# Patient Record
Sex: Male | Born: 1955 | Race: White | Hispanic: No | Marital: Married | Smoking: Current some day smoker
Health system: Southern US, Community
[De-identification: ages and names within clinical notes are randomized; demographics above are authoritative.]

## PROBLEM LIST (undated history)

## (undated) DIAGNOSIS — N400 Enlarged prostate without lower urinary tract symptoms: Secondary | ICD-10-CM

## (undated) DIAGNOSIS — I10 Essential (primary) hypertension: Secondary | ICD-10-CM

## (undated) DIAGNOSIS — C801 Malignant (primary) neoplasm, unspecified: Secondary | ICD-10-CM

## (undated) DIAGNOSIS — M1990 Unspecified osteoarthritis, unspecified site: Secondary | ICD-10-CM

## (undated) DIAGNOSIS — F329 Major depressive disorder, single episode, unspecified: Secondary | ICD-10-CM

## (undated) DIAGNOSIS — F32A Depression, unspecified: Secondary | ICD-10-CM

## (undated) DIAGNOSIS — C61 Malignant neoplasm of prostate: Secondary | ICD-10-CM

## (undated) DIAGNOSIS — J189 Pneumonia, unspecified organism: Secondary | ICD-10-CM

## (undated) HISTORY — DX: Malignant neoplasm of prostate: C61

## (undated) HISTORY — DX: Benign prostatic hyperplasia without lower urinary tract symptoms: N40.0

## (undated) HISTORY — DX: Unspecified osteoarthritis, unspecified site: M19.90

---

## 2008-12-23 ENCOUNTER — Ambulatory Visit (HOSPITAL_COMMUNITY): Admission: RE | Admit: 2008-12-23 | Discharge: 2008-12-23 | Payer: Self-pay | Admitting: Urology

## 2008-12-31 ENCOUNTER — Ambulatory Visit: Admission: RE | Admit: 2008-12-31 | Discharge: 2009-01-29 | Payer: Self-pay | Admitting: Radiation Oncology

## 2008-12-31 ENCOUNTER — Ambulatory Visit: Payer: Self-pay | Admitting: Oncology

## 2009-01-09 LAB — CBC WITH DIFFERENTIAL/PLATELET
BASO%: 0.3 % (ref 0.0–2.0)
Basophils Absolute: 0 10*3/uL (ref 0.0–0.1)
EOS%: 3.2 % (ref 0.0–7.0)
Eosinophils Absolute: 0.2 10*3/uL (ref 0.0–0.5)
HCT: 41.5 % (ref 38.4–49.9)
HGB: 14.9 g/dL (ref 13.0–17.1)
LYMPH%: 25.6 % (ref 14.0–49.0)
MCH: 30.4 pg (ref 27.2–33.4)
MCHC: 35.9 g/dL (ref 32.0–36.0)
MCV: 84.7 fL (ref 79.3–98.0)
MONO#: 0.3 10*3/uL (ref 0.1–0.9)
MONO%: 5.2 % (ref 0.0–14.0)
NEUT#: 3.6 10*3/uL (ref 1.5–6.5)
NEUT%: 65.7 % (ref 39.0–75.0)
Platelets: 118 10*3/uL — ABNORMAL LOW (ref 140–400)
RBC: 4.9 10*6/uL (ref 4.20–5.82)
RDW: 12.8 % (ref 11.0–14.6)
WBC: 5.4 10*3/uL (ref 4.0–10.3)
lymph#: 1.4 10*3/uL (ref 0.9–3.3)

## 2009-01-09 LAB — COMPREHENSIVE METABOLIC PANEL
ALT: 31 U/L (ref 0–53)
AST: 18 U/L (ref 0–37)
Albumin: 4.5 g/dL (ref 3.5–5.2)
Alkaline Phosphatase: 98 U/L (ref 39–117)
BUN: 18 mg/dL (ref 6–23)
CO2: 25 mEq/L (ref 19–32)
Calcium: 9.8 mg/dL (ref 8.4–10.5)
Chloride: 104 mEq/L (ref 96–112)
Creatinine, Ser: 0.94 mg/dL (ref 0.40–1.50)
Glucose, Bld: 97 mg/dL (ref 70–99)
Potassium: 4.2 mEq/L (ref 3.5–5.3)
Sodium: 141 mEq/L (ref 135–145)
Total Bilirubin: 0.5 mg/dL (ref 0.3–1.2)
Total Protein: 6.9 g/dL (ref 6.0–8.3)

## 2009-01-09 LAB — PSA: PSA: 28.07 ng/mL — ABNORMAL HIGH (ref 0.10–4.00)

## 2009-01-09 LAB — TESTOSTERONE: Testosterone: 266.33 ng/dL — ABNORMAL LOW (ref 350–890)

## 2009-07-07 ENCOUNTER — Ambulatory Visit: Payer: Self-pay | Admitting: Oncology

## 2011-02-09 ENCOUNTER — Other Ambulatory Visit (HOSPITAL_COMMUNITY): Payer: Self-pay | Admitting: Urology

## 2011-02-09 DIAGNOSIS — C61 Malignant neoplasm of prostate: Secondary | ICD-10-CM

## 2011-02-23 ENCOUNTER — Encounter (HOSPITAL_COMMUNITY): Payer: Self-pay

## 2011-02-23 ENCOUNTER — Encounter (HOSPITAL_COMMUNITY)
Admission: RE | Admit: 2011-02-23 | Discharge: 2011-02-23 | Disposition: A | Discharge status: Home / Self Care | Payer: PRIVATE HEALTH INSURANCE | Source: Ambulatory Visit | Attending: Urology | Admitting: Urology

## 2011-02-23 DIAGNOSIS — M545 Low back pain, unspecified: Secondary | ICD-10-CM | POA: Insufficient documentation

## 2011-02-23 DIAGNOSIS — C7952 Secondary malignant neoplasm of bone marrow: Secondary | ICD-10-CM | POA: Insufficient documentation

## 2011-02-23 DIAGNOSIS — C7951 Secondary malignant neoplasm of bone: Secondary | ICD-10-CM | POA: Insufficient documentation

## 2011-02-23 DIAGNOSIS — C61 Malignant neoplasm of prostate: Secondary | ICD-10-CM

## 2011-02-23 HISTORY — DX: Malignant (primary) neoplasm, unspecified: C80.1

## 2011-02-23 MED ORDER — TECHNETIUM TC 99M MEDRONATE IV KIT
25.0000 | PACK | Freq: Once | INTRAVENOUS | Status: AC | PRN
  Administered 2011-02-23: 23.1 via INTRAVENOUS

## 2011-11-19 ENCOUNTER — Other Ambulatory Visit (HOSPITAL_COMMUNITY): Payer: Self-pay | Admitting: Urology

## 2011-11-19 DIAGNOSIS — C61 Malignant neoplasm of prostate: Secondary | ICD-10-CM

## 2011-12-17 ENCOUNTER — Encounter (HOSPITAL_COMMUNITY)
Admission: RE | Admit: 2011-12-17 | Discharge: 2011-12-17 | Disposition: A | Discharge status: Home / Self Care | Payer: Managed Care, Other (non HMO) | Source: Ambulatory Visit | Attending: Urology | Admitting: Urology

## 2011-12-17 ENCOUNTER — Encounter (HOSPITAL_COMMUNITY): Payer: Self-pay

## 2011-12-17 DIAGNOSIS — C7951 Secondary malignant neoplasm of bone: Secondary | ICD-10-CM | POA: Insufficient documentation

## 2011-12-17 DIAGNOSIS — C61 Malignant neoplasm of prostate: Secondary | ICD-10-CM | POA: Insufficient documentation

## 2011-12-17 MED ORDER — TECHNETIUM TC 99M MEDRONATE IV KIT
23.3000 | PACK | Freq: Once | INTRAVENOUS | Status: AC | PRN
  Administered 2011-12-17: 23.3 via INTRAVENOUS

## 2012-01-11 ENCOUNTER — Telehealth: Payer: Self-pay | Admitting: Oncology

## 2012-01-11 NOTE — Telephone Encounter (Signed)
S/w pt re appt for 2/28 @ 1:30 pm. Jeanene Erb [phone].

## 2012-01-12 ENCOUNTER — Other Ambulatory Visit: Payer: Self-pay | Admitting: Oncology

## 2012-01-12 DIAGNOSIS — C61 Malignant neoplasm of prostate: Secondary | ICD-10-CM

## 2012-01-13 ENCOUNTER — Ambulatory Visit (HOSPITAL_BASED_OUTPATIENT_CLINIC_OR_DEPARTMENT_OTHER): Payer: Managed Care, Other (non HMO) | Admitting: Oncology

## 2012-01-13 ENCOUNTER — Other Ambulatory Visit: Payer: Managed Care, Other (non HMO)

## 2012-01-13 ENCOUNTER — Ambulatory Visit (HOSPITAL_BASED_OUTPATIENT_CLINIC_OR_DEPARTMENT_OTHER): Payer: Managed Care, Other (non HMO)

## 2012-01-13 VITALS — BP 130/73 | HR 68 | Temp 97.3°F | Ht 74.0 in | Wt 211.4 lb

## 2012-01-13 DIAGNOSIS — C61 Malignant neoplasm of prostate: Secondary | ICD-10-CM

## 2012-01-13 LAB — CBC WITH DIFFERENTIAL/PLATELET
BASO%: 0.2 % (ref 0.0–2.0)
Basophils Absolute: 0 10*3/uL (ref 0.0–0.1)
EOS%: 1.6 % (ref 0.0–7.0)
Eosinophils Absolute: 0.1 10*3/uL (ref 0.0–0.5)
HCT: 41.8 % (ref 38.4–49.9)
HGB: 14.6 g/dL (ref 13.0–17.1)
LYMPH%: 24.1 % (ref 14.0–49.0)
MCH: 30.2 pg (ref 27.2–33.4)
MCHC: 35 g/dL (ref 32.0–36.0)
MCV: 86.3 fL (ref 79.3–98.0)
MONO#: 0.2 10*3/uL (ref 0.1–0.9)
MONO%: 3.4 % (ref 0.0–14.0)
NEUT#: 4.3 10*3/uL (ref 1.5–6.5)
NEUT%: 70.7 % (ref 39.0–75.0)
Platelets: 137 10*3/uL — ABNORMAL LOW (ref 140–400)
RBC: 4.84 10*6/uL (ref 4.20–5.82)
RDW: 13.4 % (ref 11.0–14.6)
WBC: 6.2 10*3/uL (ref 4.0–10.3)
lymph#: 1.5 10*3/uL (ref 0.9–3.3)

## 2012-01-13 LAB — COMPREHENSIVE METABOLIC PANEL
ALT: 31 U/L (ref 0–53)
AST: 25 U/L (ref 0–37)
Albumin: 4.6 g/dL (ref 3.5–5.2)
Alkaline Phosphatase: 75 U/L (ref 39–117)
BUN: 20 mg/dL (ref 6–23)
CO2: 25 mEq/L (ref 19–32)
Calcium: 9.6 mg/dL (ref 8.4–10.5)
Chloride: 102 mEq/L (ref 96–112)
Creatinine, Ser: 0.91 mg/dL (ref 0.50–1.35)
Glucose, Bld: 118 mg/dL — ABNORMAL HIGH (ref 70–99)
Potassium: 3.7 mEq/L (ref 3.5–5.3)
Sodium: 139 mEq/L (ref 135–145)
Total Bilirubin: 0.5 mg/dL (ref 0.3–1.2)
Total Protein: 7.2 g/dL (ref 6.0–8.3)

## 2012-01-13 LAB — PSA: PSA: 1.95 ng/mL (ref <=4.00)

## 2012-01-13 LAB — TESTOSTERONE: Testosterone: 15.5 ng/dL — ABNORMAL LOW (ref 250–890)

## 2012-01-13 NOTE — Progress Notes (Signed)
CC:   Heloise Purpura, MD  REASON FOR CONSULTATION:  Prostate cancer.  HISTORY OF PRESENT ILLNESS:  [name] is a pleasant 56 year old gentleman whom I saw over 2 years ago for evaluation for prostate cancer.  To summarize his history, dating back to 2010, where he was found to have an elevated PSA up to 15 and subsequently underwent a biopsy which showed a Gleason score 4 + 5 equals 9 in the right and the left apex of his prostate, indicating a poorly-differentiated adenocarcinoma of the prostate.  At that time, his staging workup did show sclerotic lesions in T7 and L2, indicating a hormone-sensitive prostate cancer.  The patient was treated with Lupron 30 mg every 4 months with Casodex at 50 mg daily, as well as monthly Zometa.  When I saw him back in February 2010, I concurred with that plan at that time. So since that time, he had an excellent response in his PSA with PSA dropping down to less than 1.  However, since October 2011, he had a slow rise in his PSA.  In April 2012, his PSA was 0.23, in August 2012 was 0.57, and it was up to 1.42, then subsequently to 1.91 on December 16, 2011.  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 That was compared to previous scan in February 2010.  A CT scan abdomen and pelvis did not really show any adenopathy or any visceral metastasis.  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  Upon interviewing Gregory Schneider, he is relatively asymptomatic.  He does have some occasional osseous pain, especially in the morning in his lower back and lower extremity.   He takes nonsteroidal anti-inflammatory with excellent response.  He has continued to perform activities of daily living, still runs a landscaping business.  Has not had any major decline in his energy or performance status.  REVIEW OF SYSTEMS:  He does not report any headaches, blurry vision, double vision.  Does not report any motor or sensory neuropathy.  Does not report any alteration in mental status.  Does not report any psychiatric issues or depression.  Does not report any fever, chills, sweats.  Does not report any cough, hemoptysis, hematemesis.  No nausea or vomiting.  No abdominal pain.  No hematochezia or melena or genitourinary complaints.  Rest of review of systems is unremarkable.  PAST MEDICAL HISTORY:  Past medical history was re-reviewed today and he has a history of benign prostatic hypertrophy, history of arthritis, and mild obesity.  CURRENT MEDICATIONS:  He is on Zoloft, multivitamin, vitamin D, and ibuprofen.  He is currently on Lupron 30 mg every 4 months, as well as monthly Zometa.  ALLERGIES:  None.  FAMILY HISTORY:  Significant for diabetes.  He has a grandfather who had prostate cancer.  SOCIAL HISTORY:  He is married.  He has his own business.  Drinks heavily, about 4 to 5 beers a day.  Does not report any smoking history.  PHYSICAL EXAMINATION:  General:  Alert, awake gentleman, appeared in no active distress today.  Vital Signs:  His blood pressure is 130/73, pulse 68, respiration 20, weighs close  to 300 pounds.  ECOG performance status is 1.  HEENT:  Head is normocephalic, atraumatic.  Pupils equal, round, reactive to light.  Oral mucosa moist and pink.  Neck:  Supple without adenopathy.  Heart:  Regular rate and rhythm, S1 and S2.  Lungs: Clear to auscultation without rhonchi, wheezes, or dullness to percussion.  Abdomen:  Soft, nontender.  No hepatosplenomegaly. Extremities:  No clubbing, cyanosis, or edema.  Neurologically:  Intact motor,  sensory, and deep tendon reflexes.  LABORATORY DATA:  Today showed a hemoglobin of 14.6, white cell count of 6.2, platelet count 137.  His last PSA from what I can see was 1.98 on November 19, 2011, although per his report after stopping bicalutamide did drop down to about 1.1 or so.  ASSESSMENT AND PLAN:  This is a pleasant 56 year old gentleman with the following issues: 1. 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  However, I am confirming that by obtaining a     testosterone level, as well as a PSA today.  If he indeed has     castration resistance which is evident also by progression of     disease in his bony metastasis, then there are few treatment     options that could be offered to Mr. Greenfeld today.  We discussed     today multiple options, including second-line hormone manipulation     with ketoconazole and prednisone, as well as a treatment with a     more potent hormonal agent such as Zytiga, which has been approved     in pre-chemotherapy patients.  I also talked to him about systemic     chemotherapy in the form of Taxotere plus-or-minus investigational     agent.  Also I talked to him in detail about Provenge     immunotherapy.  I have discussed the risks and benefits of each     individual treatment in detail today, as well as written     information was given to the patient.  My preference at this point,     I communicated with Mr. Pruett that I think chemotherapy could be     delayed to a later date.  I feel that either second-line hormone     manipulation with ketoconazole, prednisone, and Zytiga versus     Provenge are a reasonable option on at this point.  He is     interested in Provenge.  I discussed with him again details and     logistics of Provenge administration, including apheresis process,     the vaccine re-administration, the need for good  veins and possible     pheresis catheter.  All these were discussed in detail today and     the plan is for him to think about it and let me know in the near     future.  He seems to be leaning towards Provenge.  I will check his     insurance coverage and make sure he has a reasonable co-pay and if     he does not, then alternatively ketoconazole and Zytiga will be a     reasonable option. 2. Hormonal deprivation.  I will continue his Lupron.  He already has     an appointment in [facility], which I encouraged him to keep     it, to keep the Lupron every 4 months.  3. Bony protective agents.  He is already on Zometa from that     standpoint.  He is also on calcium and vitamin D supplements.    ______________________________ Benjiman Core, M.D. FNS/MEDQ  D:  01/13/2012  T:  01/13/2012  Job:  [id number]

## 2012-01-13 NOTE — Progress Notes (Signed)
Note dictated

## 2012-01-14 ENCOUNTER — Encounter: Payer: Self-pay | Admitting: *Deleted

## 2012-01-14 NOTE — Progress Notes (Unsigned)
Gave Gregory Schneider patient's name for possible provenge.

## 2012-02-10 ENCOUNTER — Encounter: Payer: Self-pay | Admitting: Oncology

## 2012-02-10 NOTE — Progress Notes (Unsigned)
Completed [employer] enrollment form online.  Gregory Schneider will come by today and sign the form. 02/14/12 Wife called and I called her back [phone] and told her I spoke to Terrell Hills last week and he was supposed to come and sign the forms on Thursday. I faxed to papers to their house per Melanie's instruction to [phone].  She said she will send them back.

## 2012-02-14 ENCOUNTER — Telehealth: Payer: Self-pay | Admitting: *Deleted

## 2012-02-14 NOTE — Telephone Encounter (Signed)
Received message from wife Tamara Kenyon wanting to know status of paper work for pt to receive  Immunotherapy.   Thelma Barge,  CM  Notified  For f/u with wife. Melanie's   Phone       [phone].

## 2012-02-23 ENCOUNTER — Other Ambulatory Visit: Payer: Self-pay | Admitting: Oncology

## 2012-02-23 ENCOUNTER — Telehealth: Payer: Self-pay | Admitting: *Deleted

## 2012-02-23 ENCOUNTER — Other Ambulatory Visit: Payer: Self-pay | Admitting: Radiology

## 2012-02-23 DIAGNOSIS — C801 Malignant (primary) neoplasm, unspecified: Secondary | ICD-10-CM | POA: Insufficient documentation

## 2012-02-23 DIAGNOSIS — C61 Malignant neoplasm of prostate: Secondary | ICD-10-CM

## 2012-02-23 NOTE — Telephone Encounter (Signed)
Tina in [employer] Calling to say she has set up patient for pheresis catheter for tomorrow 02-24-12, @ 1:30. routed order to dr Clelia Croft for his signature, patient aware of procedure and has instructions.

## 2012-02-24 ENCOUNTER — Ambulatory Visit (HOSPITAL_COMMUNITY)
Admission: RE | Admit: 2012-02-24 | Discharge: 2012-02-24 | Disposition: A | Discharge status: Home / Self Care | Payer: Managed Care, Other (non HMO) | Source: Ambulatory Visit | Attending: Oncology | Admitting: Oncology

## 2012-02-24 ENCOUNTER — Other Ambulatory Visit: Payer: Self-pay | Admitting: Oncology

## 2012-02-24 DIAGNOSIS — C61 Malignant neoplasm of prostate: Secondary | ICD-10-CM

## 2012-02-24 DIAGNOSIS — E669 Obesity, unspecified: Secondary | ICD-10-CM | POA: Insufficient documentation

## 2012-02-24 DIAGNOSIS — Z881 Allergy status to other antibiotic agents status: Secondary | ICD-10-CM | POA: Insufficient documentation

## 2012-02-24 LAB — CBC
HCT: 39 % (ref 39.0–52.0)
Hemoglobin: 13.7 g/dL (ref 13.0–17.0)
MCH: 30 pg (ref 26.0–34.0)
MCHC: 35.1 g/dL (ref 30.0–36.0)
MCV: 85.3 fL (ref 78.0–100.0)
Platelets: 120 10*3/uL — ABNORMAL LOW (ref 150–400)
RBC: 4.57 MIL/uL (ref 4.22–5.81)
RDW: 13.6 % (ref 11.5–15.5)
WBC: 6.3 10*3/uL (ref 4.0–10.5)

## 2012-02-24 LAB — APTT: aPTT: 27 seconds (ref 24–37)

## 2012-02-24 LAB — PROTIME-INR
INR: 0.91 (ref 0.00–1.49)
Prothrombin Time: 12.5 seconds (ref 11.6–15.2)

## 2012-02-24 MED ORDER — FENTANYL CITRATE 0.05 MG/ML IJ SOLN
INTRAMUSCULAR | Status: AC
  Filled 2012-02-24: qty 2

## 2012-02-24 MED ORDER — SODIUM CHLORIDE 0.9 % IV SOLN: Freq: Once | INTRAVENOUS | Status: DC

## 2012-02-24 MED ORDER — MIDAZOLAM HCL 2 MG/2ML IJ SOLN
INTRAMUSCULAR | Status: AC
  Filled 2012-02-24: qty 4

## 2012-02-24 MED ORDER — MIDAZOLAM HCL 5 MG/5ML IJ SOLN
INTRAMUSCULAR | Status: AC | PRN
  Administered 2012-02-24: 2 mg via INTRAVENOUS

## 2012-02-24 MED ORDER — LIDOCAINE HCL 1 % IJ SOLN
INTRAMUSCULAR | Status: AC
  Filled 2012-02-24: qty 20

## 2012-02-24 MED ORDER — HEPARIN SOD (PORK) LOCK FLUSH 100 UNIT/ML IV SOLN
410.0000 [IU] | Freq: Once | INTRAVENOUS | Status: AC
  Administered 2012-02-24: 410 [IU] via INTRAVENOUS

## 2012-02-24 MED ORDER — VANCOMYCIN HCL IN DEXTROSE 1-5 GM/200ML-% IV SOLN
INTRAVENOUS | Status: AC
  Filled 2012-02-24: qty 200

## 2012-02-24 MED ORDER — HEPARIN SODIUM (PORCINE) 1000 UNIT/ML IJ SOLN
INTRAMUSCULAR | Status: AC
  Filled 2012-02-24: qty 1

## 2012-02-24 MED ORDER — FENTANYL CITRATE 0.05 MG/ML IJ SOLN
INTRAMUSCULAR | Status: AC | PRN
  Administered 2012-02-24: 100 ug via INTRAVENOUS

## 2012-02-24 MED ORDER — VANCOMYCIN HCL IN DEXTROSE 1-5 GM/200ML-% IV SOLN
1000.0000 mg | INTRAVENOUS | Status: AC
  Administered 2012-02-24: 1000 mg via INTRAVENOUS

## 2012-02-24 NOTE — Discharge Instructions (Signed)
Moderate Sedation, Adult Moderate sedation is given to help you relax or even sleep through a procedure. You may remain sleepy, be clumsy, or have poor balance for several hours following this procedure. Arrange for a responsible adult, family member, or friend to take you home. A responsible adult should stay with you for at least 24 hours or until the medicines have worn off.  Do not participate in any activities where you could become injured for the next 24 hours, or until you feel normal again. Do not:   Drive.   Swim.   Ride a bicycle.   Operate heavy machinery.   Cook.   Use power tools.   Climb ladders.   Work at [employer].   Do not make important decisions or sign legal documents until you are improved.   Vomiting may occur if you eat too soon. When you can drink without vomiting, try water, juice, or soup. Try solid foods if you feel little or no nausea.   Only take over-the-counter or prescription medications for pain, discomfort, or fever as directed by your caregiver.If pain medications have been prescribed for you, ask your caregiver how soon it is safe to take them.   Make sure you and your family fully understands everything about the medication given to you. Make sure you understand what side effects may occur.   You should not drink alcohol, take sleeping pills, or medications that cause drowsiness for at least 24 hours.   If you smoke, do not smoke alone.   If you are feeling better, you may resume normal activities 24 hours after receiving sedation.   Keep all appointments as scheduled. Follow all instructions.   Ask questions if you do not understand.  SEEK MEDICAL CARE IF:   Your skin is pale or bluish in color.   You continue to feel sick to your stomach (nauseous) or throw up (vomit).   Your pain is getting worse and not helped by medication.   You have bleeding or swelling.   You are still sleepy or feeling clumsy after 24 hours.  SEEK IMMEDIATE  MEDICAL CARE IF:   You develop a rash.   You have difficulty breathing.   You develop any type of allergic problem.   You have a fever.  Document Released: 07/27/2001 Document Revised: 10/21/2011 Document Reviewed: 12/18/2007 [facility] Patient Information 2012 [place], [place]. Central Lines A central line is a soft, flexible tube that is placed because you need intravenous (IV) access. The tip of the central line ends in a large vein (superior vena cava) just above the heart. Because the blood flow within this vein is so great, medicine that is given through the central line is quickly mixed with blood. This dilutes the medicine so it is swiftly delivered throughout your body. Insertion of any type of central line is a sterile procedure.  A central line may be placed because:  You need medication that would be irritating to the small veins in your hands or arms.   You need long-term intravenous (IV) antibiotics or other IV drugs.   You need nutrition delivered through a central line.   You have veins in your hands or arms that are difficult to access.   You need frequent blood draws for lab tests.  TYPES OF CENTRAL LINE DEVICES There are different types of central lines. The type of central line you get depends on how long it will be needed, your medical condition, the condition of your veins and what  type central line is best for you.  The types of central lines include:  Peripherally Inserted Central Catheter (PICC) lines are usually inserted in the upper arm.   Ultrasound is used to guide insertion of the PICC line.   Used for intermediate to long term access.   Usually inserted by a specially trained nurse.   Non-tunneled central lines are inserted in the neck, chest or groin.   Used for the short-term access, usually 7 days.   Can be inserted in the patient's room.   Can have a high infection rate.   Tunneled central lines are normally inserted in the upper chest area.    Used for long term therapy.   Are inserted and removed surgically in a special procedure room.   Different types of tunneled central lines are Hickman Catheter, Broviac Catheter, Groshong Catheter.   Can be used for dialysis.   Implanted ports are normally inserted in the upper chest but can be placed in the upper arm or in the stomach area (abdomen).   Used for long term therapy.   Are inserted and removed surgically in a special procedure room.   Your skin heals over the port insertion site.   The port must be accessed by a special "portacath" needle.  HOME CARE INSTRUCTIONS  Follow your caregivers specific instructions for the type of device that you have.  If you have a dressing over your central line, be sure to keep it clean and dry.   Keep the insertion site of your central line clean at all times.   Change your dressing over the central line as instructed by your caregiver. Wash your hands with soap and water before changing the dressing. In order to know you have washed for a long enough time, sing "Happy [place] Ouch" while doing so. When you are finished singing to yourself, you have washed for the right amount of time.   When you change the dressing, check for redness or irritation. Call your caregiver if there is pus-like discharge, redness, swelling, or discomfort where the central line enters the skin.   Flush your line as instructed to keep it open (patent).   If the central line accidentally gets pulled, check to make sure the dressing is okay. If there is bleeding or the line looks like it has been pulled out, call your caregiver.   Limit lifting, using your arm or other activity as told by your caregiver.   Swim or bathe only if given permission. Your caregiver can instruct you on how to keep your specific type of dressing from getting wet.  RISKS AND COMPLICATIONS Any type of central line has risks that you should be aware of. Some of these  include:  Infection.   Clotting of the central line.   Bleeding from the central line insertion site.   Developing a hole or crack within the central line. If this happens, the central line will need to be replaced.   In rare cases, you can develop an abnormal heart rhythm. Let your caregiver know right away if you feel your heart beating rapidly or "skipping" beats.   When a central line is being inserted in the chest area, a collapsed lung (pneumothorax) can occur. This is a rare problem.  SEEK IMMEDIATE MEDICAL CARE IF:   You develop chills or a fever of 100.5 F or greater.   You develop shortness of breath or difficulty breathing.   You develop chest pain.   You feel your  heart beating rapidly or "skipping" beats.   You develop dizziness or you pass out.   You develop bleeding from the insertion site that does not stop.   There is pain, redness, swelling or drainage at the insertion site of the central line.   You have swelling in your neck, face, chest or arm on the side of your central line.   The central line is not working properly such as:   It will not flush.   You do not get a blood return from the central line.   You develop a hole or tear in the catheter.  MAKE SURE YOU:   Understand these instructions.   Will watch your condition.   Will get help right away if you are not doing well or get worse.  Document Released: 12/23/2005 Document Revised: 10/21/2011 Document Reviewed: 06/19/2008 [facility] Patient Information 2012 [place], [place].

## 2012-02-24 NOTE — Discharge Instructions (Signed)
Keep your dressing clean, dry, and intact. Keep catheters clamped. Oncology center to flush catheters. Call for any elevated temperatures, pain at catheter site, and drainage.

## 2012-02-24 NOTE — H&P (Signed)
Gregory Schneider is an 56 y.o. male.   Chief Complaint: prostate cancer HPI: Patient with history of castration resistant prostate carcinoma presents today for pheresis catheter placement to accomodate Provenge immunotherapy.  Past Medical History  Diagnosis Date  . Cancer   obesity, arthritis  No past surgical history on file. Family History: sig for diabetes, grandfather with prostate cancer Social History: married, works in Aeronautical engineer business, positive alcohol use, does not smoke Allergies:  Allergies  Allergen Reactions  . Penicillins     Medications Prior to Admission  Medication Sig Dispense Refill  . cholecalciferol (VITAMIN Gregory) 1000 UNITS tablet Take 1,000 Units by mouth daily.      Marland Kitchen ibuprofen (ADVIL,MOTRIN) 200 MG tablet Take 200 mg by mouth every 6 (six) hours as needed.      . Multiple Vitamin (MULTIVITAMIN) tablet Take 1 tablet by mouth daily.      . sertraline (ZOLOFT) 100 MG tablet Take 100 mg by mouth daily.      . Zoledronic Acid (ZOMETA) 4 MG/100ML IVPB Inject 4 mg into the vein every 30 (thirty) days.      Marland Kitchen leuprolide (LUPRON) 30 MG injection Inject 30 mg into the muscle every 4 (four) months.       Medications Prior to Admission  Medication Dose Route Frequency Provider Last Rate Last Dose  . 0.9 %  sodium chloride infusion   Intravenous Once Gregory El, PA      . vancomycin (VANCOCIN) 1 GM/200ML IVPB           . vancomycin (VANCOCIN) IVPB 1000 mg/200 mL premix  1,000 mg Intravenous On Call Gregory El, PA        Results for orders placed during the hospital encounter of 02/24/12 (from the past 48 hour(s))  APTT     Status: Normal   Collection Time   02/24/12 12:35 PM      Component Value Range Comment   aPTT 27  24 - 37 (seconds)   CBC     Status: Abnormal   Collection Time   02/24/12 12:35 PM      Component Value Range Comment   WBC 6.3  4.0 - 10.5 (K/uL)    RBC 4.57  4.22 - 5.81 (MIL/uL)    Hemoglobin 13.7  13.0 - 17.0 (g/dL)    HCT 45.4  09.8 -  11.9 (%)    MCV 85.3  78.0 - 100.0 (fL)    MCH 30.0  26.0 - 34.0 (pg)    MCHC 35.1  30.0 - 36.0 (g/dL)    RDW 14.7  82.9 - 56.2 (%)    Platelets 120 (*) 150 - 400 (K/uL)   PROTIME-INR     Status: Normal   Collection Time   02/24/12 12:35 PM      Component Value Range Comment   Prothrombin Time 12.5  11.6 - 15.2 (seconds)    INR 0.91  0.00 - 1.49     No results found.  Review of Systems  Constitutional: Negative for fever and chills.  Respiratory: Negative for cough and shortness of breath.   Cardiovascular: Negative for chest pain.  Gastrointestinal: Negative for nausea, vomiting and abdominal pain.  Genitourinary: Negative for dysuria and hematuria.  Musculoskeletal: Positive for back pain.  Neurological: Negative for headaches.  Endo/Heme/Allergies: Does not bruise/bleed easily.    Blood pressure 139/74, pulse 55, temperature 98.5 F (36.9 C), resp. rate 21, SpO2 99.00%. Physical Exam  Constitutional: He is oriented to person, place, and time. He  appears well-developed and well-nourished.  Cardiovascular: Normal rate and regular rhythm.   Respiratory: Effort normal and breath sounds normal.  GI: Soft. Bowel sounds are normal. There is no tenderness.       obese  Musculoskeletal: Normal range of motion. He exhibits no edema.  Neurological: He is alert and oriented to person, place, and time.     Assessment/Plan Patient with castration resistant prostate carcinoma; plan is for pheresis catheter placement for Provenge immunotherapy. Details/risks of procedure Gregory/w pt with his understanding and consent.  Gregory Schneider,Gregory Schneider 02/24/2012, 1:09 PM

## 2012-02-24 NOTE — Procedures (Signed)
Placement of right IJ tunneled pheresis catheter.  No immediate complication.

## 2012-02-29 ENCOUNTER — Telehealth: Payer: Self-pay | Admitting: *Deleted

## 2012-02-29 NOTE — Telephone Encounter (Signed)
Natalie from [facility] calling to say that patient has been changing his own pheresis catheter dressing at home. 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 Reported this to tina, scheduler in IR and asked her to fax me a copy of patient d/c instructions, so i may reiterate catheter care and dressing changes after placement. Note to dr Clelia Croft.

## 2012-03-03 ENCOUNTER — Encounter: Payer: Self-pay | Admitting: Oncology

## 2012-03-03 ENCOUNTER — Ambulatory Visit (HOSPITAL_BASED_OUTPATIENT_CLINIC_OR_DEPARTMENT_OTHER): Payer: Managed Care, Other (non HMO)

## 2012-03-03 ENCOUNTER — Ambulatory Visit (HOSPITAL_BASED_OUTPATIENT_CLINIC_OR_DEPARTMENT_OTHER): Payer: Managed Care, Other (non HMO) | Admitting: Oncology

## 2012-03-03 VITALS — BP 141/79 | HR 57 | Temp 97.9°F

## 2012-03-03 VITALS — BP 139/85 | HR 61 | Temp 97.9°F | Ht 74.0 in | Wt 316.6 lb

## 2012-03-03 DIAGNOSIS — C61 Malignant neoplasm of prostate: Secondary | ICD-10-CM

## 2012-03-03 DIAGNOSIS — C7951 Secondary malignant neoplasm of bone: Secondary | ICD-10-CM

## 2012-03-03 DIAGNOSIS — C7952 Secondary malignant neoplasm of bone marrow: Secondary | ICD-10-CM

## 2012-03-03 DIAGNOSIS — C801 Malignant (primary) neoplasm, unspecified: Secondary | ICD-10-CM

## 2012-03-03 DIAGNOSIS — N400 Enlarged prostate without lower urinary tract symptoms: Secondary | ICD-10-CM | POA: Insufficient documentation

## 2012-03-03 HISTORY — DX: Malignant neoplasm of prostate: C61

## 2012-03-03 MED ORDER — DIPHENHYDRAMINE HCL 50 MG/ML IJ SOLN
50.0000 mg | Freq: Once | INTRAMUSCULAR | Status: AC
  Administered 2012-03-03: 50 mg via INTRAVENOUS

## 2012-03-03 MED ORDER — ACETAMINOPHEN 325 MG PO TABS
650.0000 mg | ORAL_TABLET | Freq: Once | ORAL | Status: AC
  Administered 2012-03-03: 650 mg via ORAL

## 2012-03-03 MED ORDER — HEPARIN SODIUM (PORCINE) 1000 UNIT/ML IJ SOLN
2000.0000 [IU] | Freq: Once | INTRAMUSCULAR | Status: AC
  Administered 2012-03-03: 2000 [IU]

## 2012-03-03 MED ORDER — HEPARIN SODIUM (PORCINE) 1000 UNIT/ML IJ SOLN
2.1000 mL | Freq: Once | INTRAMUSCULAR | Status: AC
  Administered 2012-03-03: 2100 [IU]

## 2012-03-03 MED ORDER — SODIUM CHLORIDE 0.9 % IV SOLN
Freq: Once | INTRAVENOUS | Status: AC
  Administered 2012-03-03: 50 mL via INTRAVENOUS

## 2012-03-03 MED ORDER — SIPULEUCEL-T IV INFUSION
250.0000 mL | Freq: Once | INTRAVENOUS | Status: AC
  Administered 2012-03-03: 250 mL via INTRAVENOUS
  Filled 2012-03-03: qty 250

## 2012-03-03 NOTE — Progress Notes (Signed)
Hematology and Oncology Follow Up Visit  Gregory Schneider [id number] 1956-06-13 56 y.o. 03/03/2012 11:36 AM No primary provider on file.No ref. 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 He had sclerotic lesions in T7 and L2 at the time of diagnosis.  Prior Therapy: Lupron, Casodex, and Zometa  Current therapy: Lupron and Zometa. He is here to begin Provenge today.  Interim History:  Mr. [name] returns today for routine followup prior to his first dose of Provenge. He's had a pheresis catheter placed since we last saw him. Reports some mild itching around the insertion site on the dressing, but no redness or drainage to the catheter insertion site. He denies any pain today. No chest pain, shortness of breath, dyspnea, abdominal pain, nausea, vomiting. He denies any hematuria or difficulty voiding. He has no headaches or dizziness. Appetite and weight are stable. His performance status is at baseline. The patient is ready to begin his prevention has no additional questions today.  Medications: I have reviewed the patient's current medications. Current outpatient prescriptions:cholecalciferol (VITAMIN D) 1000 UNITS tablet, Take 1,000 Units by mouth daily., Disp: , Rfl: ;  ibuprofen (ADVIL,MOTRIN) 200 MG tablet, Take 200 mg by mouth every 6 (six) hours as needed., Disp: , Rfl: ;  leuprolide (LUPRON) 30 MG injection, Inject 30 mg into the muscle every 4 (four) months., Disp: , Rfl: ;  Multiple Vitamin (MULTIVITAMIN) tablet, Take 1 tablet by mouth daily., Disp: , Rfl:  sertraline (ZOLOFT) 100 MG tablet, Take 100 mg by mouth daily., Disp: , Rfl: ;  Zoledronic Acid (ZOMETA) 4 MG/100ML IVPB, Inject 4 mg into the vein every 30 (thirty)  days., Disp: , Rfl:   Allergies:  Allergies  Allergen Reactions  . Penicillins     Past Medical History, Surgical history, Social history, and Family History were reviewed and updated.  Review of Systems: Constitutional:  Negative for fever, chills, night sweats, anorexia, weight loss, pain. Cardiovascular: no chest pain or dyspnea on exertion Respiratory: no cough, shortness of breath, or wheezing Neurological: no TIA or stroke symptoms Dermatological: negative ENT: negative Skin: Negative. Gastrointestinal: no abdominal pain, change in bowel habits, or black or bloody stools Genito-Urinary: no dysuria, trouble voiding, or hematuria Hematological and Lymphatic: negative Breast: negative for breast lumps Musculoskeletal: negative Remaining ROS negative. Physical Exam: Blood pressure 139/85, pulse 61, temperature 97.9 F (36.6 C), temperature source Oral, height 6\' 2"  (1.88 m), weight 316 lb 9.6 oz (143.609 kg). ECOG: 0 General appearance: alert, cooperative and no distress Head: Normocephalic, without obvious abnormality, atraumatic Neck: no adenopathy, no carotid bruit, no JVD, supple, symmetrical, trachea midline and thyroid not enlarged, symmetric, no tenderness/mass/nodules Lymph nodes: Cervical, supraclavicular, and axillary nodes normal. Heart:regular rate and rhythm, S1, S2 normal, no murmur, click, rub or gallop Lung:chest clear, no wheezing, rales, normal symmetric air entry, no tachypnea, retractions or cyanosis Abdomen: soft, non-tender, without masses or organomegaly EXT:no erythema, induration, or nodules   Lab Results: Lab Results  Component Value Date   WBC 6.3 02/24/2012   HGB 13.7 02/24/2012   HCT 39.0 02/24/2012   MCV 85.3 02/24/2012   PLT 120* 02/24/2012     Chemistry      Component Value Date/Time   NA 139  01/13/2012 1340   K 3.7 01/13/2012 1340   CL 102 01/13/2012 1340   CO2 25 01/13/2012 1340   BUN 20 01/13/2012 1340   CREATININE 0.91 01/13/2012 1340        Component Value Date/Time   CALCIUM 9.6 01/13/2012 1340   ALKPHOS 75 01/13/2012 1340   AST 25 01/13/2012 1340   ALT 31 01/13/2012 1340   BILITOT 0.5 01/13/2012 1340     Impression and Plan: This is a 56 year old gentleman with the following issues: 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 The patient is here to begin Provenge. He'll proceed with his first dose today. All of his questions have been answered to his satisfaction. 2. Androgen deprivation. He'll continue his Lupron every 4 months at [facility]. 3. Bony disease. He'll continue Zometa monthly at [facility]. He will continue his calcium and vitamin D supplements. 4. Followup. He'll return next week for pheresis catheter flush and dressing change and again in 2 weeks for his second dose of Provenge.  Case reviewed with Dr Clelia Croft. Spent more than half the time coordinating care.    [place], [place] 4/19/201311:36 AM

## 2012-03-03 NOTE — Patient Instructions (Addendum)
[facility] Discharge Instructions for Patients Receiving Chemotherapy  Today you received the following chemotherapy agents Provenge   To help prevent nausea and vomiting after your treatment, we encourage you to take your nausea medication as prescribed.    If you develop nausea and vomiting that is not controlled by your nausea medication, call the clinic. If it is after clinic hours your family physician or the after hours number for the clinic or go to the Emergency Department.   BELOW ARE SYMPTOMS THAT SHOULD BE REPORTED IMMEDIATELY:  *FEVER GREATER THAN 100.5 F  *CHILLS WITH OR WITHOUT FEVER  NAUSEA AND VOMITING THAT IS NOT CONTROLLED WITH YOUR NAUSEA MEDICATION  *UNUSUAL SHORTNESS OF BREATH  *UNUSUAL BRUISING OR BLEEDING  TENDERNESS IN MOUTH AND THROAT WITH OR WITHOUT PRESENCE OF ULCERS  *URINARY PROBLEMS  *BOWEL PROBLEMS  UNUSUAL RASH Items with * indicate a potential emergency and should be followed up as soon as possible.  One of the nurses will contact you 24 hours after your treatment. Please let the nurse know about any problems that you may have experienced. Feel free to call the clinic you have any questions or concerns. The clinic phone number is [phone].  Due to today being Friday, We will contact you on Monday instead.    I have been informed and understand all the instructions given to me. I know to contact the clinic, my physician, or go to the Emergency Department if any problems should occur. I do not have any questions at this time, but understand that I may call the clinic during office hours   should I have any questions or need assistance in obtaining follow up care.    __________________________________________  _____________  __________ Signature of Patient or Authorized Representative            Date                   Time    __________________________________________ Nurse's Signature

## 2012-03-03 NOTE — Progress Notes (Signed)
Pt. Arrived for Provenge.  Verbal teaching reviewed with pt.  Provenge verified with pt's  license and [facility]  Infusion policy.  Pt. Tolerated infusion well.  Post monitoring done as per protocol.   Post v/s stable.  T 97.9, BP 141/79.   AVS reviewed.  Rt. chest apheresis catheter dressing changed as per policy and heparin instilled to both lumens as per apheresis catheter protocol.

## 2012-03-06 ENCOUNTER — Telehealth: Payer: Self-pay | Admitting: *Deleted

## 2012-03-06 NOTE — Telephone Encounter (Signed)
Message copied by Augusto Garbe on Mon Mar 06, 2012 12:46 PM ------      Message from: Amado Coe      Created: Fri Mar 03, 2012  2:59 PM      Regarding: ZO:XWRUE FOLLOW UP CALL       provenge on Friday 4/19.  Pt. Aware we will call on Monday 4/22.  Will call on-call line prn if needed before then.

## 2012-03-06 NOTE — Telephone Encounter (Signed)
Mr. Balboa is doing well.  Denies experiencing any side effects or problems.  Denies any questions at this time.

## 2012-03-07 ENCOUNTER — Telehealth: Payer: Self-pay | Admitting: Oncology

## 2012-03-07 NOTE — Telephone Encounter (Signed)
Added flush/DRG [facility] appts for 4/26, 5/10 and 5/24 per 4/19 pof. S/w pt he is aware. Other appts remain the same.

## 2012-03-10 ENCOUNTER — Ambulatory Visit (HOSPITAL_BASED_OUTPATIENT_CLINIC_OR_DEPARTMENT_OTHER): Payer: Managed Care, Other (non HMO)

## 2012-03-10 VITALS — BP 132/82 | HR 79

## 2012-03-10 DIAGNOSIS — Z452 Encounter for adjustment and management of vascular access device: Secondary | ICD-10-CM

## 2012-03-10 DIAGNOSIS — C61 Malignant neoplasm of prostate: Secondary | ICD-10-CM

## 2012-03-10 MED ORDER — HEPARIN SOD (PORK) LOCK FLUSH 100 UNIT/ML IV SOLN
500.0000 [IU] | Freq: Once | INTRAVENOUS | Status: AC
  Administered 2012-03-10: 500 [IU] via INTRAVENOUS
  Filled 2012-03-10: qty 5

## 2012-03-10 MED ORDER — SODIUM CHLORIDE 0.9 % IJ SOLN
10.0000 mL | INTRAMUSCULAR | Status: DC | PRN
  Administered 2012-03-10: 10 mL via INTRAVENOUS
  Filled 2012-03-10: qty 10

## 2012-03-17 ENCOUNTER — Ambulatory Visit (HOSPITAL_BASED_OUTPATIENT_CLINIC_OR_DEPARTMENT_OTHER): Payer: Managed Care, Other (non HMO)

## 2012-03-17 ENCOUNTER — Ambulatory Visit (HOSPITAL_BASED_OUTPATIENT_CLINIC_OR_DEPARTMENT_OTHER): Payer: Managed Care, Other (non HMO) | Admitting: Oncology

## 2012-03-17 ENCOUNTER — Encounter: Payer: Self-pay | Admitting: Oncology

## 2012-03-17 ENCOUNTER — Other Ambulatory Visit: Payer: Managed Care, Other (non HMO) | Admitting: Lab

## 2012-03-17 VITALS — BP 139/89 | HR 52 | Temp 97.2°F

## 2012-03-17 DIAGNOSIS — C61 Malignant neoplasm of prostate: Secondary | ICD-10-CM

## 2012-03-17 DIAGNOSIS — Z5112 Encounter for antineoplastic immunotherapy: Secondary | ICD-10-CM

## 2012-03-17 DIAGNOSIS — C801 Malignant (primary) neoplasm, unspecified: Secondary | ICD-10-CM

## 2012-03-17 LAB — CBC WITH DIFFERENTIAL/PLATELET
BASO%: 0.5 % (ref 0.0–2.0)
Basophils Absolute: 0 10*3/uL (ref 0.0–0.1)
EOS%: 3 % (ref 0.0–7.0)
Eosinophils Absolute: 0.2 10*3/uL (ref 0.0–0.5)
HCT: 35.8 % — ABNORMAL LOW (ref 38.4–49.9)
HGB: 12.4 g/dL — ABNORMAL LOW (ref 13.0–17.1)
LYMPH%: 20.3 % (ref 14.0–49.0)
MCH: 29.9 pg (ref 27.2–33.4)
MCHC: 34.7 g/dL (ref 32.0–36.0)
MCV: 86.1 fL (ref 79.3–98.0)
MONO#: 0.3 10*3/uL (ref 0.1–0.9)
MONO%: 5.4 % (ref 0.0–14.0)
NEUT#: 4.3 10*3/uL (ref 1.5–6.5)
NEUT%: 70.8 % (ref 39.0–75.0)
Platelets: 86 10*3/uL — ABNORMAL LOW (ref 140–400)
RBC: 4.16 10*6/uL — ABNORMAL LOW (ref 4.20–5.82)
RDW: 13.3 % (ref 11.0–14.6)
WBC: 6.1 10*3/uL (ref 4.0–10.3)
lymph#: 1.2 10*3/uL (ref 0.9–3.3)
nRBC: 0 % (ref 0–0)

## 2012-03-17 MED ORDER — FAMOTIDINE IN NACL 20-0.9 MG/50ML-% IV SOLN: 20.0000 mg | Freq: Once | INTRAVENOUS | Status: DC | PRN

## 2012-03-17 MED ORDER — DIPHENHYDRAMINE HCL 50 MG/ML IJ SOLN: 50.0000 mg | INTRAMUSCULAR | Status: DC | PRN

## 2012-03-17 MED ORDER — SODIUM CHLORIDE 0.9 % IV SOLN
Freq: Once | INTRAVENOUS | Status: AC
  Administered 2012-03-17: 11:00:00 via INTRAVENOUS

## 2012-03-17 MED ORDER — SIPULEUCEL-T IV INFUSION
250.0000 mL | Freq: Once | INTRAVENOUS | Status: AC
  Administered 2012-03-17: 250 mL via INTRAVENOUS
  Filled 2012-03-17: qty 250

## 2012-03-17 MED ORDER — ACETAMINOPHEN 325 MG PO TABS
650.0000 mg | ORAL_TABLET | Freq: Once | ORAL | Status: AC
  Administered 2012-03-17: 650 mg via ORAL

## 2012-03-17 MED ORDER — MEPERIDINE HCL 25 MG/ML IJ SOLN: 12.5000 mg | INTRAMUSCULAR | Status: DC | PRN

## 2012-03-17 MED ORDER — DIPHENHYDRAMINE HCL 50 MG/ML IJ SOLN
50.0000 mg | Freq: Once | INTRAMUSCULAR | Status: AC
  Administered 2012-03-17: 50 mg via INTRAVENOUS

## 2012-03-17 NOTE — Progress Notes (Signed)
Hematology and Oncology Follow Up Visit  Gregory Schneider [id number] 1956-05-02 56 y.o. 03/17/2012 2:45 PM No primary provider on file.No ref. 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 He had sclerotic lesions in T7 and L2 at the time of diagnosis.  Prior Therapy: Lupron, Casodex, and Zometa  Current therapy: Lupron and Zometa. Provenge started on 03/03/12. Here for dose 2 today.   Interim History:  Gregory Schneider returns today for routine followup prior to his second dose of Provenge. He is seen in the infusion room because he did not know he had a lab appointment and visit with me today. He has a phoresis catheter in place. Over the past few days, both sutures have come out. He states the catheter got caught. Dressing came off last night and no dressing on today. No drainage or pain at insertion site. Reports some mild itching around the insertion site. He denies any pain today. No chest pain, shortness of breath, dyspnea, abdominal pain, nausea, vomiting. He denies any hematuria or difficulty voiding. He has no headaches or dizziness. Appetite and weight are stable. His performance status is at baseline.   Medications: I have reviewed the patient's current medications. Current outpatient prescriptions:cholecalciferol (VITAMIN D) 1000 UNITS tablet, Take 1,000 Units by mouth daily., Disp: , Rfl: ;  ibuprofen (ADVIL,MOTRIN) 200 MG tablet, Take 200 mg by mouth every 6 (six) hours as needed., Disp: , Rfl: ;  leuprolide (LUPRON) 30 MG injection, Inject 30 mg into the muscle every 4 (four) months., Disp: , Rfl: ;  Multiple Vitamin (MULTIVITAMIN) tablet, Take 1 tablet by mouth daily., Disp: , Rfl:  sertraline (ZOLOFT) 100 MG tablet, Take 100  mg by mouth daily., Disp: , Rfl: ;  Zoledronic Acid (ZOMETA) 4 MG/100ML IVPB, Inject 4 mg into the vein every 30 (thirty) days., Disp: , Rfl:  No current facility-administered medications for this visit. Facility-Administered Medications Ordered in Other Visits: 0.9 %  sodium chloride infusion, , Intravenous, Once, Benjiman Core, MD;  acetaminophen (TYLENOL) tablet 650 mg, 650 mg, Oral, Once, Benjiman Core, MD, 650 mg at 03/17/12 1116;  diphenhydrAMINE (BENADRYL) injection 50 mg, 50 mg, Intravenous, Once, Benjiman Core, MD, 50 mg at 03/17/12 1115;  diphenhydrAMINE (BENADRYL) injection 50 mg, 50 mg, Intravenous, PRN, Benjiman Core, MD famotidine (PEPCID) IVPB 20 mg, 20 mg, Intravenous, Once PRN, Benjiman Core, MD;  meperidine (DEMEROL) injection 12.5 mg, 12.5 mg, Intravenous, PRN, Benjiman Core, MD;  sipuleucel-T (PROVENGE) infusion 250 mL, 250 mL, Intravenous, Once, Benjiman Core, MD, 250 mL at 03/17/12 1203  Allergies:  Allergies  Allergen Reactions  . Penicillins     Past Medical History, Surgical history, Social history, and Family History were reviewed and updated.  Review of Systems: Constitutional:  Negative for fever, chills, night sweats, anorexia, weight loss, pain. Cardiovascular: no chest pain or dyspnea on exertion Respiratory: no cough, shortness of breath, or wheezing Neurological: no TIA or stroke symptoms Dermatological: negative ENT: negative Skin: Negative. Gastrointestinal: no abdominal pain, change in bowel habits, or black or bloody stools Genito-Urinary: no dysuria, trouble voiding, or hematuria Hematological and Lymphatic: negative Breast: negative for breast lumps Musculoskeletal: negative Remaining ROS negative.  Physical Exam: Blood pressure 139/89,  pulse 52, temperature 97.2 F (36.2 C). ECOG: 0 General appearance: alert, cooperative and no distress Head: Normocephalic, without obvious abnormality, atraumatic Neck: no adenopathy, no carotid bruit,  no JVD, supple, symmetrical, trachea midline and thyroid not enlarged, symmetric, no tenderness/mass/nodules Lymph nodes: Cervical, supraclavicular, and axillary nodes normal. Heart:regular rate and rhythm, S1, S2 normal, no murmur, click, rub or gallop Lung:chest clear, no wheezing, rales, normal symmetric air entry, no tachypnea, retractions or cyanosis Phoresis cathter in place. No redness or drainage at insertion site. Abdomen: soft, non-tender, without masses or organomegaly EXT:no erythema, induration, or nodules  Lab Results: Lab Results  Component Value Date   WBC 6.1 03/17/2012   HGB 12.4* 03/17/2012   HCT 35.8* 03/17/2012   MCV 86.1 03/17/2012   PLT 86* 03/17/2012     Chemistry      Component Value Date/Time   NA 139 01/13/2012 1340   K 3.7 01/13/2012 1340   CL 102 01/13/2012 1340   CO2 25 01/13/2012 1340   BUN 20 01/13/2012 1340   CREATININE 0.91 01/13/2012 1340      Component Value Date/Time   CALCIUM 9.6 01/13/2012 1340   ALKPHOS 75 01/13/2012 1340   AST 25 01/13/2012 1340   ALT 31 01/13/2012 1340   BILITOT 0.5 01/13/2012 1340     Impression and Plan: This is a 56 year old gentleman with the following issues: 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 On Provenge and here for dose 2. Tolerated first dose well.  2. Androgen deprivation. He'll continue his Lupron every 4 months at [facility]. 3. Bony disease. He'll continue Zometa monthly at [facility]. He will continue his calcium and vitamin D supplements. 4. IV access: Has phoresis catheter in place. Sutures have come out. No signs of infection. Catheter evaluated by IR and in satisfactory position for use today. 4. Followup. He'll return next week for pheresis catheter flush and dressing change and again in 2 weeks for his third dose of Provenge.   Case reviewed with Dr Clelia Croft. Spent more than half the time coordinating care.     [name] 5/3/20132:45 PM

## 2012-03-22 ENCOUNTER — Ambulatory Visit (HOSPITAL_BASED_OUTPATIENT_CLINIC_OR_DEPARTMENT_OTHER): Payer: Managed Care, Other (non HMO)

## 2012-03-22 VITALS — BP 130/80 | HR 66 | Temp 97.7°F

## 2012-03-22 DIAGNOSIS — C61 Malignant neoplasm of prostate: Secondary | ICD-10-CM

## 2012-03-22 DIAGNOSIS — Z452 Encounter for adjustment and management of vascular access device: Secondary | ICD-10-CM

## 2012-03-22 MED ORDER — HEPARIN SODIUM (PORCINE) 1000 UNIT/ML IJ SOLN
2.0000 mL | Freq: Once | INTRAMUSCULAR | Status: AC
  Administered 2012-03-22: 2000 [IU] via INTRAVENOUS

## 2012-03-22 MED ORDER — HEPARIN SODIUM (PORCINE) 1000 UNIT/ML IJ SOLN
2.1000 mL | Freq: Once | INTRAMUSCULAR | Status: AC
  Administered 2012-03-22: 2100 [IU] via INTRAVENOUS

## 2012-03-22 NOTE — Patient Instructions (Signed)
Call MD with any questions 

## 2012-03-29 ENCOUNTER — Telehealth: Payer: Self-pay | Admitting: *Deleted

## 2012-03-29 ENCOUNTER — Other Ambulatory Visit: Payer: Self-pay | Admitting: Oncology

## 2012-03-29 DIAGNOSIS — C61 Malignant neoplasm of prostate: Secondary | ICD-10-CM

## 2012-03-29 NOTE — Telephone Encounter (Signed)
Wife calling to request pheresis catheter be removed 03/31/12, after last provenge tx, per tina in [facility], patient to come directly to [facility] after provenge infusion and they will remove catheter, message left on answering machine for patient and wife. Order to be put in epic by tina for dr Clelia Croft to sign.

## 2012-03-30 ENCOUNTER — Other Ambulatory Visit: Payer: Self-pay | Admitting: Oncology

## 2012-03-30 ENCOUNTER — Telehealth (HOSPITAL_COMMUNITY): Payer: Self-pay | Admitting: Oncology

## 2012-03-30 DIAGNOSIS — C61 Malignant neoplasm of prostate: Secondary | ICD-10-CM

## 2012-03-31 ENCOUNTER — Other Ambulatory Visit: Payer: Self-pay | Admitting: Oncology

## 2012-03-31 ENCOUNTER — Other Ambulatory Visit (HOSPITAL_BASED_OUTPATIENT_CLINIC_OR_DEPARTMENT_OTHER): Payer: Managed Care, Other (non HMO) | Admitting: Lab

## 2012-03-31 ENCOUNTER — Ambulatory Visit (HOSPITAL_BASED_OUTPATIENT_CLINIC_OR_DEPARTMENT_OTHER): Payer: Managed Care, Other (non HMO) | Admitting: Oncology

## 2012-03-31 ENCOUNTER — Ambulatory Visit (HOSPITAL_BASED_OUTPATIENT_CLINIC_OR_DEPARTMENT_OTHER): Payer: Managed Care, Other (non HMO)

## 2012-03-31 ENCOUNTER — Telehealth: Payer: Self-pay | Admitting: Oncology

## 2012-03-31 ENCOUNTER — Ambulatory Visit (HOSPITAL_COMMUNITY)
Admission: RE | Admit: 2012-03-31 | Discharge: 2012-03-31 | Disposition: A | Discharge status: Home / Self Care | Payer: Managed Care, Other (non HMO) | Source: Ambulatory Visit | Attending: Oncology | Admitting: Oncology

## 2012-03-31 VITALS — BP 141/85 | HR 51 | Temp 98.0°F

## 2012-03-31 VITALS — BP 123/77 | HR 67 | Temp 97.0°F | Ht 74.0 in | Wt 320.8 lb

## 2012-03-31 DIAGNOSIS — C7951 Secondary malignant neoplasm of bone: Secondary | ICD-10-CM

## 2012-03-31 DIAGNOSIS — E291 Testicular hypofunction: Secondary | ICD-10-CM

## 2012-03-31 DIAGNOSIS — Z452 Encounter for adjustment and management of vascular access device: Secondary | ICD-10-CM | POA: Insufficient documentation

## 2012-03-31 DIAGNOSIS — C61 Malignant neoplasm of prostate: Secondary | ICD-10-CM

## 2012-03-31 DIAGNOSIS — Z5111 Encounter for antineoplastic chemotherapy: Secondary | ICD-10-CM

## 2012-03-31 DIAGNOSIS — C7952 Secondary malignant neoplasm of bone marrow: Secondary | ICD-10-CM

## 2012-03-31 DIAGNOSIS — C801 Malignant (primary) neoplasm, unspecified: Secondary | ICD-10-CM

## 2012-03-31 LAB — CBC WITH DIFFERENTIAL/PLATELET
BASO%: 0.7 % (ref 0.0–2.0)
Basophils Absolute: 0.1 10*3/uL (ref 0.0–0.1)
EOS%: 4.1 % (ref 0.0–7.0)
Eosinophils Absolute: 0.3 10*3/uL (ref 0.0–0.5)
HCT: 37.2 % — ABNORMAL LOW (ref 38.4–49.9)
HGB: 13.3 g/dL (ref 13.0–17.1)
LYMPH%: 27.9 % (ref 14.0–49.0)
MCH: 30 pg (ref 27.2–33.4)
MCHC: 35.8 g/dL (ref 32.0–36.0)
MCV: 83.8 fL (ref 79.3–98.0)
MONO#: 0.5 10*3/uL (ref 0.1–0.9)
MONO%: 7.8 % (ref 0.0–14.0)
NEUT#: 4.1 10*3/uL (ref 1.5–6.5)
NEUT%: 59.5 % (ref 39.0–75.0)
Platelets: 108 10*3/uL — ABNORMAL LOW (ref 140–400)
RBC: 4.44 10*6/uL (ref 4.20–5.82)
RDW: 13.5 % (ref 11.0–14.6)
WBC: 6.9 10*3/uL (ref 4.0–10.3)
lymph#: 1.9 10*3/uL (ref 0.9–3.3)
nRBC: 0 % (ref 0–0)

## 2012-03-31 MED ORDER — ACETAMINOPHEN 325 MG PO TABS
650.0000 mg | ORAL_TABLET | Freq: Once | ORAL | Status: AC
  Administered 2012-03-31: 650 mg via ORAL

## 2012-03-31 MED ORDER — SODIUM CHLORIDE 0.9 % IJ SOLN
10.0000 mL | INTRAMUSCULAR | Status: DC | PRN
  Administered 2012-03-31: 10 mL
  Filled 2012-03-31: qty 10

## 2012-03-31 MED ORDER — DIPHENHYDRAMINE HCL 50 MG/ML IJ SOLN
50.0000 mg | Freq: Once | INTRAMUSCULAR | Status: AC
  Administered 2012-03-31: 50 mg via INTRAVENOUS

## 2012-03-31 MED ORDER — SODIUM CHLORIDE 0.9 % IV SOLN: Freq: Once | INTRAVENOUS | Status: DC

## 2012-03-31 MED ORDER — LIDOCAINE HCL 1 % IJ SOLN
INTRAMUSCULAR | Status: AC
  Filled 2012-03-31: qty 20

## 2012-03-31 MED ORDER — HEPARIN SOD (PORK) LOCK FLUSH 100 UNIT/ML IV SOLN
500.0000 [IU] | Freq: Once | INTRAVENOUS | Status: AC | PRN
  Administered 2012-03-31: 500 [IU]
  Filled 2012-03-31: qty 5

## 2012-03-31 MED ORDER — SIPULEUCEL-T IV INFUSION
250.0000 mL | Freq: Once | INTRAVENOUS | Status: AC
  Administered 2012-03-31: 250 mL via INTRAVENOUS
  Filled 2012-03-31: qty 250

## 2012-03-31 NOTE — Procedures (Signed)
Right internal jugular tunneled pheresis catheter removed in its entirety without immediate complications. Medications utilized- 1% lidocaine to skin/SQ tissue. Guaze dressing applied to site.

## 2012-03-31 NOTE — Progress Notes (Signed)
Hematology and Oncology Follow Up Visit  Camryn Quesinberry [id number] [birth year] 56 y.o. 03/31/2012 9:10 AM No primary provider on file.No ref. 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 He had sclerotic lesions in T7 and L2 at the time of diagnosis. 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  Prior Therapy: Combined androgen deprivation with Lupron and Casodex. He had a good response and then developed a rise in the PSA with castrate level testosterone.   Current therapy: Provenge started on 03/03/12. Here for dose 3 out of 3 today.  He continues to be on Lupron and Zometa done at [facility].  Interim History:  Mr. Berent returns today for routine followup prior to his second dose of Provenge. He is seen prior to his next infusion of Provenge. No catheter related issues noted.No drainage or pain at insertion site. Reports some mild itching around the insertion site. He denies any pain today. No chest pain, shortness of breath, dyspnea, abdominal pain, nausea, vomiting. He denies any hematuria or difficulty voiding. He has no headaches or dizziness. Appetite and weight are stable. His performance status is at baseline. No fevers or sweats  Medications: I have reviewed the patient's current medications. Current outpatient prescriptions:cholecalciferol (VITAMIN D) 1000 UNITS tablet, Take 1,000 Units by mouth daily., Disp: , Rfl: ;  ibuprofen (ADVIL,MOTRIN) 200 MG tablet, Take 200 mg by mouth every 6 (six) hours as needed., Disp: , Rfl: ;  leuprolide (LUPRON) 30 MG injection, Inject 30 mg into the muscle every 4 (four) months., Disp: , Rfl: ;  Multiple Vitamin (MULTIVITAMIN) tablet, Take 1 tablet by mouth daily., Disp: , Rfl:    sertraline (ZOLOFT) 100 MG tablet, Take 100 mg by mouth daily., Disp: , Rfl: ;  Zoledronic Acid (ZOMETA) 4 MG/100ML IVPB, Inject 4 mg into the vein every 30 (thirty) days., Disp: , Rfl:   Allergies:  Allergies  Allergen Reactions  . Penicillins     Past Medical History, Surgical history, Social history, and Family History were reviewed and updated.  Review of Systems: Constitutional:  Negative for fever, chills, night sweats, anorexia, weight loss, pain. Cardiovascular: no chest pain or dyspnea on exertion Respiratory: no cough, shortness of breath, or wheezing Neurological: no TIA or stroke symptoms Dermatological: negative ENT: negative Skin: Negative. Gastrointestinal: no abdominal pain, change in bowel habits, or black or bloody stools Genito-Urinary: no dysuria, trouble voiding, or hematuria Hematological and Lymphatic: negative Breast: negative for breast lumps Musculoskeletal: negative Remaining ROS negative.  Physical Exam: Blood pressure 123/77, pulse 67, temperature 97 F (36.1 C), temperature source Oral, height 6\' 2"  (1.88 m), weight 320 lb 12.8 oz (145.514 kg). ECOG: 0 General appearance: alert, cooperative and no distress Head: Normocephalic, without obvious abnormality, atraumatic Neck: no adenopathy, no carotid bruit, no JVD, supple, symmetrical, trachea midline and thyroid not enlarged, symmetric, no tenderness/mass/nodules Lymph nodes: Cervical, supraclavicular, and axillary nodes normal. Heart:regular rate and rhythm, S1, S2 normal, no murmur, click, rub or gallop Lung:chest clear, no wheezing, rales, normal symmetric air entry, no tachypnea, retractions or cyanosis Phoresis cathter in place. No redness or drainage at insertion site. Abdomen: soft, non-tender, without masses or organomegaly EXT:no erythema, induration, or nodules  Lab Results: Lab Results  Component Value Date  WBC 6.9 03/31/2012   HGB 13.3 03/31/2012   HCT 37.2* 03/31/2012   MCV 83.8  03/31/2012   PLT 108* 03/31/2012     Chemistry      Component Value Date/Time   NA 139 01/13/2012 1340   K 3.7 01/13/2012 1340   CL 102 01/13/2012 1340   CO2 25 01/13/2012 1340   BUN 20 01/13/2012 1340   CREATININE 0.91 01/13/2012 1340      Component Value Date/Time   CALCIUM 9.6 01/13/2012 1340   ALKPHOS 75 01/13/2012 1340   AST 25 01/13/2012 1340   ALT 31 01/13/2012 1340   BILITOT 0.5 01/13/2012 1340     Impression and Plan: This is a 56 year old gentleman with the following issues: 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 On Provenge and here for dose 3/3. Tolerated two previous  doses well.  2. Androgen deprivation. He'll continue his Lupron every 4 months at [facility]. 3. Bony disease. He'll continue Zometa monthly at [facility]. He will continue his calcium and vitamin D supplements. 4. IV access: Has aphoresis catheter in place. Sutures have come out. No signs of infection. Catheter to be removed by IR  today. 5. Followup. In 2 months and we will assess his PSA then.   Braysen Cloward 5/17/20139:10 AM

## 2012-03-31 NOTE — Telephone Encounter (Signed)
Gave pt calendar for July 2013 lab and MD

## 2012-03-31 NOTE — Patient Instructions (Signed)
Patient aware of next appointment; tolerated treatment well; 30 minute observation done; no signs of distress noted; discharged home with no complaints.

## 2012-05-29 ENCOUNTER — Telehealth: Payer: Self-pay | Admitting: Oncology

## 2012-05-29 NOTE — Telephone Encounter (Signed)
Pt called and r/s appt from 7/17 to 06/09/12, lab and MD

## 2012-05-31 ENCOUNTER — Ambulatory Visit: Payer: Managed Care, Other (non HMO) | Admitting: Oncology

## 2012-05-31 ENCOUNTER — Other Ambulatory Visit: Payer: Managed Care, Other (non HMO) | Admitting: Lab

## 2012-06-02 ENCOUNTER — Encounter: Payer: Self-pay | Admitting: Oncology

## 2012-06-02 NOTE — Progress Notes (Signed)
Faxed insurance form to [employer] [id number] on 05/23/12 (did not go through) and 05/30/12.

## 2012-06-09 ENCOUNTER — Ambulatory Visit (HOSPITAL_BASED_OUTPATIENT_CLINIC_OR_DEPARTMENT_OTHER): Payer: Managed Care, Other (non HMO) | Admitting: Oncology

## 2012-06-09 ENCOUNTER — Other Ambulatory Visit (HOSPITAL_BASED_OUTPATIENT_CLINIC_OR_DEPARTMENT_OTHER): Payer: Managed Care, Other (non HMO) | Admitting: Lab

## 2012-06-09 ENCOUNTER — Telehealth: Payer: Self-pay | Admitting: *Deleted

## 2012-06-09 VITALS — BP 129/88 | HR 62 | Temp 97.0°F | Ht 74.0 in | Wt 315.7 lb

## 2012-06-09 DIAGNOSIS — C7952 Secondary malignant neoplasm of bone marrow: Secondary | ICD-10-CM

## 2012-06-09 DIAGNOSIS — C61 Malignant neoplasm of prostate: Secondary | ICD-10-CM

## 2012-06-09 DIAGNOSIS — C7951 Secondary malignant neoplasm of bone: Secondary | ICD-10-CM

## 2012-06-09 LAB — CBC WITH DIFFERENTIAL/PLATELET
BASO%: 0.3 % (ref 0.0–2.0)
Basophils Absolute: 0 10*3/uL (ref 0.0–0.1)
EOS%: 2.9 % (ref 0.0–7.0)
Eosinophils Absolute: 0.2 10*3/uL (ref 0.0–0.5)
HCT: 37 % — ABNORMAL LOW (ref 38.4–49.9)
HGB: 12.8 g/dL — ABNORMAL LOW (ref 13.0–17.1)
LYMPH%: 21.6 % (ref 14.0–49.0)
MCH: 29.6 pg (ref 27.2–33.4)
MCHC: 34.6 g/dL (ref 32.0–36.0)
MCV: 85.5 fL (ref 79.3–98.0)
MONO#: 0.4 10*3/uL (ref 0.1–0.9)
MONO%: 6.2 % (ref 0.0–14.0)
NEUT#: 4.4 10*3/uL (ref 1.5–6.5)
NEUT%: 69 % (ref 39.0–75.0)
Platelets: 118 10*3/uL — ABNORMAL LOW (ref 140–400)
RBC: 4.33 10*6/uL (ref 4.20–5.82)
RDW: 13.7 % (ref 11.0–14.6)
WBC: 6.3 10*3/uL (ref 4.0–10.3)
lymph#: 1.4 10*3/uL (ref 0.9–3.3)

## 2012-06-09 LAB — COMPREHENSIVE METABOLIC PANEL
ALT: 29 U/L (ref 0–53)
AST: 20 U/L (ref 0–37)
Albumin: 4.1 g/dL (ref 3.5–5.2)
Alkaline Phosphatase: 70 U/L (ref 39–117)
BUN: 20 mg/dL (ref 6–23)
CO2: 30 mEq/L (ref 19–32)
Calcium: 9.1 mg/dL (ref 8.4–10.5)
Chloride: 102 mEq/L (ref 96–112)
Creatinine, Ser: 0.81 mg/dL (ref 0.50–1.35)
Glucose, Bld: 94 mg/dL (ref 70–99)
Potassium: 4.4 mEq/L (ref 3.5–5.3)
Sodium: 139 mEq/L (ref 135–145)
Total Bilirubin: 0.3 mg/dL (ref 0.3–1.2)
Total Protein: 6.8 g/dL (ref 6.0–8.3)

## 2012-06-09 LAB — PSA: PSA: 1.72 ng/mL (ref <=4.00)

## 2012-06-09 NOTE — Telephone Encounter (Signed)
Gave patient appointment for 08-10-2012 starting at 9:00am printed out calendar and gave to the patient

## 2012-06-09 NOTE — Telephone Encounter (Signed)
Gave patient appointment for 08-10-2012 starting at 9:00am

## 2012-06-09 NOTE — Progress Notes (Signed)
Hematology and Oncology Follow Up Visit  Gregory Schneider [id number] Oct 08, 1956 56 y.o. 06/09/2012 10:28 AM No primary provider on file.No ref. 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 He had sclerotic lesions in T7 and L2 at the time of diagnosis. 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  Prior Therapy: Combined androgen deprivation with Lupron and Casodex. He had a good response and then developed a rise in the PSA with castrate level testosterone.   Current therapy: Provenge started on 03/03/12. Therapy Ended in 03/31/2012.  He continues to be on Lupron and Zometa done at [facility].  Interim History:  Gregory Schneider returns today for routine follow up. He completed  Provenge without complications. He feels a lot better after completing the course. He denies any pain today. No chest pain, shortness of breath, dyspnea, abdominal pain, nausea, vomiting. He denies any hematuria or difficulty voiding. He has no headaches or dizziness. Appetite and weight are stable. His performance status is at baseline. No fevers or sweats  Medications: I have reviewed the patient's current medications. Current outpatient prescriptions:cholecalciferol (VITAMIN D) 1000 UNITS tablet, Take 1,000 Units by mouth daily., Disp: , Rfl: ;  ibuprofen (ADVIL,MOTRIN) 200 MG tablet, Take 200 mg by mouth every 6 (six) hours as needed., Disp: , Rfl: ;  leuprolide (LUPRON) 30 MG injection, Inject 30 mg into the muscle every 4 (four) months., Disp: , Rfl: ;  Multiple Vitamin (MULTIVITAMIN) tablet, Take 1 tablet by mouth daily., Disp: , Rfl:  sertraline (ZOLOFT) 100 MG tablet, Take 100 mg by mouth daily., Disp: , Rfl: ;  Zoledronic Acid (ZOMETA) 4 MG/100ML IVPB,  Inject 4 mg into the vein every 30 (thirty) days., Disp: , Rfl:   Allergies:  Allergies  Allergen Reactions  . Penicillins     Past Medical History, Surgical history, Social history, and Family History were reviewed and updated.  Review of Systems: Constitutional:  Negative for fever, chills, night sweats, anorexia, weight loss, pain. Cardiovascular: no chest pain or dyspnea on exertion Respiratory: no cough, shortness of breath, or wheezing Neurological: no TIA or stroke symptoms Dermatological: negative ENT: negative Skin: Negative. Gastrointestinal: no abdominal pain, change in bowel habits, or black or bloody stools Genito-Urinary: no dysuria, trouble voiding, or hematuria Hematological and Lymphatic: negative Breast: negative for breast lumps Musculoskeletal: negative Remaining ROS negative.  Physical Exam: Blood pressure 129/88, pulse 62, temperature 97 F (36.1 C), temperature source Oral, height 6\' 2"  (1.88 m), weight 315 lb 11.2 oz (143.201 kg). ECOG: 0 General appearance: alert, cooperative and no distress Head: Normocephalic, without obvious abnormality, atraumatic Neck: no adenopathy, no carotid bruit, no JVD, supple, symmetrical, trachea midline and thyroid not enlarged, symmetric, no tenderness/mass/nodules Lymph nodes: Cervical, supraclavicular, and axillary nodes normal. Heart:regular rate and rhythm, S1, S2 normal, no murmur, click, rub or gallop Lung:chest clear, no wheezing, rales, normal symmetric air entry, no tachypnea, retractions or cyanosis Phoresis cathter in place. No redness or drainage at insertion site. Abdomen: soft, non-tender, without masses or organomegaly EXT:no erythema, induration, or nodules  Lab Results: Lab Results  Component Value Date   WBC 6.3 06/09/2012   HGB 12.8* 06/09/2012   HCT 37.0* 06/09/2012   MCV 85.5 06/09/2012   PLT 118* 06/09/2012  Chemistry      Component Value Date/Time   NA 139 01/13/2012 1340   K 3.7 01/13/2012  1340   CL 102 01/13/2012 1340   CO2 25 01/13/2012 1340   BUN 20 01/13/2012 1340   CREATININE 0.91 01/13/2012 1340      Component Value Date/Time   CALCIUM 9.6 01/13/2012 1340   ALKPHOS 75 01/13/2012 1340   AST 25 01/13/2012 1340   ALT 31 01/13/2012 1340   BILITOT 0.5 01/13/2012 1340     Impression and Plan: This is a 56 year old gentleman with the following issues: 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 He is S/PProvenge and did very well with it and after words. The plan is to continue observation for now for at least 3-6 months from the completion of therapy and possibly add a different agent at progression.   2. Androgen deprivation. He'll continue his Lupron every 4 months at [facility]. 3. Bony disease. He'll continue Zometa monthly at [facility]. He will continue his calcium and vitamin D supplements. 4. Followup. In 2 months and we will assess his PSA then.   Gregory Schneider 7/26/201310:28 AM

## 2012-07-07 ENCOUNTER — Telehealth: Payer: Self-pay | Admitting: Oncology

## 2012-07-07 ENCOUNTER — Other Ambulatory Visit: Payer: Self-pay | Admitting: Oncology

## 2012-07-07 ENCOUNTER — Telehealth: Payer: Self-pay | Admitting: *Deleted

## 2012-07-07 MED ORDER — OXYCODONE HCL 5 MG PO TABS: 5.0000 mg | ORAL_TABLET | ORAL | Status: AC | PRN

## 2012-07-07 NOTE — Telephone Encounter (Signed)
called pt and he is aware of his 8/26 appt

## 2012-07-07 NOTE — Telephone Encounter (Signed)
Patient is experiencing right shoulder/back pain. Wife describes it as intense throbbing near the scapula where a previous lesion has been noted. Patient has decreased mobility of the arm. This pain has persisted for about a week, with the last couple days being more intense. PAtient unable to get comfortable and did not sleep much last night. . The patient experiences mild SOB, not at rest; SOB noted mostly when patient takes a deep breath.  No swelling, erythema, streaking noted in right upper extremity. Patient only takes Ibuprofen; patient willing to take stronger pain medication.  Per [name], DNP, patient to come to clinic next week for evaluation.  In the meantime, patient prescribed pain medication.  It was instructed to patient's wife that if SOB occurs at rest, or worsens, patient needs to be evaluated in the ED.  Teach-back learning was instituted.  Wife verbalized understanding.  Patient called and scheduled for Monday 07/10/2012 at 1045 with [name], DNP.  Called and made aware.

## 2012-07-10 ENCOUNTER — Telehealth: Payer: Self-pay | Admitting: *Deleted

## 2012-07-10 ENCOUNTER — Ambulatory Visit: Payer: Managed Care, Other (non HMO) | Admitting: Oncology

## 2012-07-10 NOTE — Telephone Encounter (Signed)
Received call from wife Orlyn Odonoghue re:  Wife had cancelled pt's appt  Due to pt has had no pain today.   Shawna Orleans stated pt took pain meds over weekend, and pain is almost relieved to no pain at present.   Shawna Orleans stated she did not see any needs for pt to come in since pain is gone. Melanie's   Phone    [phone].

## 2012-08-10 ENCOUNTER — Telehealth: Payer: Self-pay | Admitting: Oncology

## 2012-08-10 ENCOUNTER — Other Ambulatory Visit (HOSPITAL_BASED_OUTPATIENT_CLINIC_OR_DEPARTMENT_OTHER): Payer: BC Managed Care – PPO | Admitting: Lab

## 2012-08-10 ENCOUNTER — Ambulatory Visit (HOSPITAL_BASED_OUTPATIENT_CLINIC_OR_DEPARTMENT_OTHER): Payer: BC Managed Care – PPO | Admitting: Oncology

## 2012-08-10 VITALS — BP 134/73 | HR 67 | Temp 98.6°F | Resp 20 | Ht 74.0 in | Wt 316.8 lb

## 2012-08-10 DIAGNOSIS — C7951 Secondary malignant neoplasm of bone: Secondary | ICD-10-CM

## 2012-08-10 DIAGNOSIS — C61 Malignant neoplasm of prostate: Secondary | ICD-10-CM

## 2012-08-10 DIAGNOSIS — C7952 Secondary malignant neoplasm of bone marrow: Secondary | ICD-10-CM

## 2012-08-10 LAB — COMPREHENSIVE METABOLIC PANEL (CC13)
ALT: 48 U/L (ref 0–55)
AST: 28 U/L (ref 5–34)
Albumin: 4 g/dL (ref 3.5–5.0)
Alkaline Phosphatase: 76 U/L (ref 40–150)
BUN: 18 mg/dL (ref 7.0–26.0)
CO2: 25 mEq/L (ref 22–29)
Calcium: 9.9 mg/dL (ref 8.4–10.4)
Chloride: 104 mEq/L (ref 98–107)
Creatinine: 0.9 mg/dL (ref 0.7–1.3)
Glucose: 107 mg/dl — ABNORMAL HIGH (ref 70–99)
Potassium: 4 mEq/L (ref 3.5–5.1)
Sodium: 140 mEq/L (ref 136–145)
Total Bilirubin: 0.4 mg/dL (ref 0.20–1.20)
Total Protein: 7.3 g/dL (ref 6.4–8.3)

## 2012-08-10 LAB — PSA: PSA: 3.38 ng/mL (ref <=4.00)

## 2012-08-10 LAB — CBC WITH DIFFERENTIAL/PLATELET
BASO%: 0.7 % (ref 0.0–2.0)
Basophils Absolute: 0 10*3/uL (ref 0.0–0.1)
EOS%: 3 % (ref 0.0–7.0)
Eosinophils Absolute: 0.2 10*3/uL (ref 0.0–0.5)
HCT: 39.1 % (ref 38.4–49.9)
HGB: 13.6 g/dL (ref 13.0–17.1)
LYMPH%: 22 % (ref 14.0–49.0)
MCH: 29.6 pg (ref 27.2–33.4)
MCHC: 34.8 g/dL (ref 32.0–36.0)
MCV: 85 fL (ref 79.3–98.0)
MONO#: 0.3 10*3/uL (ref 0.1–0.9)
MONO%: 5.6 % (ref 0.0–14.0)
NEUT#: 4.1 10*3/uL (ref 1.5–6.5)
NEUT%: 68.7 % (ref 39.0–75.0)
Platelets: 130 10*3/uL — ABNORMAL LOW (ref 140–400)
RBC: 4.6 10*6/uL (ref 4.20–5.82)
RDW: 14.5 % (ref 11.0–14.6)
WBC: 5.9 10*3/uL (ref 4.0–10.3)
lymph#: 1.3 10*3/uL (ref 0.9–3.3)

## 2012-08-10 NOTE — Telephone Encounter (Signed)
Printed and gv pt DEC appt...sed

## 2012-08-10 NOTE — Progress Notes (Signed)
Hematology and Oncology Follow Up Visit  Gregory Schneider [id number] [birth year] 56 y.o. 08/10/2012 9:55 AM No primary provider on file.No ref. 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 He had sclerotic lesions in T7 and L2 at the time of diagnosis. 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  Prior Therapy: Combined androgen deprivation with Lupron and Casodex. He had a good response and then developed a rise in the PSA with castrate level testosterone.   Current therapy: Provenge started on 03/03/12. Therapy Ended in 03/31/2012.  He continues to be on Lupron and Zometa done at [facility].  Interim History:  Gregory Schneider returns today for routine follow up. He completed  Provenge without complications. He feels a lot better after completing the course. He denies any pain today. No chest pain, shortness of breath, dyspnea, abdominal pain, nausea, vomiting. He denies any hematuria or difficulty voiding. He has no headaches or dizziness. Appetite and weight are stable. His performance status is at baseline. No fevers or sweats. He reports no new complaints at this time.   Medications: I have reviewed the patient's current medications. Current outpatient prescriptions:cholecalciferol (VITAMIN D) 1000 UNITS tablet, Take 1,000 Units by mouth daily., Disp: , Rfl: ;  ibuprofen (ADVIL,MOTRIN) 200 MG tablet, Take 200 mg by mouth every 6 (six) hours as needed., Disp: , Rfl: ;  leuprolide (LUPRON) 30 MG injection, Inject 30 mg into the muscle every 4 (four) months., Disp: , Rfl: ;  Multiple Vitamin (MULTIVITAMIN) tablet, Take 1 tablet by mouth daily., Disp: , Rfl:  sertraline (ZOLOFT) 100 MG tablet, Take 100 mg by mouth daily., Disp: , Rfl: ;   Zoledronic Acid (ZOMETA) 4 MG/100ML IVPB, Inject 4 mg into the vein every 30 (thirty) days., Disp: , Rfl:   Allergies:  Allergies  Allergen Reactions  . Penicillins     Past Medical History, Surgical history, Social history, and Family History were reviewed and updated.  Review of Systems: Constitutional:  Negative for fever, chills, night sweats, anorexia, weight loss, pain. Cardiovascular: no chest pain or dyspnea on exertion Respiratory: no cough, shortness of breath, or wheezing Neurological: no TIA or stroke symptoms Dermatological: negative ENT: negative Skin: Negative. Gastrointestinal: no abdominal pain, change in bowel habits, or black or bloody stools Genito-Urinary: no dysuria, trouble voiding, or hematuria Hematological and Lymphatic: negative Breast: negative for breast lumps Musculoskeletal: negative Remaining ROS negative.  Physical Exam: Blood pressure 134/73, pulse 67, temperature 98.6 F (37 C), temperature source Oral, resp. rate 20, height 6\' 2"  (1.88 m), weight 316 lb 12.8 oz (143.7 kg). ECOG: 0 General appearance: alert, cooperative and no distress Head: Normocephalic, without obvious abnormality, atraumatic Neck: no adenopathy, no carotid bruit, no JVD, supple, symmetrical, trachea midline and thyroid not enlarged, symmetric, no tenderness/mass/nodules Lymph nodes: Cervical, supraclavicular, and axillary nodes normal. Heart:regular rate and rhythm, S1, S2 normal, no murmur, click, rub or gallop Lung:chest clear, no wheezing, rales, normal symmetric air entry, no tachypnea, retractions or cyanosis Phoresis cathter in place. No redness or drainage at insertion site. Abdomen: soft, non-tender, without masses or organomegaly EXT:no erythema, induration, or nodules  Lab Results: Lab Results  Component Value Date   WBC 5.9 08/10/2012   HGB 13.6 08/10/2012   HCT  39.1 08/10/2012   MCV 85.0 08/10/2012   PLT 130* 08/10/2012     Chemistry      Component Value  Date/Time   NA 139 06/09/2012 0956   K 4.4 06/09/2012 0956   CL 102 06/09/2012 0956   CO2 30 06/09/2012 0956   BUN 20 06/09/2012 0956   CREATININE 0.81 06/09/2012 0956      Component Value Date/Time   CALCIUM 9.1 06/09/2012 0956   ALKPHOS 70 06/09/2012 0956   AST 20 06/09/2012 0956   ALT 29 06/09/2012 0956   BILITOT 0.3 06/09/2012 0956     Results for Gregory, Schneider (MRN [id number]) as of 08/10/2012 09:56  Ref. Range 06/09/2012 09:56  PSA Latest Range: <=4.00 ng/mL 1.72   Impression and Plan: This is a 56 year old gentleman with the following issues: 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 He is S/P Provenge and did very well with it and after words. The plan is to continue observation for now for at least 3-6 months from the completion of therapy and possibly add a different agent at progression. At this point, there is no reason to add another agent, but this can change with a rise in PSA.  2. Androgen deprivation. He'll continue his Lupron every 4 months at [facility]. 3. Bony disease. He'll continue Zometa monthly at [facility]. He will continue his calcium and vitamin D supplements. 4. Followup. In 2 months and we will assess his PSA then.   Gregory Schneider 9/26/20139:55 AM

## 2012-10-17 ENCOUNTER — Telehealth: Payer: Self-pay | Admitting: Oncology

## 2012-10-17 ENCOUNTER — Ambulatory Visit (HOSPITAL_BASED_OUTPATIENT_CLINIC_OR_DEPARTMENT_OTHER): Payer: BC Managed Care – PPO | Admitting: Oncology

## 2012-10-17 ENCOUNTER — Other Ambulatory Visit (HOSPITAL_BASED_OUTPATIENT_CLINIC_OR_DEPARTMENT_OTHER): Payer: BC Managed Care – PPO

## 2012-10-17 VITALS — BP 125/75 | HR 66 | Temp 96.9°F | Resp 20

## 2012-10-17 DIAGNOSIS — C61 Malignant neoplasm of prostate: Secondary | ICD-10-CM

## 2012-10-17 LAB — COMPREHENSIVE METABOLIC PANEL (CC13)
ALT: 53 U/L (ref 0–55)
AST: 28 U/L (ref 5–34)
Albumin: 4.1 g/dL (ref 3.5–5.0)
Alkaline Phosphatase: 75 U/L (ref 40–150)
BUN: 20 mg/dL (ref 7.0–26.0)
CO2: 30 mEq/L — ABNORMAL HIGH (ref 22–29)
Calcium: 9.2 mg/dL (ref 8.4–10.4)
Chloride: 101 mEq/L (ref 98–107)
Creatinine: 0.9 mg/dL (ref 0.7–1.3)
Glucose: 133 mg/dl — ABNORMAL HIGH (ref 70–99)
Potassium: 4.3 mEq/L (ref 3.5–5.1)
Sodium: 141 mEq/L (ref 136–145)
Total Bilirubin: 0.56 mg/dL (ref 0.20–1.20)
Total Protein: 7.3 g/dL (ref 6.4–8.3)

## 2012-10-17 LAB — CBC WITH DIFFERENTIAL/PLATELET
BASO%: 0.4 % (ref 0.0–2.0)
Basophils Absolute: 0 10*3/uL (ref 0.0–0.1)
EOS%: 2.8 % (ref 0.0–7.0)
Eosinophils Absolute: 0.2 10*3/uL (ref 0.0–0.5)
HCT: 40 % (ref 38.4–49.9)
HGB: 13.8 g/dL (ref 13.0–17.1)
LYMPH%: 20.6 % (ref 14.0–49.0)
MCH: 29.7 pg (ref 27.2–33.4)
MCHC: 34.6 g/dL (ref 32.0–36.0)
MCV: 85.9 fL (ref 79.3–98.0)
MONO#: 0.3 10*3/uL (ref 0.1–0.9)
MONO%: 5.5 % (ref 0.0–14.0)
NEUT#: 4.2 10*3/uL (ref 1.5–6.5)
NEUT%: 70.7 % (ref 39.0–75.0)
Platelets: 130 10*3/uL — ABNORMAL LOW (ref 140–400)
RBC: 4.66 10*6/uL (ref 4.20–5.82)
RDW: 13.9 % (ref 11.0–14.6)
WBC: 6 10*3/uL (ref 4.0–10.3)
lymph#: 1.2 10*3/uL (ref 0.9–3.3)

## 2012-10-17 LAB — PSA: PSA: 5.41 ng/mL — ABNORMAL HIGH (ref <=4.00)

## 2012-10-17 NOTE — Progress Notes (Signed)
Hematology and Oncology Follow Up Visit  Gregory Schneider [id number] 03/16/1956 56 y.o. 10/17/2012 9:49 AM No primary provider on file.No ref. provider found   Principle Diagnosis: 56 year old with prostate cancer dating back to 2010, where he was found to have an elevated PSA up to 15 and subsequently underwent a biopsy which showed a Gleason score 4 + 5 equals 9 in the right and the left apex of his prostate, indicating a poorly-differentiated adenocarcinoma of the prostate. He had sclerotic lesions in T7 and L2 at the time of diagnosis. 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  Prior Therapy: Combined androgen deprivation with Lupron and Casodex. He had a good response and then developed a rise in the PSA with castrate level testosterone.   Current therapy: Provenge started on 03/03/12. Therapy Ended in 03/31/2012.  He continues to be on Lupron and Zometa done at [facility].  Interim History:  Gregory Schneider returns today for routine follow up. He completed Provenge without complications in 03/2012. He feels a lot better after completing the course. He denies any pain today. No chest pain, shortness of breath, dyspnea, abdominal pain, nausea, vomiting. He denies any hematuria or difficulty voiding. He has no headaches or dizziness. Appetite and weight are stable. His performance status is at baseline. No fevers or sweats. He reports no new complaints at this time. He reports left testicular enlargement. No pain or fevers noted.   Medications: I have reviewed the patient's current medications. Current outpatient prescriptions:cholecalciferol (VITAMIN D) 1000 UNITS tablet, Take 1,000 Units by mouth daily., Disp: , Rfl: ;  ibuprofen (ADVIL,MOTRIN) 200 MG tablet, Take 200 mg by mouth every 6 (six) hours as needed., Disp: , Rfl: ;  leuprolide (LUPRON) 30 MG injection, Inject 30 mg into the muscle every 4 (four) months., Disp: , Rfl: ;  Multiple Vitamin (MULTIVITAMIN) tablet, Take 1 tablet by mouth daily.,  Disp: , Rfl:  sertraline (ZOLOFT) 100 MG tablet, Take 100 mg by mouth daily., Disp: , Rfl: ;  Zoledronic Acid (ZOMETA) 4 MG/100ML IVPB, Inject 4 mg into the vein every 30 (thirty) days., Disp: , Rfl:   Allergies:  Allergies  Allergen Reactions  . Penicillins     Past Medical History, Surgical history, Social history, and Family History were reviewed and updated.  Review of Systems: Constitutional:  Negative for fever, chills, night sweats, anorexia, weight loss, pain. Cardiovascular: no chest pain or dyspnea on exertion Respiratory: no cough, shortness of breath, or wheezing Neurological: no TIA or stroke symptoms Dermatological: negative ENT: negative Skin: Negative. Gastrointestinal: no abdominal pain, change in bowel habits, or black or bloody stools Genito-Urinary: no dysuria, trouble voiding, or hematuria Hematological and Lymphatic: negative Breast: negative for breast lumps Musculoskeletal: negative Remaining ROS negative.  Physical Exam: Blood pressure 125/75, pulse 66, temperature 96.9 F (36.1 C), temperature source Oral, resp. rate 20. ECOG: 0 General appearance: alert, cooperative and no distress Head: Normocephalic, without obvious abnormality, atraumatic Neck: no adenopathy, no carotid bruit, no JVD, supple, symmetrical, trachea midline and thyroid not enlarged, symmetric, no tenderness/mass/nodules Lymph nodes: Cervical, supraclavicular, and axillary nodes normal. Heart:regular rate and rhythm, S1, S2 normal, no murmur, click, rub or gallop Lung:chest clear, no wheezing, rales, normal symmetric air entry, no tachypnea, retractions or cyanosis Phoresis cathter in place. No redness or drainage at insertion site. Abdomen: soft, non-tender, without masses or organomegaly EXT:no erythema, induration, or nodules Left scrotum is enlarged. No masses palpated at this time.   Lab Results: Lab Results  Component Value  Date   WBC 6.0 10/17/2012   HGB 13.8 10/17/2012    HCT 40.0 10/17/2012   MCV 85.9 10/17/2012   PLT 130* 10/17/2012     Chemistry      Component Value Date/Time   NA 140 08/10/2012 0923   NA 139 06/09/2012 0956   K 4.0 08/10/2012 0923   K 4.4 06/09/2012 0956   CL 104 08/10/2012 0923   CL 102 06/09/2012 0956   CO2 25 08/10/2012 0923   CO2 30 06/09/2012 0956   BUN 18.0 08/10/2012 0923   BUN 20 06/09/2012 0956   CREATININE 0.9 08/10/2012 0923   CREATININE 0.81 06/09/2012 0956      Component Value Date/Time   CALCIUM 9.9 08/10/2012 0923   CALCIUM 9.1 06/09/2012 0956   ALKPHOS 76 08/10/2012 0923   ALKPHOS 70 06/09/2012 0956   AST 28 08/10/2012 0923   AST 20 06/09/2012 0956   ALT 48 08/10/2012 0923   ALT 29 06/09/2012 0956   BILITOT 0.40 08/10/2012 0923   BILITOT 0.3 06/09/2012 0956     Results for Gregory Schneider, Gregory Schneider (MRN [id number]) as of 10/17/2012 08:54  Ref. 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 He is S/P Provenge and did very well with it and after words. The plan is restage him with CT scan and bone scan and possibly add a different agent if progression is noted. Gregory Schneider was discussed today as a possibility. 2. Androgen deprivation. He'll continue his Lupron every 4 months at [facility]. He is asking about possible orchiectomy  instead. I certainly have no problems with that if he prefers surgery.  3. Bony disease. He'll continue Zometa monthly at [facility]. He will continue his calcium and vitamin D supplements. 4. Followup. In ome month after imaging studies. 5. Left scrotal enlargement: He is following with Dr. Laverle Patter on 12/4. He will likely need an U/S for possible mass vs hydrocele vs. Other etiologies.    Gregory Schneider 12/3/20139:49 AM

## 2012-10-17 NOTE — Telephone Encounter (Signed)
gv and pritned appt schedule for pt for Jan 2014....gv pt Barium..the patient aware that central scheduling will call with d.t for CT and PET

## 2012-11-21 ENCOUNTER — Encounter (HOSPITAL_COMMUNITY): Payer: BC Managed Care – PPO

## 2012-11-21 ENCOUNTER — Ambulatory Visit (HOSPITAL_COMMUNITY): Admission: RE | Admit: 2012-11-21 | Payer: BC Managed Care – PPO | Source: Ambulatory Visit

## 2012-11-23 ENCOUNTER — Ambulatory Visit (HOSPITAL_COMMUNITY)
Admission: RE | Admit: 2012-11-23 | Discharge: 2012-11-23 | Disposition: A | Discharge status: Home / Self Care | Payer: BC Managed Care – PPO | Source: Ambulatory Visit | Attending: Oncology | Admitting: Oncology

## 2012-11-23 ENCOUNTER — Ambulatory Visit (HOSPITAL_BASED_OUTPATIENT_CLINIC_OR_DEPARTMENT_OTHER): Payer: BC Managed Care – PPO | Admitting: Oncology

## 2012-11-23 ENCOUNTER — Telehealth: Payer: Self-pay | Admitting: Oncology

## 2012-11-23 VITALS — BP 154/85 | HR 73 | Temp 97.8°F | Resp 18 | Ht 74.0 in | Wt 321.0 lb

## 2012-11-23 DIAGNOSIS — I2510 Atherosclerotic heart disease of native coronary artery without angina pectoris: Secondary | ICD-10-CM | POA: Insufficient documentation

## 2012-11-23 DIAGNOSIS — C7952 Secondary malignant neoplasm of bone marrow: Secondary | ICD-10-CM

## 2012-11-23 DIAGNOSIS — K5730 Diverticulosis of large intestine without perforation or abscess without bleeding: Secondary | ICD-10-CM | POA: Insufficient documentation

## 2012-11-23 DIAGNOSIS — I700 Atherosclerosis of aorta: Secondary | ICD-10-CM | POA: Insufficient documentation

## 2012-11-23 DIAGNOSIS — C61 Malignant neoplasm of prostate: Secondary | ICD-10-CM

## 2012-11-23 DIAGNOSIS — N508 Other specified disorders of male genital organs: Secondary | ICD-10-CM

## 2012-11-23 DIAGNOSIS — C7951 Secondary malignant neoplasm of bone: Secondary | ICD-10-CM | POA: Insufficient documentation

## 2012-11-23 DIAGNOSIS — R911 Solitary pulmonary nodule: Secondary | ICD-10-CM | POA: Insufficient documentation

## 2012-11-23 MED ORDER — IOHEXOL 300 MG/ML  SOLN
125.0000 mL | Freq: Once | INTRAMUSCULAR | Status: AC | PRN
  Administered 2012-11-23: 125 mL via INTRAVENOUS

## 2012-11-23 NOTE — Telephone Encounter (Signed)
appts made and printed for pt aom °

## 2012-11-23 NOTE — Progress Notes (Signed)
Hematology and Oncology Follow Up Visit  Gregory Schneider [id number] [birth year] 57 y.o. 11/23/2012 3:26 PM No primary provider on file.No ref. provider found   Principle Diagnosis: 57 year old with prostate cancer dating back to 2010, where he was found to have an elevated PSA up to 15 and subsequently underwent a biopsy which showed a Gleason score 4 + 5 equals 9 in the right and the left apex of his prostate, indicating a poorly-differentiated adenocarcinoma of the prostate. He had sclerotic lesions in T7 and L2 at the time of diagnosis. 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  Prior Therapy: Combined androgen deprivation with Lupron and Casodex. He had a good response and then developed a rise in the PSA with castrate level testosterone.   Current therapy: Provenge started on 03/03/12. Therapy Ended in 03/31/2012.  He continues to be on Lupron and Zometa done at [facility].  Interim History:  Gregory Schneider returns today for routine follow up. He completed Provenge without complications in 03/2012. He feels a lot better after completing the course. He denies any pain today. No chest pain, shortness of breath, dyspnea, abdominal pain, nausea, vomiting. He denies any hematuria or difficulty voiding. He has no headaches or dizziness. Appetite and weight are stable. His performance status is at baseline. No fevers or sweats. He reports no new complaints at this time. He reports left testicular enlargement which has not got any better. No pain or fevers noted.   Medications: I have reviewed the patient's current medications. Current outpatient prescriptions:cholecalciferol (VITAMIN D) 1000 UNITS tablet, Take 1,000 Units by mouth daily., Disp: , Rfl: ;  ibuprofen (ADVIL,MOTRIN) 200 MG tablet, Take 200 mg by mouth every 6 (six) hours as needed., Disp: , Rfl: ;  leuprolide (LUPRON) 30 MG injection, Inject 30 mg into the muscle every 4 (four) months., Disp: , Rfl: ;  Multiple Vitamin (MULTIVITAMIN) tablet, Take  1 tablet by mouth daily., Disp: , Rfl:  sertraline (ZOLOFT) 100 MG tablet, Take 100 mg by mouth daily., Disp: , Rfl: ;  Zoledronic Acid (ZOMETA) 4 MG/100ML IVPB, Inject 4 mg into the vein every 30 (thirty) days., Disp: , Rfl:   Allergies:  Allergies  Allergen Reactions  . Penicillins     Past Medical History, Surgical history, Social history, and Family History were reviewed and updated.  Review of Systems: Constitutional:  Negative for fever, chills, night sweats, anorexia, weight loss, pain. Cardiovascular: no chest pain or dyspnea on exertion Respiratory: no cough, shortness of breath, or wheezing Neurological: no TIA or stroke symptoms Dermatological: negative ENT: negative Skin: Negative. Gastrointestinal: no abdominal pain, change in bowel habits, or black or bloody stools Genito-Urinary: no dysuria, trouble voiding, or hematuria Hematological and Lymphatic: negative Breast: negative for breast lumps Musculoskeletal: negative Remaining ROS negative.  Physical Exam: Blood pressure 154/85, pulse 73, temperature 97.8 F (36.6 C), temperature source Oral, resp. rate 18, height 6\' 2"  (1.88 m), weight 321 lb (145.605 kg). ECOG: 0 General appearance: alert, cooperative and no distress Head: Normocephalic, without obvious abnormality, atraumatic Neck: no adenopathy, no carotid bruit, no JVD, supple, symmetrical, trachea midline and thyroid not enlarged, symmetric, no tenderness/mass/nodules Lymph nodes: Cervical, supraclavicular, and axillary nodes normal. Heart:regular rate and rhythm, S1, S2 normal, no murmur, click, rub or gallop Lung:chest clear, no wheezing, rales, normal symmetric air entry, no tachypnea, retractions or cyanosis Phoresis cathter in place. No redness or drainage at insertion site. Abdomen: soft, non-tender, without masses or organomegaly EXT:no erythema, induration, or nodules Left scrotum is  enlarged. No masses palpated at this time.   Lab Results: Lab  Results  Component Value Date   WBC 6.0 10/17/2012   HGB 13.8 10/17/2012   HCT 40.0 10/17/2012   MCV 85.9 10/17/2012   PLT 130* 10/17/2012     Chemistry      Component Value Date/Time   NA 141 10/17/2012 0900   NA 139 06/09/2012 0956   K 4.3 10/17/2012 0900   K 4.4 06/09/2012 0956   CL 101 10/17/2012 0900   CL 102 06/09/2012 0956   CO2 30* 10/17/2012 0900   CO2 30 06/09/2012 0956   BUN 20.0 10/17/2012 0900   BUN 20 06/09/2012 0956   CREATININE 0.9 10/17/2012 0900   CREATININE 0.81 06/09/2012 0956      Component Value Date/Time   CALCIUM 9.2 10/17/2012 0900   CALCIUM 9.1 06/09/2012 0956   ALKPHOS 75 10/17/2012 0900   ALKPHOS 70 06/09/2012 0956   AST 28 10/17/2012 0900   AST 20 06/09/2012 0956   ALT 53 10/17/2012 0900   ALT 29 06/09/2012 0956   BILITOT 0.56 10/17/2012 0900   BILITOT 0.3 06/09/2012 0956     Results for Gregory Schneider (MRN [id number]) as of 11/23/2012 14:43  Ref. Range 10/17/2012 09:00  PSA Latest Range: <=4.00 ng/mL 5.41 (H)   IMPRESSION:  1. Innumerable skeletal metastases appears similar to the prior  examination, and no definite new skeletal lesions are identified.  2. Compared to the prior examination the prostate gland is  remarkable for some enhancing soft tissue which impresses upon the  bladder base and appears to extend into the seminal vesicles  bilaterally (left greater than right). This appears different than  the prior examination, but some of this may be related to a  different phase of contrast enhancement. However, clinical  correlation is recommended, as local recurrence of disease is  strongly suspected.  3. No other findings suspicious for new metastatic disease in the  abdomen or pelvis.  4. Low attenuation of the hepatic parenchyma, suggestive of  hepatic steatosis.  5. Mild colonic diverticulosis without findings to suggest acute  diverticulitis at this time.    Impression and Plan: This is a 57 year old gentleman with the following issues: 1.  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 He is S/P Provenge and did very well with it and after words. CT scan results discussed today with Gregory Schneider and Dr.Entrikin the reviewing radiologist. It appear that his systemic disease is controlled and he is failing locally. He is asymptomatic at this point. I discussed the options of treating him locally vs systemically. I will discuss his case in the next GU tumor board.  2. Androgen deprivation. He'll continue his Lupron every 4 months at [facility]. He is asking about possible orchiectomy  instead. I certainly have no problems with that if he prefers surgery.  3. Bony disease. He'll continue Zometa monthly at [facility]. He will continue his calcium and vitamin D supplements. 4. Followup. In 4-6 weeks after his possible surgery.  5. Left scrotal enlargement: He is following with Dr. Laverle Patter. He will likely need an U/S for possible mass vs hydrocele vs. Other etiologies.    [facility] 1/9/20143:26 PM

## 2012-11-28 ENCOUNTER — Other Ambulatory Visit: Payer: Self-pay | Admitting: Urology

## 2012-12-15 ENCOUNTER — Encounter (HOSPITAL_COMMUNITY): Payer: Self-pay | Admitting: Pharmacy Technician

## 2012-12-18 ENCOUNTER — Encounter (HOSPITAL_COMMUNITY): Payer: Self-pay

## 2012-12-18 ENCOUNTER — Encounter (HOSPITAL_COMMUNITY)
Admission: RE | Admit: 2012-12-18 | Discharge: 2012-12-18 | Disposition: A | Discharge status: Home / Self Care | Payer: BC Managed Care – PPO | Source: Ambulatory Visit | Attending: Urology | Admitting: Urology

## 2012-12-18 HISTORY — DX: Major depressive disorder, single episode, unspecified: F32.9

## 2012-12-18 HISTORY — DX: Depression, unspecified: F32.A

## 2012-12-18 HISTORY — DX: Pneumonia, unspecified organism: J18.9

## 2012-12-18 LAB — CBC
HCT: 39.3 % (ref 39.0–52.0)
Hemoglobin: 13.6 g/dL (ref 13.0–17.0)
MCH: 28.9 pg (ref 26.0–34.0)
MCHC: 34.6 g/dL (ref 30.0–36.0)
MCV: 83.6 fL (ref 78.0–100.0)
Platelets: 120 10*3/uL — ABNORMAL LOW (ref 150–400)
RBC: 4.7 MIL/uL (ref 4.22–5.81)
RDW: 13.5 % (ref 11.5–15.5)
WBC: 5.7 10*3/uL (ref 4.0–10.5)

## 2012-12-18 LAB — BASIC METABOLIC PANEL
BUN: 17 mg/dL (ref 6–23)
CO2: 26 mEq/L (ref 19–32)
Calcium: 9.1 mg/dL (ref 8.4–10.5)
Chloride: 100 mEq/L (ref 96–112)
Creatinine, Ser: 0.84 mg/dL (ref 0.50–1.35)
GFR calc Af Amer: >90 mL/min (ref >90)
GFR calc non Af Amer: >90 mL/min (ref >90)
Glucose, Bld: 122 mg/dL — ABNORMAL HIGH (ref 70–99)
Potassium: 4.2 mEq/L (ref 3.5–5.1)
Sodium: 136 mEq/L (ref 135–145)

## 2012-12-18 LAB — SURGICAL PCR SCREEN
MRSA, PCR: NEGATIVE
Staphylococcus aureus: POSITIVE — AB

## 2012-12-18 NOTE — Progress Notes (Signed)
12/18/12 0908  OBSTRUCTIVE SLEEP APNEA  Have you ever been diagnosed with sleep apnea through a sleep study? No  Do you snore loudly (loud enough to be heard through closed doors)?  1  Do you often feel tired, fatigued, or sleepy during the daytime? 0  Has anyone observed you stop breathing during your sleep? 0  Do you have, or are you being treated for high blood pressure? 0  BMI more than 35 kg/m2? 1  Age over 55 years old? 1  Neck circumference greater than 40 cm/18 inches? 0  Gender: 1  Obstructive Sleep Apnea Score 4   Score 4 or greater  Results sent to PCP

## 2012-12-18 NOTE — Patient Instructions (Addendum)
20 Korion Cuevas  12/18/2012   Your procedure is scheduled on: 12/25/12  Report to [facility] at 12:15PM.  Call this number if you have problems the morning of surgery [phone]: [phone]   Remember:   Do not eat food After Midnight on 12/24/12, clear liquid from midnight until 0845 am on 12/25/12 then nothing     Take these medicines the morning of surgery with A SIP OF WATER: zoloft   Do not wear jewelry, make-up or nail polish.  Do not wear lotions, powders, or perfumes. You may wear deodorant.  Do not shave 48 hours prior to surgery. Men may shave face and neck.  Do not bring valuables to the hospital.  Contacts, dentures or bridgework may not be worn into surgery.     Patients discharged the day of surgery will not be allowed to drive home.  Name and phone number of your driver: Shawna Orleans (wife) [phone]    Please read over the following fact sheets that you were given: MRSA Information, clear liquid fact sheet Birdie Sons, RN  pre op nurse call if needed [phone]    FAILURE TO FOLLOW THESE INSTRUCTIONS MAY RESULT IN CANCELLATION OF YOUR SURGERY   Patient Signature: ___________________________________________

## 2012-12-18 NOTE — Progress Notes (Signed)
Abnormal CBC results routed to Dr. Laverle Patter via [facility]

## 2012-12-25 ENCOUNTER — Encounter (HOSPITAL_COMMUNITY)
Admission: RE | Disposition: A | Discharge status: Home / Self Care | Payer: Self-pay | Source: Ambulatory Visit | Attending: Urology

## 2012-12-25 ENCOUNTER — Encounter (HOSPITAL_COMMUNITY): Payer: Self-pay | Admitting: *Deleted

## 2012-12-25 ENCOUNTER — Encounter (HOSPITAL_COMMUNITY): Payer: Self-pay | Admitting: Certified Registered Nurse Anesthetist

## 2012-12-25 ENCOUNTER — Ambulatory Visit (HOSPITAL_COMMUNITY): Payer: BC Managed Care – PPO | Admitting: Certified Registered Nurse Anesthetist

## 2012-12-25 ENCOUNTER — Ambulatory Visit (HOSPITAL_COMMUNITY)
Admission: RE | Admit: 2012-12-25 | Discharge: 2012-12-25 | Disposition: A | Discharge status: Home / Self Care | Payer: BC Managed Care – PPO | Source: Ambulatory Visit | Attending: Urology | Admitting: Urology

## 2012-12-25 DIAGNOSIS — N432 Other hydrocele: Secondary | ICD-10-CM | POA: Insufficient documentation

## 2012-12-25 DIAGNOSIS — C7951 Secondary malignant neoplasm of bone: Secondary | ICD-10-CM | POA: Insufficient documentation

## 2012-12-25 DIAGNOSIS — N400 Enlarged prostate without lower urinary tract symptoms: Secondary | ICD-10-CM | POA: Insufficient documentation

## 2012-12-25 DIAGNOSIS — E291 Testicular hypofunction: Secondary | ICD-10-CM | POA: Insufficient documentation

## 2012-12-25 DIAGNOSIS — Z79899 Other long term (current) drug therapy: Secondary | ICD-10-CM | POA: Insufficient documentation

## 2012-12-25 DIAGNOSIS — C61 Malignant neoplasm of prostate: Secondary | ICD-10-CM | POA: Insufficient documentation

## 2012-12-25 HISTORY — PX: ORCHIECTOMY: SHX2116

## 2012-12-25 HISTORY — PX: HYDROCELE EXCISION: SHX482

## 2012-12-25 SURGERY — HYDROCELECTOMY
Anesthesia: General | Site: Scrotum | Laterality: Left | Wound class: Clean

## 2012-12-25 MED ORDER — ACETAMINOPHEN 10 MG/ML IV SOLN: 1000.0000 mg | Freq: Once | INTRAVENOUS | Status: DC | PRN

## 2012-12-25 MED ORDER — CEFAZOLIN SODIUM-DEXTROSE 2-3 GM-% IV SOLR
INTRAVENOUS | Status: AC
  Filled 2012-12-25: qty 50

## 2012-12-25 MED ORDER — MIDAZOLAM HCL 5 MG/5ML IJ SOLN
INTRAMUSCULAR | Status: DC | PRN
  Administered 2012-12-25: 2 mg via INTRAVENOUS

## 2012-12-25 MED ORDER — OXYCODONE HCL 5 MG PO TABS: 5.0000 mg | ORAL_TABLET | Freq: Once | ORAL | Status: DC | PRN

## 2012-12-25 MED ORDER — GLYCOPYRROLATE 0.2 MG/ML IJ SOLN
INTRAMUSCULAR | Status: DC | PRN
  Administered 2012-12-25: 0.2 mg via INTRAVENOUS

## 2012-12-25 MED ORDER — OXYCODONE HCL 5 MG/5ML PO SOLN
5.0000 mg | Freq: Once | ORAL | Status: DC | PRN
  Filled 2012-12-25: qty 5

## 2012-12-25 MED ORDER — BUPIVACAINE HCL (PF) 0.25 % IJ SOLN
INTRAMUSCULAR | Status: AC
  Filled 2012-12-25: qty 30

## 2012-12-25 MED ORDER — DEXAMETHASONE SODIUM PHOSPHATE 10 MG/ML IJ SOLN
INTRAMUSCULAR | Status: DC | PRN
  Administered 2012-12-25: 10 mg via INTRAVENOUS

## 2012-12-25 MED ORDER — PROPOFOL 10 MG/ML IV BOLUS
INTRAVENOUS | Status: DC | PRN
  Administered 2012-12-25: 200 mg via INTRAVENOUS
  Administered 2012-12-25: 15 mg via INTRAVENOUS
  Administered 2012-12-25: 30 mg via INTRAVENOUS
  Administered 2012-12-25: 15 mg via INTRAVENOUS

## 2012-12-25 MED ORDER — 0.9 % SODIUM CHLORIDE (POUR BTL) OPTIME
TOPICAL | Status: DC | PRN
  Administered 2012-12-25: 1000 mL

## 2012-12-25 MED ORDER — MEPERIDINE HCL 50 MG/ML IJ SOLN: 6.2500 mg | INTRAMUSCULAR | Status: DC | PRN

## 2012-12-25 MED ORDER — ACETAMINOPHEN 10 MG/ML IV SOLN
INTRAVENOUS | Status: DC | PRN
  Administered 2012-12-25: 1000 mg via INTRAVENOUS

## 2012-12-25 MED ORDER — HYDROMORPHONE HCL PF 1 MG/ML IJ SOLN: 0.2500 mg | INTRAMUSCULAR | Status: DC | PRN

## 2012-12-25 MED ORDER — CEFAZOLIN SODIUM 1-5 GM-% IV SOLN
INTRAVENOUS | Status: AC
  Filled 2012-12-25: qty 50

## 2012-12-25 MED ORDER — FENTANYL CITRATE 0.05 MG/ML IJ SOLN
INTRAMUSCULAR | Status: DC | PRN
  Administered 2012-12-25: 100 ug via INTRAVENOUS
  Administered 2012-12-25 (×4): 50 ug via INTRAVENOUS

## 2012-12-25 MED ORDER — ONDANSETRON HCL 4 MG/2ML IJ SOLN
INTRAMUSCULAR | Status: DC | PRN
  Administered 2012-12-25: 4 mg via INTRAVENOUS

## 2012-12-25 MED ORDER — HYDROCODONE-ACETAMINOPHEN 5-325 MG PO TABS: 1.0000 | ORAL_TABLET | Freq: Four times a day (QID) | ORAL | Status: DC | PRN

## 2012-12-25 MED ORDER — ACETAMINOPHEN 10 MG/ML IV SOLN
INTRAVENOUS | Status: AC
  Filled 2012-12-25: qty 100

## 2012-12-25 MED ORDER — SUCCINYLCHOLINE CHLORIDE 20 MG/ML IJ SOLN
INTRAMUSCULAR | Status: DC | PRN
  Administered 2012-12-25: 100 mg via INTRAVENOUS

## 2012-12-25 MED ORDER — LACTATED RINGERS IV SOLN
INTRAVENOUS | Status: DC
  Administered 2012-12-25: 1000 mL via INTRAVENOUS
  Administered 2012-12-25: 15:00:00 via INTRAVENOUS

## 2012-12-25 MED ORDER — LIDOCAINE HCL (CARDIAC) 20 MG/ML IV SOLN
INTRAVENOUS | Status: DC | PRN
  Administered 2012-12-25: 100 mg via INTRAVENOUS

## 2012-12-25 MED ORDER — PROMETHAZINE HCL 25 MG/ML IJ SOLN: 6.2500 mg | INTRAMUSCULAR | Status: DC | PRN

## 2012-12-25 MED ORDER — DEXTROSE 5 % IV SOLN
3.0000 g | INTRAVENOUS | Status: AC
  Administered 2012-12-25: 3 g via INTRAVENOUS
  Filled 2012-12-25: qty 3000

## 2012-12-25 SURGICAL SUPPLY — 25 items
BANDAGE GAUZE ELAST BULKY 4 IN (GAUZE/BANDAGES/DRESSINGS) ×3 IMPLANT
BLADE HEX COATED 2.75 (ELECTRODE) ×3 IMPLANT
CLOTH BEACON ORANGE TIMEOUT ST (SAFETY) ×3 IMPLANT
COVER SURGICAL LIGHT HANDLE (MISCELLANEOUS) ×3 IMPLANT
DRAPE LAPAROTOMY T 102X78X121 (DRAPES) ×3 IMPLANT
ELECT REM PT RETURN 9FT ADLT (ELECTROSURGICAL) ×3
ELECTRODE REM PT RTRN 9FT ADLT (ELECTROSURGICAL) ×2 IMPLANT
GAUZE SPONGE 4X4 16PLY XRAY LF (GAUZE/BANDAGES/DRESSINGS) ×3 IMPLANT
GLOVE BIOGEL M STRL SZ7.5 (GLOVE) ×9 IMPLANT
GOWN STRL NON-REIN LRG LVL3 (GOWN DISPOSABLE) ×9 IMPLANT
KIT BASIN OR (CUSTOM PROCEDURE TRAY) ×3 IMPLANT
NEEDLE HYPO 22GX1.5 SAFETY (NEEDLE) IMPLANT
NS IRRIG 1000ML POUR BTL (IV SOLUTION) ×3 IMPLANT
PACK GENERAL/GYN (CUSTOM PROCEDURE TRAY) ×6 IMPLANT
SPONGE LAP 4X18 X RAY DECT (DISPOSABLE) ×6 IMPLANT
SUPPORT SCROTAL LG STRP (MISCELLANEOUS) ×3 IMPLANT
SUPPORT SCROTAL MED ADLT STRP (MISCELLANEOUS) IMPLANT
SUT CHROMIC 3 0 SH 27 (SUTURE) ×9 IMPLANT
SUT CHROMIC 4 0 RB 1X27 (SUTURE) ×3 IMPLANT
SUT SILK 2 0 SH (SUTURE) ×12 IMPLANT
SUT VIC AB 2-0 UR5 27 (SUTURE) ×3 IMPLANT
SUT VICRYL 0 TIES 12 18 (SUTURE) IMPLANT
SYR CONTROL 10ML LL (SYRINGE) IMPLANT
TOWEL OR 17X26 10 PK STRL BLUE (TOWEL DISPOSABLE) ×6 IMPLANT
WATER STERILE IRR 1500ML POUR (IV SOLUTION) ×3 IMPLANT

## 2012-12-25 NOTE — Preoperative (Signed)
Beta Blockers   Reason not to administer Beta Blockers:Not Applicable 

## 2012-12-25 NOTE — Transfer of Care (Signed)
Immediate Anesthesia Transfer of Care Note  Patient: Gregory Schneider  Procedure(s) Performed: Procedure(s) with comments: HYDROCELECTOMY ADULT (Left) - LEFT HYDROCELE REPAIR, BILATERAL SIMPLE ORCHIECTOMY  ORCHIECTOMY (Bilateral)  Patient Location: PACU  Anesthesia Type:General  Level of Consciousness: awake, sedated and patient cooperative  Airway & Oxygen Therapy: Patient Spontanous Breathing and Patient connected to face mask oxygen  Post-op Assessment: Report given to PACU RN  Post vital signs: Reviewed and stable  Complications: No apparent anesthesia complications

## 2012-12-25 NOTE — Anesthesia Preprocedure Evaluation (Addendum)
Anesthesia Evaluation  Patient identified by MRN, date of birth, ID band Patient awake    Reviewed: Allergy & Precautions, H&P , NPO status , Patient's Chart, lab work & pertinent test results  Airway Mallampati: I TM Distance: >3 FB Neck ROM: Full    Dental  (+) Teeth Intact, Dental Advisory Given and Missing   Pulmonary pneumonia -, resolved,  breath sounds clear to auscultation  Pulmonary exam normal       Cardiovascular negative cardio ROS  Rhythm:Regular Rate:Normal     Neuro/Psych PSYCHIATRIC DISORDERS Depression negative neurological ROS     GI/Hepatic negative GI ROS, Neg liver ROS,   Endo/Other  Morbid obesity  Renal/GU negative Renal ROS     Musculoskeletal negative musculoskeletal ROS (+)   Abdominal (+) + obese,   Peds  Hematology negative hematology ROS (+)   Anesthesia Other Findings   Reproductive/Obstetrics                          Anesthesia Physical Anesthesia Plan  ASA: II  Anesthesia Plan: General   Post-op Pain Management:    Induction: Intravenous  Airway Management Planned: LMA  Additional Equipment:   Intra-op Plan:   Post-operative Plan: Extubation in OR  Informed Consent: I have reviewed the patients History and Physical, chart, labs and discussed the procedure including the risks, benefits and alternatives for the proposed anesthesia with the patient or authorized representative who has indicated his/her understanding and acceptance.   Dental advisory given  Plan Discussed with: CRNA  Anesthesia Plan Comments:        Anesthesia Quick Evaluation

## 2012-12-25 NOTE — Op Note (Signed)
Preoperative diagnosis:  1. Left hydrocele  2. Metastatic prostate cancer  Postoperative diagnosis: 1. Left hydrocele 2. Metastatic prostate cancer  Procedures: 1. Repair of left hydrocele 2. Bilateral simple orchiectomy  Surgeon: Dr. Rolly Salter, Monte[place] Hageman.  Anesthesia: General  Complications: None  Estimated blood loss: Minimal  Specimens: Bilateral testes  Disposition of specimens: Pathology lab  Indication: Gregory Schneider is a 57 year old gentleman with castrate resistant metastatic prostate cancer. He recently presented with a symptomatic left hydrocele wish to undergo repair after discussing the options for treatment/management. In addition, considering his need for long-term androgen deprivation therapy for his metastatic prostate cancer, he also inquired about undergoing a bilateral simple orchiectomy to avoid the need for ongoing medical castration. This was felt to be a very appropriate option to assist with ongoing prostate cancer care. I discussed the potential benefits and risks of the procedure, side effects of the proposed treatment, the likelihood of the patient achieving the goals of the procedure, and any potential problems that might occur during the procedure or recuperation. He gave his informed consent.  Description of procedure: He was taken to the operating room and administered general anesthesia. He was administered preoperative antibiotics, placed in the supine position, and prepped and draped in the usual sterile fashion. Next a preoperative timeout was performed. An incision was then made in the midline of the scrotal raphae and carried down over the large left hydrocele. The dartos fascia was removed from the underlying tunica vaginalis and the testis and hydrocele was delivered into the operative field away from the scrotal skin. The hydrocele sac was then sharply incised with approximately 250 cc of serous fluid evacuated. Attention then turned to the spermatic  cord. The spermatic cord was carefully divided into 2 separate packets and clamped with a Kelly clamp. Metzenbaum scissors were then used to remove the left testis and epididymis. The spermatic cord packets were then ligated with 2-0 silk suture ligatures resulting in excellent hemostasis. The Kelly clamps were removed. The dartos fascia on this side was then closed with a 3-0 chromic suture. The right testis was then delivered in a similar fashion. The patient was noted to have a very small asymptomatic right sided hydrocele. Once the right testis was delivered and the hydrocele fluid evacuated, a simple orchiectomy was performed in identical procedure to the contralateral side. Again, excellent hemostasis was noted. The dartos fascia was again similarly closed with a running 3-0 chromic suture. The skin was then closed with a running 4-0 subcuticular chromic suture. Dermabond was applied to the skin. A fluff dressing was then placed. He tolerated the procedure well without complications. He was able to be awakened and transferred to the recovery unit in satisfactory condition.

## 2012-12-25 NOTE — H&P (Signed)
History of Present Illness  Gregory Schneider is a 57 year old previously followed by Dr. Earlene Plater with the following urologic history:  1) Prostate cancer: He had his initial PSA at age 88 at a prostate cancer screening which was found to be 11.0. He was evaluated by Dr. Earlene Plater and a repeat PSA was 15.70. He underwent a prostate biopsy which demostrated Gleason 4+5=9 adenocarcinoma of the prostate with 12/12 biopsy cores involved. Staging studies including a bone scan and CT scan imaging demonstrated multiple bone metastases. He began combined androgen blockade in March 2010 with Lupron and bicalutimide. He did receive prophylactic breast irradiation. His PSA subsequently became undetectable but began rising gradually in October 2011 indicating the beginning of CRPC. He was observed with a relatively slow rise in his PSA until January 2013. Staging studies at that time were suggestive of possible progression with some new osseous lesions but others that appeared improved in appearance on imaging. He did not meet enrollment criteria for the STRIVE enzalutamide trial and he then proceeded with medical oncology consultations at the [facility] with Dr. Clelia Croft and also at [facility].  He was treated with Provenge under the care of Dr. Clelia Croft.  2) Testosterone deficiency: He is treated with Zometa monthly and takes Vitamin D and calcium supplementation for osteoporosis prophylaxis. Last DXA: Aug 2012 (normal)  Interval history:  He recently presented for complaint of a new left-sided hydrocele. He noticed increased asymmetric swelling of the left hemiscrotum which began approximately one month ago. He was seen by Jetta Lout, NPC, and underwent a scrotal ultrasound. I independently reviewed the ultrasound. This demonstrates evidence of a simple left hydrocele with a homogeneous testis without evidence of testicular masses or other abnormalities. There is normal blood flow to the left testis. He states his hydrocele has  increased in size. It has become more symptomatic and he has elected to proceed with surgical treatment after he was counseled about his options including observation.     Past Medical History Problems  1. History of  Benign Prostatic Hypertrophy 600.00 2. Former Smoker 3. Prostate Cancer 185  Current Meds 1. Calcium + D TABS; Therapy: (Recorded:05Aug2011) to 2. Ibuprofen 800 MG Oral Tablet; Therapy: (Recorded:30Nov2009) to 3. Vitamin D TABS; Therapy: (Recorded:05Aug2011) to  Allergies Medication  1. Penicillins  Family History Problems  1. Paternal history of  Diabetes Mellitus V18.0 2. Paternal aunt's history of  Diabetes Mellitus V18.0 3. Paternal uncle's history of  Diabetes Mellitus V18.0 4. Paternal grandfather's history of  Prostate Cancer V16.42  Social History Problems  1. Alcohol Use maybe 2 servings daily 2. Marital History - Currently Married 3. Occupation: Landscaper  Physical Exam Constitutional: Well nourished and well developed . No acute distress.  Pulmonary: No respiratory distress and normal respiratory rhythm and effort.  Cardiovascular: Heart rate and rhythm are normal . No peripheral edema.  Genitourinary: Examination of the penis demonstrates no discharge, no masses, no lesions and a normal meatus. The scrotum is without lesions. Examination of the left scrotum demostrates a hydrocele. The right epididymis is palpably normal and non-tender. The left epididymis is palpably normal and non-tender. The right testis is non-tender and without masses. The left testis is non-tender and without masses. His left hemiscrotum is able to be transilluminated.    Results/Data  I independently reviewed his scrotal ultrasound findings as dictated above.     Assessment Assessed  1. Hydrocele Left 603.9 2. Prostate Cancer 185  Discussion/Summary  1. Simple left hydrocele/metastatic prostate cancer: He  has elected to proceed with surgical treatment of his  hydrocele. Furthermore, considering his castrate resistant metastatic prostate cancer and need for lifetime treatment with androgen deprivation therapy, we discussed the option of proceeding with a bilateral simple orchiectomy to allow him to discontinue his [facility] agonist therapy and provide more cost effective and convenient care going forward. He has discussed this with the Dr. Clelia Croft who agrees with this approach.  Mr. Hoffmeier therefore does wish to have a bilateral simple orchiectomy as well as left hydrocele repair. I discussed the potential benefits and risks of the procedure, side effects of the proposed treatment, the likelihood of the patient achieving the goals of the procedure, and any potential problems that might occur during the procedure or recuperation.

## 2012-12-25 NOTE — Anesthesia Postprocedure Evaluation (Signed)
  Anesthesia Post-op Note  Patient: Gregory Schneider  Procedure(s) Performed: Procedure(s) (LRB): HYDROCELECTOMY ADULT (Left) ORCHIECTOMY (Bilateral)  Patient Location: PACU  Anesthesia Type: General  Level of Consciousness: awake and alert   Airway and Oxygen Therapy: Patient Spontanous Breathing  Post-op Pain: mild  Post-op Assessment: Post-op Vital signs reviewed, Patient's Cardiovascular Status Stable, Respiratory Function Stable, Patent Airway and No signs of Nausea or vomiting  Last Vitals:  Filed Vitals:   12/25/12 1626  BP: 181/95  Pulse: 87  Temp: 36.5 C  Resp: 18    Post-op Vital Signs: stable   Complications: No apparent anesthesia complications

## 2012-12-26 ENCOUNTER — Encounter (HOSPITAL_COMMUNITY): Payer: Self-pay | Admitting: Urology

## 2013-01-09 ENCOUNTER — Telehealth: Payer: Self-pay | Admitting: Oncology

## 2013-01-09 ENCOUNTER — Other Ambulatory Visit (HOSPITAL_BASED_OUTPATIENT_CLINIC_OR_DEPARTMENT_OTHER): Payer: BC Managed Care – PPO | Admitting: Lab

## 2013-01-09 ENCOUNTER — Ambulatory Visit (HOSPITAL_BASED_OUTPATIENT_CLINIC_OR_DEPARTMENT_OTHER): Payer: BC Managed Care – PPO | Admitting: Oncology

## 2013-01-09 VITALS — BP 135/75 | HR 68 | Temp 97.2°F | Resp 18 | Wt 323.2 lb

## 2013-01-09 DIAGNOSIS — C61 Malignant neoplasm of prostate: Secondary | ICD-10-CM

## 2013-01-09 DIAGNOSIS — C7951 Secondary malignant neoplasm of bone: Secondary | ICD-10-CM

## 2013-01-09 LAB — COMPREHENSIVE METABOLIC PANEL (CC13)
ALT: 48 U/L (ref 0–55)
AST: 26 U/L (ref 5–34)
Albumin: 3.9 g/dL (ref 3.5–5.0)
Alkaline Phosphatase: 97 U/L (ref 40–150)
BUN: 17.7 mg/dL (ref 7.0–26.0)
CO2: 24 mEq/L (ref 22–29)
Calcium: 8.6 mg/dL (ref 8.4–10.4)
Chloride: 104 mEq/L (ref 98–107)
Creatinine: 0.9 mg/dL (ref 0.7–1.3)
Glucose: 139 mg/dl — ABNORMAL HIGH (ref 70–99)
Potassium: 3.9 mEq/L (ref 3.5–5.1)
Sodium: 138 mEq/L (ref 136–145)
Total Bilirubin: 0.41 mg/dL (ref 0.20–1.20)
Total Protein: 7.5 g/dL (ref 6.4–8.3)

## 2013-01-09 LAB — CBC WITH DIFFERENTIAL/PLATELET
BASO%: 0.5 % (ref 0.0–2.0)
Basophils Absolute: 0 10*3/uL (ref 0.0–0.1)
EOS%: 1.7 % (ref 0.0–7.0)
Eosinophils Absolute: 0.1 10*3/uL (ref 0.0–0.5)
HCT: 38.2 % — ABNORMAL LOW (ref 38.4–49.9)
HGB: 13.6 g/dL (ref 13.0–17.1)
LYMPH%: 21.4 % (ref 14.0–49.0)
MCH: 29.6 pg (ref 27.2–33.4)
MCHC: 35.6 g/dL (ref 32.0–36.0)
MCV: 83.3 fL (ref 79.3–98.0)
MONO#: 0.3 10*3/uL (ref 0.1–0.9)
MONO%: 4.6 % (ref 0.0–14.0)
NEUT#: 4.8 10*3/uL (ref 1.5–6.5)
NEUT%: 71.8 % (ref 39.0–75.0)
Platelets: 133 10*3/uL — ABNORMAL LOW (ref 140–400)
RBC: 4.58 10*6/uL (ref 4.20–5.82)
RDW: 13.8 % (ref 11.0–14.6)
WBC: 6.7 10*3/uL (ref 4.0–10.3)
lymph#: 1.4 10*3/uL (ref 0.9–3.3)

## 2013-01-09 LAB — PSA: PSA: 5.2 ng/mL — ABNORMAL HIGH (ref <=4.00)

## 2013-01-09 NOTE — Progress Notes (Signed)
Hematology and Oncology Follow Up Visit  Gregory Schneider [id number] [birth year] 57 y.o. 01/09/2013 9:51 AM Gregory Schneider, MDNo ref. provider found   Principle Diagnosis: 57 year old with prostate cancer dating back to 2010, where he was found to have an elevated PSA up to 15 and subsequently underwent a biopsy which showed a Gleason score 4 + 5 equals 9 in the right and the left apex of his prostate, indicating a poorly-differentiated adenocarcinoma of the prostate. He had sclerotic lesions in T7 and L2 at the time of diagnosis. 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  Prior Therapy: Combined androgen deprivation with Lupron and Casodex. He had a good response and then developed a rise in the PSA with castrate level testosterone.  He is S/P Bilateral simple orchiectomy done on 12/25/2012.   Current therapy: Provenge started on 03/03/12. Therapy Ended in 03/31/2012.  He continues to be on Zometa given at  [facility].  Interim History:  Gregory Schneider returns today for routine follow up. He completed Provenge without complications in 03/2012. He under went a bilateral orchiectomy done on 12/25/2012. He did well after that and feels a lot better after completing his surgery. He denies any pain today. No chest pain, shortness of breath, dyspnea, abdominal pain, nausea, vomiting. He denies any hematuria or difficulty voiding. He has no headaches or dizziness. Appetite and weight are stable. His performance status is at baseline. No fevers or sweats. He reports no new complaints at this time.   Medications: I have reviewed the patient's current medications. Current outpatient prescriptions:calcium carbonate (OS-CAL) 600 MG TABS, Take 600 mg by mouth daily., Disp: , Rfl: ;  cholecalciferol (VITAMIN D) 1000 UNITS tablet, Take 1,000 Units by mouth daily., Disp: , Rfl: ;  Multiple Vitamin (MULTIVITAMIN) tablet, Take 1 tablet by mouth daily., Disp: , Rfl: ;  sertraline (ZOLOFT) 100 MG tablet, Take 100 mg by mouth  every morning. , Disp: , Rfl:  Zoledronic Acid (ZOMETA) 4 MG/100ML IVPB, Inject 4 mg into the vein every 30 (thirty) days., Disp: , Rfl: ;  HYDROcodone-acetaminophen (NORCO/VICODIN) 5-325 MG per tablet, Take 1-2 tablets by mouth every 6 (six) hours as needed for pain., Disp: 30 tablet, Rfl: 0  Allergies:  Allergies  Allergen Reactions  . Penicillins Other (See Comments)    Unknown     Past Medical History, Surgical history, Social history, and Family History were reviewed and updated.  Review of Systems: Constitutional:  Negative for fever, chills, night sweats, anorexia, weight loss, pain. Cardiovascular: no chest pain or dyspnea on exertion Respiratory: no cough, shortness of breath, or wheezing Neurological: no TIA or stroke symptoms Dermatological: negative ENT: negative Skin: Negative. Gastrointestinal: no abdominal pain, change in bowel habits, or black or bloody stools Genito-Urinary: no dysuria, trouble voiding, or hematuria Hematological and Lymphatic: negative Breast: negative for breast lumps Musculoskeletal: negative Remaining ROS negative.  Physical Exam: Blood pressure 135/75, pulse 68, temperature 97.2 F (36.2 C), temperature source Oral, resp. rate 18, weight 323 lb 3 oz (146.597 kg). ECOG: 0 General appearance: alert, cooperative and no distress Head: Normocephalic, without obvious abnormality, atraumatic Neck: no adenopathy, no carotid bruit, no JVD, supple, symmetrical, trachea midline and thyroid not enlarged, symmetric, no tenderness/mass/nodules Lymph nodes: Cervical, supraclavicular, and axillary nodes normal. Heart:regular rate and rhythm, S1, S2 normal, no murmur, click, rub or gallop Lung:chest clear, no wheezing, rales, normal symmetric air entry, no tachypnea, retractions or cyanosis Phoresis cathter in place. No redness or drainage at insertion site. Abdomen: soft, non-tender, without  masses or organomegaly EXT:no erythema, induration, or  nodules Left scrotum is enlarged. No masses palpated at this time.   Lab Results: Lab Results  Component Value Date   WBC 6.7 01/09/2013   HGB 13.6 01/09/2013   HCT 38.2* 01/09/2013   MCV 83.3 01/09/2013   PLT 133* 01/09/2013     Chemistry      Component Value Date/Time   NA 136 12/18/2012 0945   NA 141 10/17/2012 0900   K 4.2 12/18/2012 0945   K 4.3 10/17/2012 0900   CL 100 12/18/2012 0945   CL 101 10/17/2012 0900   CO2 26 12/18/2012 0945   CO2 30* 10/17/2012 0900   BUN 17 12/18/2012 0945   BUN 20.0 10/17/2012 0900   CREATININE 0.84 12/18/2012 0945   CREATININE 0.9 10/17/2012 0900      Component Value Date/Time   CALCIUM 9.1 12/18/2012 0945   CALCIUM 9.2 10/17/2012 0900   ALKPHOS 75 10/17/2012 0900   ALKPHOS 70 06/09/2012 0956   AST 28 10/17/2012 0900   AST 20 06/09/2012 0956   ALT 53 10/17/2012 0900   ALT 29 06/09/2012 0956   BILITOT 0.56 10/17/2012 0900   BILITOT 0.3 06/09/2012 0956     Results for Gregory Schneider, Gregory Schneider (MRN [id number]) as of 11/23/2012 14:43  Ref. Range 10/17/2012 09:00  PSA Latest Range: <=4.00 ng/mL 5.41 (H)     Impression and Plan: This is a 57 year old gentleman with the following issues: 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 He is S/P Provenge and did very well with it and after words. CT scan from 11/2012 showed that he is failing locally. He is S/P orchiectomy done on 12/25/2012 and showed that his disease has spread to the testicles. He is asymptomatic at this point. I discussed the options of treating him locally vs systemically. I will discuss his case in the next GU tumor board. For now, We will continue close observation. I discussed possible systemic options including Ketoconazole and Zytiga.  2. Androgen deprivation.  He S/P Bilateral orchiectomy. 3. Bony disease. He'll continue Zometa monthly at [facility]. He will continue his calcium and vitamin D supplements. 4. Followup. In  6-8  weeks after his possible surgery.     Gregory Schneider 2/25/20149:51 AM

## 2013-01-09 NOTE — Telephone Encounter (Signed)
gv and pritned appt schedule for pt for April and May

## 2013-01-10 ENCOUNTER — Telehealth: Payer: Self-pay | Admitting: *Deleted

## 2013-01-10 NOTE — Telephone Encounter (Signed)
Per MD, notified pt PSA is coming down and we like that. Pt agreed, thanked me for the call. No further questions.

## 2013-01-10 NOTE — Telephone Encounter (Signed)
Message copied by GARNER, Gerald Leitz on Wed Jan 10, 2013 12:16 PM ------      Message from: Benjiman Core      Created: Wed Jan 10, 2013  9:54 AM       Please call the result of his PSA. It is coming down and he will like that. ------

## 2013-03-08 ENCOUNTER — Other Ambulatory Visit: Payer: Self-pay | Admitting: *Deleted

## 2013-03-08 DIAGNOSIS — C61 Malignant neoplasm of prostate: Secondary | ICD-10-CM

## 2013-03-09 ENCOUNTER — Other Ambulatory Visit (HOSPITAL_BASED_OUTPATIENT_CLINIC_OR_DEPARTMENT_OTHER): Payer: BC Managed Care – PPO

## 2013-03-09 DIAGNOSIS — C61 Malignant neoplasm of prostate: Secondary | ICD-10-CM

## 2013-03-09 LAB — CBC WITH DIFFERENTIAL/PLATELET
BASO%: 0.5 % (ref 0.0–2.0)
Basophils Absolute: 0 10*3/uL (ref 0.0–0.1)
EOS%: 2.1 % (ref 0.0–7.0)
Eosinophils Absolute: 0.1 10*3/uL (ref 0.0–0.5)
HCT: 39.2 % (ref 38.4–49.9)
HGB: 13.5 g/dL (ref 13.0–17.1)
LYMPH%: 22 % (ref 14.0–49.0)
MCH: 28.7 pg (ref 27.2–33.4)
MCHC: 34.5 g/dL (ref 32.0–36.0)
MCV: 83.3 fL (ref 79.3–98.0)
MONO#: 0.4 10*3/uL (ref 0.1–0.9)
MONO%: 5.8 % (ref 0.0–14.0)
NEUT#: 4.3 10*3/uL (ref 1.5–6.5)
NEUT%: 69.6 % (ref 39.0–75.0)
Platelets: 121 10*3/uL — ABNORMAL LOW (ref 140–400)
RBC: 4.71 10*6/uL (ref 4.20–5.82)
RDW: 13.5 % (ref 11.0–14.6)
WBC: 6.1 10*3/uL (ref 4.0–10.3)
lymph#: 1.3 10*3/uL (ref 0.9–3.3)

## 2013-03-09 LAB — COMPREHENSIVE METABOLIC PANEL (CC13)
ALT: 46 U/L (ref 0–55)
AST: 24 U/L (ref 5–34)
Albumin: 3.9 g/dL (ref 3.5–5.0)
Alkaline Phosphatase: 81 U/L (ref 40–150)
BUN: 15.4 mg/dL (ref 7.0–26.0)
CO2: 26 mEq/L (ref 22–29)
Calcium: 9.3 mg/dL (ref 8.4–10.4)
Chloride: 104 mEq/L (ref 98–107)
Creatinine: 0.9 mg/dL (ref 0.7–1.3)
Glucose: 100 mg/dl — ABNORMAL HIGH (ref 70–99)
Potassium: 3.8 mEq/L (ref 3.5–5.1)
Sodium: 140 mEq/L (ref 136–145)
Total Bilirubin: 0.37 mg/dL (ref 0.20–1.20)
Total Protein: 7.5 g/dL (ref 6.4–8.3)

## 2013-03-09 LAB — PSA: PSA: 6.64 ng/mL — ABNORMAL HIGH (ref <=4.00)

## 2013-04-10 ENCOUNTER — Ambulatory Visit: Payer: BC Managed Care – PPO | Admitting: Oncology

## 2013-04-10 ENCOUNTER — Other Ambulatory Visit: Payer: BC Managed Care – PPO

## 2013-05-01 ENCOUNTER — Encounter: Payer: Self-pay | Admitting: *Deleted

## 2013-05-04 NOTE — Telephone Encounter (Signed)
e

## 2013-05-23 ENCOUNTER — Telehealth: Payer: Self-pay | Admitting: Oncology

## 2013-05-23 NOTE — Telephone Encounter (Signed)
Talked to pt and r/s appt tp 7/25 lab and MD per pt rqst

## 2013-06-07 ENCOUNTER — Telehealth: Payer: Self-pay | Admitting: Oncology

## 2013-06-07 ENCOUNTER — Other Ambulatory Visit (HOSPITAL_BASED_OUTPATIENT_CLINIC_OR_DEPARTMENT_OTHER): Payer: BC Managed Care – PPO | Admitting: Lab

## 2013-06-07 ENCOUNTER — Ambulatory Visit (HOSPITAL_BASED_OUTPATIENT_CLINIC_OR_DEPARTMENT_OTHER): Payer: BC Managed Care – PPO | Admitting: Oncology

## 2013-06-07 VITALS — BP 131/83 | HR 70 | Temp 97.0°F | Resp 18 | Ht 75.0 in | Wt 326.3 lb

## 2013-06-07 DIAGNOSIS — C61 Malignant neoplasm of prostate: Secondary | ICD-10-CM

## 2013-06-07 LAB — CBC WITH DIFFERENTIAL/PLATELET
BASO%: 0.7 % (ref 0.0–2.0)
Basophils Absolute: 0.1 10*3/uL (ref 0.0–0.1)
EOS%: 3.3 % (ref 0.0–7.0)
Eosinophils Absolute: 0.2 10*3/uL (ref 0.0–0.5)
HCT: 38.8 % (ref 38.4–49.9)
HGB: 13.7 g/dL (ref 13.0–17.1)
LYMPH%: 25.1 % (ref 14.0–49.0)
MCH: 30.1 pg (ref 27.2–33.4)
MCHC: 35.3 g/dL (ref 32.0–36.0)
MCV: 85.4 fL (ref 79.3–98.0)
MONO#: 0.4 10*3/uL (ref 0.1–0.9)
MONO%: 5.5 % (ref 0.0–14.0)
NEUT#: 4.7 10*3/uL (ref 1.5–6.5)
NEUT%: 65.4 % (ref 39.0–75.0)
Platelets: 116 10*3/uL — ABNORMAL LOW (ref 140–400)
RBC: 4.55 10*6/uL (ref 4.20–5.82)
RDW: 14 % (ref 11.0–14.6)
WBC: 7.1 10*3/uL (ref 4.0–10.3)
lymph#: 1.8 10*3/uL (ref 0.9–3.3)

## 2013-06-07 NOTE — Progress Notes (Signed)
Hematology and Oncology Follow Up Visit  Gregory Schneider [id number] 03-May-1956 57 y.o. 06/07/2013 8:36 AM Gregory Lerner, MD   Principle Diagnosis: 57 year old with prostate cancer dating back to 2010, where he was found to have an elevated PSA up to 15 and subsequently underwent a biopsy which showed a Gleason score 4 + 5 equals 9 in the right and the left apex of his prostate, indicating a poorly-differentiated adenocarcinoma of the prostate. He had sclerotic lesions in T7 and L2 at the time of diagnosis. 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  Prior Therapy: Combined androgen deprivation with Lupron and Casodex. He had a good response and then developed a rise in the PSA with castrate level testosterone.  He is S/P Bilateral simple orchiectomy done on 12/25/2012.   Current therapy: Provenge started on 03/03/12. Therapy Ended in 03/31/2012.  He continues to be on Zometa given at  [facility].  Interim History:  Gregory Schneider returns today for routine follow up. He completed Provenge without complications in 03/2012. He under went a bilateral orchiectomy done on 12/25/2012. He did well after that and feels a lot better since his last surgery. He denies any pain today. No chest pain, shortness of breath, dyspnea, abdominal pain, nausea, vomiting. He denies any hematuria. He has no headaches or dizziness. Appetite and weight are stable. His performance status is at baseline. No fevers or sweats. He reports frequency and urgency with urination. He was started on Oxybutynin without much improvement.  No pain or discomfort in the pelvic area. No bowel habit changes.    Medications: I have reviewed the patient's current medications.  Current Outpatient Prescriptions  Medication Sig Dispense Refill  . calcium carbonate (OS-CAL) 600 MG TABS Take 600 mg by mouth daily.      . cholecalciferol (VITAMIN D) 1000 UNITS tablet Take 1,000 Units by mouth daily.      Marland Kitchen HYDROcodone-acetaminophen  (NORCO/VICODIN) 5-325 MG per tablet Take 1-2 tablets by mouth every 6 (six) hours as needed for pain.  30 tablet  0  . Multiple Vitamin (MULTIVITAMIN) tablet Take 1 tablet by mouth daily.      . sertraline (ZOLOFT) 100 MG tablet Take 100 mg by mouth every morning.       . Zoledronic Acid (ZOMETA) 4 MG/100ML IVPB Inject 4 mg into the vein every 30 (thirty) days.       No current facility-administered medications for this visit.    Allergies:  Allergies  Allergen Reactions  . Penicillins Other (See Comments)    Unknown     Past Medical History, Surgical history, Social history, and Family History were reviewed and updated.  Review of Systems: Per HPI and otherwise negative.  Physical Exam: Blood pressure 131/83, pulse 70, temperature 97 F (36.1 C), temperature source Oral, resp. rate 18, height 6\' 3"  (1.905 m), weight 326 lb 4.8 oz (148.009 kg). ECOG: 0 General appearance: alert, cooperative and no distress Head: Normocephalic, without obvious abnormality, atraumatic Neck: no adenopathy, no carotid bruit, no JVD, supple, symmetrical, trachea midline and thyroid not enlarged, symmetric, no tenderness/mass/nodules Lymph nodes: Cervical, supraclavicular, and axillary nodes normal. Heart:regular rate and rhythm, S1, S2 normal, no murmur, click, rub or gallop Lung:chest clear, no wheezing, rales, normal symmetric air entry, no tachypnea, retractions or cyanosis Phoresis cathter in place. No redness or drainage at insertion site. Abdomen: soft, non-tender, without masses or organomegaly EXT:no erythema, induration, or nodules Left scrotum is enlarged. No masses palpated at this time.   Lab  Results: Lab Results  Component Value Date   WBC 7.1 06/07/2013   HGB 13.7 06/07/2013   HCT 38.8 06/07/2013   MCV 85.4 06/07/2013   PLT 116* 06/07/2013     Chemistry      Component Value Date/Time   NA 140 03/09/2013 0921   NA 136 12/18/2012 0945   K 3.8 03/09/2013 0921   K 4.2 12/18/2012 0945   CL  104 03/09/2013 0921   CL 100 12/18/2012 0945   CO2 26 03/09/2013 0921   CO2 26 12/18/2012 0945   BUN 15.4 03/09/2013 0921   BUN 17 12/18/2012 0945   CREATININE 0.9 03/09/2013 0921   CREATININE 0.84 12/18/2012 0945      Component Value Date/Time   CALCIUM 9.3 03/09/2013 0921   CALCIUM 9.1 12/18/2012 0945   ALKPHOS 81 03/09/2013 0921   ALKPHOS 70 06/09/2012 0956   AST 24 03/09/2013 0921   AST 20 06/09/2012 0956   ALT 46 03/09/2013 0921   ALT 29 06/09/2012 0956   BILITOT 0.37 03/09/2013 0921   BILITOT 0.3 06/09/2012 0956      Results for Gregory Schneider (MRN [id number]) as of 06/07/2013 08:41  Ref. Range 01/09/2013 09:06 03/09/2013 09:21  PSA Latest Range: <=4.00 ng/mL 5.20 (H) 6.64 (H)     Impression and Plan: This is a 57 year old gentleman with the following issues: 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 He is S/P Provenge and did very well with it and after words. CT scan from 11/2012 showed that he is failing locally. He is S/P orchiectomy done on 12/25/2012 and showed that his disease has spread to the testicles. He is asymptomatic at this point. He is reporting symptoms of urgency and frequency. This is could be due to enlarging tumor and we will need to investigate that. I will obtain CT scan and PSA. If his tumor is spreading systemically, then we will treat with Zytiga. If his disease continues to be localized and causing urinary symptoms, we will consider local therapy with radiation.    2. Androgen deprivation.  He S/P Bilateral orchiectomy. 3. Bony disease. He'll continue Zometa monthly at [facility]. He will continue his calcium and vitamin D supplements. 4. Followup. In 12 weeks and possibly sooner if therapy needs to be started.     Shelvia Fojtik 7/24/20148:36 AM

## 2013-06-07 NOTE — Addendum Note (Signed)
Addended by: Reesa Chew on: 06/07/2013 09:35 AM   Modules accepted: Medications

## 2013-06-07 NOTE — Telephone Encounter (Signed)
Gave pt oral contrast for Ct, pt will see md on October 2014 lab and MD, gave Ct order to [facility] for precert

## 2013-06-12 ENCOUNTER — Ambulatory Visit (HOSPITAL_COMMUNITY)
Admission: RE | Admit: 2013-06-12 | Discharge: 2013-06-12 | Disposition: A | Discharge status: Home / Self Care | Payer: BC Managed Care – PPO | Source: Ambulatory Visit | Attending: Oncology | Admitting: Oncology

## 2013-06-12 DIAGNOSIS — C7951 Secondary malignant neoplasm of bone: Secondary | ICD-10-CM | POA: Insufficient documentation

## 2013-06-12 DIAGNOSIS — K7689 Other specified diseases of liver: Secondary | ICD-10-CM | POA: Insufficient documentation

## 2013-06-12 DIAGNOSIS — C61 Malignant neoplasm of prostate: Secondary | ICD-10-CM | POA: Insufficient documentation

## 2013-06-12 MED ORDER — IOHEXOL 300 MG/ML  SOLN
125.0000 mL | Freq: Once | INTRAMUSCULAR | Status: AC | PRN
  Administered 2013-06-12: 125 mL via INTRAVENOUS

## 2013-09-07 ENCOUNTER — Other Ambulatory Visit (HOSPITAL_BASED_OUTPATIENT_CLINIC_OR_DEPARTMENT_OTHER): Payer: BC Managed Care – PPO | Admitting: Lab

## 2013-09-07 ENCOUNTER — Ambulatory Visit (HOSPITAL_BASED_OUTPATIENT_CLINIC_OR_DEPARTMENT_OTHER): Payer: BC Managed Care – PPO | Admitting: Oncology

## 2013-09-07 ENCOUNTER — Encounter (INDEPENDENT_AMBULATORY_CARE_PROVIDER_SITE_OTHER): Payer: Self-pay

## 2013-09-07 VITALS — BP 144/95 | HR 69 | Temp 97.0°F | Resp 20 | Ht 75.0 in | Wt 329.7 lb

## 2013-09-07 DIAGNOSIS — C61 Malignant neoplasm of prostate: Secondary | ICD-10-CM

## 2013-09-07 DIAGNOSIS — C7951 Secondary malignant neoplasm of bone: Secondary | ICD-10-CM

## 2013-09-07 DIAGNOSIS — M899 Disorder of bone, unspecified: Secondary | ICD-10-CM

## 2013-09-07 DIAGNOSIS — R350 Frequency of micturition: Secondary | ICD-10-CM

## 2013-09-07 LAB — COMPREHENSIVE METABOLIC PANEL (CC13)
ALT: 37 U/L (ref 0–55)
AST: 22 U/L (ref 5–34)
Albumin: 3.6 g/dL (ref 3.5–5.0)
Alkaline Phosphatase: 113 U/L (ref 40–150)
Anion Gap: 9 mEq/L (ref 3–11)
BUN: 16.2 mg/dL (ref 7.0–26.0)
CO2: 28 mEq/L (ref 22–29)
Calcium: 9.4 mg/dL (ref 8.4–10.4)
Chloride: 103 mEq/L (ref 98–109)
Creatinine: 0.9 mg/dL (ref 0.7–1.3)
Glucose: 99 mg/dl (ref 70–140)
Potassium: 4 mEq/L (ref 3.5–5.1)
Sodium: 139 mEq/L (ref 136–145)
Total Bilirubin: 0.38 mg/dL (ref 0.20–1.20)
Total Protein: 7 g/dL (ref 6.4–8.3)

## 2013-09-07 LAB — CBC WITH DIFFERENTIAL/PLATELET
BASO%: 0.7 % (ref 0.0–2.0)
Basophils Absolute: 0 10*3/uL (ref 0.0–0.1)
EOS%: 3.1 % (ref 0.0–7.0)
Eosinophils Absolute: 0.2 10*3/uL (ref 0.0–0.5)
HCT: 38.2 % — ABNORMAL LOW (ref 38.4–49.9)
HGB: 13 g/dL (ref 13.0–17.1)
LYMPH%: 26.7 % (ref 14.0–49.0)
MCH: 28.6 pg (ref 27.2–33.4)
MCHC: 34 g/dL (ref 32.0–36.0)
MCV: 84.2 fL (ref 79.3–98.0)
MONO#: 0.3 10*3/uL (ref 0.1–0.9)
MONO%: 5.5 % (ref 0.0–14.0)
NEUT#: 3.7 10*3/uL (ref 1.5–6.5)
NEUT%: 64 % (ref 39.0–75.0)
Platelets: 105 10*3/uL — ABNORMAL LOW (ref 140–400)
RBC: 4.53 10*6/uL (ref 4.20–5.82)
RDW: 14 % (ref 11.0–14.6)
WBC: 5.8 10*3/uL (ref 4.0–10.3)
lymph#: 1.5 10*3/uL (ref 0.9–3.3)

## 2013-09-07 LAB — PSA: PSA: 30.19 ng/mL — ABNORMAL HIGH (ref <=4.00)

## 2013-09-07 MED ORDER — HYDROCODONE-ACETAMINOPHEN 5-325 MG PO TABS: 1.0000 | ORAL_TABLET | Freq: Four times a day (QID) | ORAL | Status: DC | PRN

## 2013-09-07 NOTE — Progress Notes (Signed)
Hematology and Oncology Follow Up Visit  Gregory Schneider [id number] 1956-11-10 57 y.o. 09/07/2013 9:16 AM Gregory Schneider, MDBabaoff, Lavada Mesi, MD   Principle Diagnosis: 57 year old with prostate cancer dating back to 2010, where he was found to have an elevated PSA up to 15 and subsequently underwent a biopsy which showed a Gleason score 4 + 5 equals 9 in the right and the left apex of his prostate, indicating a poorly-differentiated adenocarcinoma of the prostate. He had sclerotic lesions in T7 and L2 at the time of diagnosis. 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  Prior Therapy: Combined androgen deprivation with Lupron and Casodex. He had a good response and then developed a rise in the PSA with castrate level testosterone.  He is S/P Bilateral simple orchiectomy done on 12/25/2012.   Current therapy: Provenge started on 03/03/12. Therapy Ended in 03/31/2012.  He continues to be on Zometa given at  [facility].  Interim History:  Gregory Schneider returns today for routine follow up. Since his last visit he has been doing well. He underwent a bilateral orchiectomy done on 12/25/2012. He did well after that and feels a lot better since his last surgery. He denies any pain today. No chest pain, shortness of breath, dyspnea, abdominal pain, nausea, vomiting. He denies any hematuria. He has no headaches or dizziness. Appetite and weight are stable. His performance status is at baseline. No fevers or sweats. He reports frequency and urgency with urination. He is following with Gregory Schneider regarding that. No new pain at this point.    Medications: I have reviewed the patient's current medications.  Current Outpatient Prescriptions  Medication Sig Dispense Refill  . calcium carbonate (OS-CAL) 600 MG TABS Take 600 mg by mouth daily.      . cholecalciferol (VITAMIN D) 1000 UNITS tablet Take 1,000 Units by mouth daily.      Marland Kitchen HYDROcodone-acetaminophen (NORCO/VICODIN) 5-325 MG per tablet Take 1-2 tablets by  mouth every 6 (six) hours as needed for pain.  30 tablet  0  . Mirabegron (MYRBETRIQ PO) Take 2 mg by mouth daily.      . Multiple Vitamin (MULTIVITAMIN) tablet Take 1 tablet by mouth daily.      . sertraline (ZOLOFT) 100 MG tablet Take 100 mg by mouth every morning.       . tamsulosin (FLOMAX) 0.4 MG CAPS capsule Take 0.4 mg by mouth 2 (two) times daily.      . Zoledronic Acid (ZOMETA) 4 MG/100ML IVPB Inject 4 mg into the vein every 30 (thirty) days.       No current facility-administered medications for this visit.    Allergies:  Allergies  Allergen Reactions  . Penicillins Other (See Comments)    Unknown     Past Medical History, Surgical history, Social history, and Family History were reviewed and updated.  Review of Systems: Per HPI and otherwise negative.  Physical Exam: Blood pressure 144/95, pulse 69, temperature 97 F (36.1 C), temperature source Oral, resp. rate 20, height 6\' 3"  (1.905 m), weight 329 lb 11.2 oz (149.551 kg). ECOG: 0 General appearance: alert, cooperative and no distress Head: Normocephalic, without obvious abnormality, atraumatic Neck: no adenopathy, no carotid bruit, no JVD, supple, symmetrical, trachea midline and thyroid not enlarged, symmetric, no tenderness/mass/nodules Lymph nodes: Cervical, supraclavicular, and axillary nodes normal. Heart:regular rate and rhythm, S1, S2 normal, no murmur, click, rub or gallop Lung:chest clear, no wheezing, rales, normal symmetric air entry, no tachypnea, retractions or cyanosis Phoresis cathter in  place. No redness or drainage at insertion site. Abdomen: soft, non-tender, without masses or organomegaly EXT:no erythema, induration, or nodules Left scrotum is enlarged. No masses palpated at this time.   Lab Results: Lab Results  Component Value Date   WBC 5.8 09/07/2013   HGB 13.0 09/07/2013   HCT 38.2* 09/07/2013   MCV 84.2 09/07/2013   PLT 105* 09/07/2013     Chemistry      Component Value Date/Time    NA 140 03/09/2013 0921   NA 136 12/18/2012 0945   K 3.8 03/09/2013 0921   K 4.2 12/18/2012 0945   CL 104 03/09/2013 0921   CL 100 12/18/2012 0945   CO2 26 03/09/2013 0921   CO2 26 12/18/2012 0945   BUN 15.4 03/09/2013 0921   BUN 17 12/18/2012 0945   CREATININE 0.9 03/09/2013 0921   CREATININE 0.84 12/18/2012 0945      Component Value Date/Time   CALCIUM 9.3 03/09/2013 0921   CALCIUM 9.1 12/18/2012 0945   ALKPHOS 81 03/09/2013 0921   ALKPHOS 70 06/09/2012 0956   AST 24 03/09/2013 0921   AST 20 06/09/2012 0956   ALT 46 03/09/2013 0921   ALT 29 06/09/2012 0956   BILITOT 0.37 03/09/2013 0921   BILITOT 0.3 06/09/2012 0956      Results for Gregory Schneider (MRN [id number]) as of 06/07/2013 08:41  Ref. Range 01/09/2013 09:06 03/09/2013 09:21  PSA Latest Range: <=4.00 ng/mL 5.20 (H) 6.64 (H)     Impression and Plan: This is a 57 year old gentleman with the following issues: 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 He is S/P Provenge and did very well with it and after words. CT scan from 11/2012 showed that he is failing locally. He is S/P orchiectomy done on 12/25/2012 and showed that his disease has spread to the testicles. He is asymptomatic at this point. His disease has been relatively stable and consider Zytiga upon further systemic progression 2. Androgen deprivation.  He S/P Bilateral orchiectomy. 3. Bony disease. He'll continue Zometa monthly at [facility]. He will continue his calcium and vitamin D supplements. 4. Followup. In 12 weeks and possibly sooner if therapy needs to be started.     Gregory Schneider 10/24/20149:16 AM

## 2013-09-10 ENCOUNTER — Telehealth: Payer: Self-pay | Admitting: Oncology

## 2013-09-10 NOTE — Telephone Encounter (Signed)
s.w. pt and advised on Jan 2015 appt....pt ok and awre °

## 2013-09-24 ENCOUNTER — Telehealth: Payer: Self-pay | Admitting: *Deleted

## 2013-09-24 NOTE — Telephone Encounter (Signed)
Pt called wanting to know of next office visit appt will be since his PSA level has been increased.  Pt wanted to know what Dr. Clelia Croft would recommend for therapy. Pt currently has f/u appt on  12/11/13 ,  But pt did not think he should wait until January.   Message left on Dr. Alver Fisher desk for further instructions on 09/25/13 when md returns to office. Pt's  Phone   [phone].

## 2013-09-26 ENCOUNTER — Telehealth: Payer: Self-pay | Admitting: *Deleted

## 2013-09-26 ENCOUNTER — Other Ambulatory Visit: Payer: Self-pay | Admitting: Oncology

## 2013-09-26 DIAGNOSIS — C61 Malignant neoplasm of prostate: Secondary | ICD-10-CM

## 2013-09-26 NOTE — Telephone Encounter (Signed)
Patient calling to say he feels like he is sitting on a tennis ball and it is difficult to urinate or have a BM. Per dr Clelia Croft, he would like to get a CT scan and call him with the results. POF to schedulers.

## 2013-10-08 ENCOUNTER — Encounter (HOSPITAL_COMMUNITY): Payer: Self-pay

## 2013-10-08 ENCOUNTER — Ambulatory Visit (HOSPITAL_COMMUNITY)
Admission: RE | Admit: 2013-10-08 | Discharge: 2013-10-08 | Disposition: A | Discharge status: Home / Self Care | Payer: BC Managed Care – PPO | Source: Ambulatory Visit | Attending: Oncology | Admitting: Oncology

## 2013-10-08 DIAGNOSIS — C61 Malignant neoplasm of prostate: Secondary | ICD-10-CM

## 2013-10-08 DIAGNOSIS — K7689 Other specified diseases of liver: Secondary | ICD-10-CM | POA: Insufficient documentation

## 2013-10-08 DIAGNOSIS — K5730 Diverticulosis of large intestine without perforation or abscess without bleeding: Secondary | ICD-10-CM | POA: Insufficient documentation

## 2013-10-08 DIAGNOSIS — K4090 Unilateral inguinal hernia, without obstruction or gangrene, not specified as recurrent: Secondary | ICD-10-CM | POA: Insufficient documentation

## 2013-10-08 DIAGNOSIS — C7951 Secondary malignant neoplasm of bone: Secondary | ICD-10-CM | POA: Insufficient documentation

## 2013-10-08 MED ORDER — IOHEXOL 300 MG/ML  SOLN
100.0000 mL | Freq: Once | INTRAMUSCULAR | Status: AC | PRN
  Administered 2013-10-08: 125 mL via INTRAVENOUS

## 2013-10-09 ENCOUNTER — Telehealth: Payer: Self-pay | Admitting: *Deleted

## 2013-10-09 ENCOUNTER — Other Ambulatory Visit: Payer: Self-pay | Admitting: *Deleted

## 2013-10-09 NOTE — Telephone Encounter (Signed)
Patient calling to ask  For results of CT scan done yesterday. Scan unchanged, but patient still wants to see dr Clelia Croft. Per dr Clelia Croft, okay to schedule patient for  10/10/13 @ 9:00. Patient aware and pof sent to schedulers.

## 2013-10-10 ENCOUNTER — Ambulatory Visit (HOSPITAL_BASED_OUTPATIENT_CLINIC_OR_DEPARTMENT_OTHER): Payer: BC Managed Care – PPO | Admitting: Oncology

## 2013-10-10 ENCOUNTER — Telehealth: Payer: Self-pay | Admitting: Oncology

## 2013-10-10 ENCOUNTER — Encounter (INDEPENDENT_AMBULATORY_CARE_PROVIDER_SITE_OTHER): Payer: Self-pay

## 2013-10-10 ENCOUNTER — Encounter: Payer: Self-pay | Admitting: Oncology

## 2013-10-10 VITALS — BP 137/90 | HR 93 | Temp 98.3°F | Resp 18 | Ht 75.0 in | Wt 330.3 lb

## 2013-10-10 DIAGNOSIS — E291 Testicular hypofunction: Secondary | ICD-10-CM

## 2013-10-10 DIAGNOSIS — C61 Malignant neoplasm of prostate: Secondary | ICD-10-CM

## 2013-10-10 DIAGNOSIS — C7951 Secondary malignant neoplasm of bone: Secondary | ICD-10-CM

## 2013-10-10 MED ORDER — ABIRATERONE ACETATE 250 MG PO TABS: 1000.0000 mg | ORAL_TABLET | Freq: Every day | ORAL | Status: DC

## 2013-10-10 NOTE — Progress Notes (Signed)
Script for [employer] given to ebony in managed care for financial assistance.

## 2013-10-10 NOTE — Telephone Encounter (Signed)
gv and pritned appt sched and avs for pt for Jan 2015

## 2013-10-10 NOTE — Progress Notes (Signed)
Hematology and Oncology Follow Up Visit  Gregory Schneider [id number] 1956/08/30 57 y.o. 10/10/2013 9:44 AM BABAOFF, Lavada Mesi, MDBabaoff, Lavada Mesi, MD   Principle Diagnosis: 57 year old with prostate cancer dating back to 2010, where he was found to have an elevated PSA up to 15 and subsequently underwent a biopsy which showed a Gleason score 4 + 5 equals 9 in the right and the left apex of his prostate, indicating a poorly-differentiated adenocarcinoma of the prostate. He had sclerotic lesions in T7 and L2 at the time of diagnosis. 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  Prior Therapy: Combined androgen deprivation with Lupron and Casodex. He had a good response and then developed a rise in the PSA with castrate level testosterone.  He is S/P Bilateral simple orchiectomy done on 12/25/2012.   Current therapy: Provenge started on 03/03/12. Therapy Ended in 03/31/2012.  He continues to be on Zometa given at  [facility].  Interim History:  [facility] returns today for routine follow up. Since his last visit he has been doing well. He denies any pain today. No chest pain, shortness of breath, dyspnea, abdominal pain, nausea, vomiting. He denies any hematuria. He has no headaches or dizziness. Appetite and weight are stable. His performance status is at baseline. No fevers or sweats. He reports frequency and urgency with urination. He is reporting urination difficulties at times and some occasional difficulty she and problems. He did not report any hematuria or hematochezia. Does not report any increase in his bone pain.  Medications: I have reviewed the patient's current medications.  Current Outpatient Prescriptions  Medication Sig Dispense Refill  . abiraterone Acetate (ZYTIGA) 250 MG tablet Take 4 tablets (1,000 mg total) by mouth daily. Take on an empty stomach 1 hour before or 2 hours after a meal  120 tablet  0  . calcium carbonate (OS-CAL) 600 MG TABS Take 600 mg by mouth daily.      .  cholecalciferol (VITAMIN D) 1000 UNITS tablet Take 1,000 Units by mouth daily.      Marland Kitchen HYDROcodone-acetaminophen (NORCO/VICODIN) 5-325 MG per tablet Take 1-2 tablets by mouth every 6 (six) hours as needed for pain.  30 tablet  0  . Mirabegron (MYRBETRIQ PO) Take 2 mg by mouth daily.      . Multiple Vitamin (MULTIVITAMIN) tablet Take 1 tablet by mouth daily.      . sertraline (ZOLOFT) 100 MG tablet Take 100 mg by mouth every morning.       . tamsulosin (FLOMAX) 0.4 MG CAPS capsule Take 0.4 mg by mouth 2 (two) times daily.      . Zoledronic Acid (ZOMETA) 4 MG/100ML IVPB Inject 4 mg into the vein every 30 (thirty) days.       No current facility-administered medications for this visit.    Allergies:  Allergies  Allergen Reactions  . Penicillins Other (See Comments)    Unknown     Past Medical History, Surgical history, Social history, and Family History were reviewed and updated.  Review of Systems: Per HPI and otherwise negative.  Physical Exam: Blood pressure 137/90, pulse 93, temperature 98.3 F (36.8 C), temperature source Oral, resp. rate 18, height 6\' 3"  (1.905 m), weight 330 lb 4.8 oz (149.823 kg). ECOG: 0 General appearance: alert, cooperative and no distress Head: Normocephalic, without obvious abnormality, atraumatic Neck: no adenopathy, no carotid bruit, no JVD, supple, symmetrical, trachea midline and thyroid not enlarged, symmetric, no tenderness/mass/nodules Lymph nodes: Cervical, supraclavicular, and axillary nodes normal. Heart:regular  rate and rhythm, S1, S2 normal, no murmur, click, rub or gallop Lung:chest clear, no wheezing, rales, normal symmetric air entry, no tachypnea, retractions or cyanosis Phoresis cathter in place. No redness or drainage at insertion site. Abdomen: soft, non-tender, without masses or organomegaly EXT:no erythema, induration, or nodules Left scrotum is enlarged. No masses palpated at this time.   Lab Results: Lab Results  Component Value  Date   WBC 5.8 09/07/2013   HGB 13.0 09/07/2013   HCT 38.2* 09/07/2013   MCV 84.2 09/07/2013   PLT 105* 09/07/2013     Chemistry      Component Value Date/Time   NA 139 09/07/2013 0847   NA 136 12/18/2012 0945   K 4.0 09/07/2013 0847   K 4.2 12/18/2012 0945   CL 104 03/09/2013 0921   CL 100 12/18/2012 0945   CO2 28 09/07/2013 0847   CO2 26 12/18/2012 0945   BUN 16.2 09/07/2013 0847   BUN 17 12/18/2012 0945   CREATININE 0.9 09/07/2013 0847   CREATININE 0.84 12/18/2012 0945      Component Value Date/Time   CALCIUM 9.4 09/07/2013 0847   CALCIUM 9.1 12/18/2012 0945   ALKPHOS 113 09/07/2013 0847   ALKPHOS 70 06/09/2012 0956   AST 22 09/07/2013 0847   AST 20 06/09/2012 0956   ALT 37 09/07/2013 0847   ALT 29 06/09/2012 0956   BILITOT 0.38 09/07/2013 0847   BILITOT 0.3 06/09/2012 0956     Results for Gregory Schneider, Gregory Schneider (MRN [id number]) as of 10/10/2013 09:30  Ref. Range 03/09/2013 09:21 09/07/2013 08:47  PSA Latest Range: <=4.00 ng/mL 6.64 (H) 30.19 (H)    CT ABDOMEN AND PELVIS WITH CONTRAST  TECHNIQUE:  Multidetector CT imaging of the abdomen and pelvis was performed  using the standard protocol following bolus administration of  intravenous contrast.  CONTRAST: OMNIPAQUE IOHEXOL 300 MG/ML SOLN  COMPARISON: CT 06/12/2013.  FINDINGS:  Limited visualization of the lower thorax demonstrates no  consolidative or nodular pulmonary opacities. Normal heart size. No  pericardial effusion.  The liver is diffusely low in attenuation, compatible with hepatic  steatosis. Within the right hepatic lobe there is a 1.5 cm  low-attenuation region (image 33; series 2). Portal vein is patent.  Gallbladder is unremarkable. No intrahepatic or extrahepatic biliary  ductal dilatation. The spleen, pancreas and bilateral adrenal glands  are unremarkable. Kidneys enhance symmetrically with contrast. No  hydronephrosis.  Normal caliber abdominal aorta. No retroperitoneal lymphadenopathy.  Small fat containing  left inguinal hernia.  Re- demonstrated lobular contour of the prostate indenting the  trigone of the bladder. No pelvic lymphadenopathy is identified.  Descending and sigmoid colonic diverticulosis without evidence for  acute diverticulitis. No abnormal bowel wall thickening. No evidence  for bowel obstruction.  Worsening extensive osteoblastic metastatic disease. Interval  increase in size of sclerotic lesion within the left ischium  measuring 2.5 cm, previously 1.2 cm; 2.6 cm sclerotic lesion within  the mid aspect of the sacrum, previously 0.8 cm (image 69; series  2); 1.9 cm sclerotic focus within the L4 vertebral body, previously  1.1 cm; 3.2 cm sclerotic lesion within the T12 vertebral body,  previously 1.6 cm. There are additional sclerotic foci which have  increased in size from prior.  IMPRESSION:  1. Interval increase in size of multiple osteoblastic metastatic  lesions within the visualized thoracolumbar spine and pelvis.  2. Suggestion of a 1.5 cm low-attenuation lesion within the right  hepatic lobe, however this area is difficult to evaluate secondary  to extensive hepatic steatosis. Attention on followup.  3. No significant interval change in irregular enhancing tissue  impressing upon the base of the bladder. Local recurrence is not  entirely excluded.     Impression and Plan: This is a 57 year old gentleman with the following issues: 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 He is S/P Provenge. CT scan from 09/2013 showed that he is failing locally but also has increase in his bony metastasis. Given the rise in his PSA as well as the recent CT scan findings, I am in favor of starting him on systemic therapy at this time. Options of treatments were discussed today including Zytiga, Xtandi and systemic chemotherapy. Risks and benefits specifically of Zytiga were discussed, written  information were given today and he is willing to proceed with Zytiga in the near future. 2. Androgen deprivation.  He S/P Bilateral orchiectomy. 3. Bony disease. He'll continue Zometa monthly at [facility]. He will continue his calcium and vitamin D supplements. 4. Followup. In 4 weeks to assess complications from [facility].    Lichelle Viets 11/26/20149:44 AM

## 2013-10-15 ENCOUNTER — Encounter: Payer: Self-pay | Admitting: *Deleted

## 2013-10-15 NOTE — Progress Notes (Signed)
RECEIVED A TELEPHONE CALL FROM LEE WITH [facility]. PT.'S PRESCRIPTION WAS SENT TO [facility].

## 2013-10-16 ENCOUNTER — Telehealth: Payer: Self-pay | Admitting: *Deleted

## 2013-10-16 NOTE — Telephone Encounter (Signed)
Received notification from [facility] that pt's prescription has been transferred to [facility] since it is in the pt's employer network. Phone   [phone]   ;    Fax    [phone].

## 2013-10-17 ENCOUNTER — Encounter: Payer: Self-pay | Admitting: *Deleted

## 2013-10-19 NOTE — Progress Notes (Addendum)
FAXED RX FOR ZYTIGA WITH A NOTE THAT HE WILL NEED ASSISTANCE TO [facility] @ [phone]. PHONE # IS [phone].  10/22/13 [facility] HAD TO TRANSFER TO [facility] THEIR PHONE # IS [phone].

## 2013-10-29 ENCOUNTER — Encounter: Payer: Self-pay | Admitting: *Deleted

## 2013-10-29 NOTE — Progress Notes (Signed)
RECEIVED A FAX FROM [employer] CONCERNING A PRIOR AUTHORIZATION FOR ZYTIGA. THIS REQUEST WAS PLACED IN THE MANAGED CARE BIN.

## 2013-10-30 ENCOUNTER — Encounter: Payer: Self-pay | Admitting: Oncology

## 2013-10-30 NOTE — Progress Notes (Signed)
[employer] approved zytiga from 10/29/13-indefinitely.  Called [facility], [phone], they state they never received the fax.  Gregory Schneider sent original to [facility] 10/10/13 and [facility] states they sent it to [facility]. Refaxed prescription to [phone].

## 2013-11-09 ENCOUNTER — Telehealth: Payer: Self-pay | Admitting: *Deleted

## 2013-11-09 NOTE — Telephone Encounter (Signed)
Pt received Zytiga in mail today.  States instructions tell him to take with Prednisone and asks if he needs a rx for Prednisone?   Informed pt that if Dr. Clelia Croft did not order prednisone then he does not need it.  Instructed pt to start taking Zytiga as prescribed.  We will call him back if Dr. Clelia Croft does prescribe prednisone.   Pt verbalized understanding and will start Zytiga.

## 2013-11-13 ENCOUNTER — Telehealth: Payer: Self-pay | Admitting: *Deleted

## 2013-11-13 NOTE — Telephone Encounter (Signed)
Spoke with patient, confirmed that he does not need to take prednisone with zytiga, per dr Clelia Croft.

## 2013-11-21 ENCOUNTER — Ambulatory Visit (HOSPITAL_BASED_OUTPATIENT_CLINIC_OR_DEPARTMENT_OTHER): Payer: BC Managed Care – PPO | Admitting: Oncology

## 2013-11-21 ENCOUNTER — Telehealth: Payer: Self-pay | Admitting: Oncology

## 2013-11-21 ENCOUNTER — Other Ambulatory Visit (HOSPITAL_BASED_OUTPATIENT_CLINIC_OR_DEPARTMENT_OTHER): Payer: BC Managed Care – PPO

## 2013-11-21 VITALS — BP 168/97 | HR 69 | Temp 98.4°F | Resp 18 | Ht 75.0 in | Wt 342.1 lb

## 2013-11-21 DIAGNOSIS — C7952 Secondary malignant neoplasm of bone marrow: Secondary | ICD-10-CM

## 2013-11-21 DIAGNOSIS — C7951 Secondary malignant neoplasm of bone: Secondary | ICD-10-CM

## 2013-11-21 DIAGNOSIS — C61 Malignant neoplasm of prostate: Secondary | ICD-10-CM

## 2013-11-21 DIAGNOSIS — E291 Testicular hypofunction: Secondary | ICD-10-CM

## 2013-11-21 DIAGNOSIS — R972 Elevated prostate specific antigen [PSA]: Secondary | ICD-10-CM

## 2013-11-21 LAB — COMPREHENSIVE METABOLIC PANEL (CC13)
ALT: 27 U/L (ref 0–55)
AST: 26 U/L (ref 5–34)
Albumin: 4 g/dL (ref 3.5–5.0)
Alkaline Phosphatase: 266 U/L — ABNORMAL HIGH (ref 40–150)
Anion Gap: 11 mEq/L (ref 3–11)
BUN: 14.2 mg/dL (ref 7.0–26.0)
CO2: 26 mEq/L (ref 22–29)
Calcium: 8.5 mg/dL (ref 8.4–10.4)
Chloride: 105 mEq/L (ref 98–109)
Creatinine: 1 mg/dL (ref 0.7–1.3)
Glucose: 102 mg/dl (ref 70–140)
Potassium: 3.4 mEq/L — ABNORMAL LOW (ref 3.5–5.1)
Sodium: 142 mEq/L (ref 136–145)
Total Bilirubin: 0.56 mg/dL (ref 0.20–1.20)
Total Protein: 7.1 g/dL (ref 6.4–8.3)

## 2013-11-21 LAB — CBC WITH DIFFERENTIAL/PLATELET
BASO%: 1 % (ref 0.0–2.0)
Basophils Absolute: 0.1 10*3/uL (ref 0.0–0.1)
EOS%: 3.5 % (ref 0.0–7.0)
Eosinophils Absolute: 0.3 10*3/uL (ref 0.0–0.5)
HCT: 35.3 % — ABNORMAL LOW (ref 38.4–49.9)
HGB: 12.1 g/dL — ABNORMAL LOW (ref 13.0–17.1)
LYMPH%: 14.2 % (ref 14.0–49.0)
MCH: 29.1 pg (ref 27.2–33.4)
MCHC: 34.4 g/dL (ref 32.0–36.0)
MCV: 84.5 fL (ref 79.3–98.0)
MONO#: 0.4 10*3/uL (ref 0.1–0.9)
MONO%: 6.1 % (ref 0.0–14.0)
NEUT#: 5.6 10*3/uL (ref 1.5–6.5)
NEUT%: 75.2 % — ABNORMAL HIGH (ref 39.0–75.0)
Platelets: 112 10*3/uL — ABNORMAL LOW (ref 140–400)
RBC: 4.17 10*6/uL — ABNORMAL LOW (ref 4.20–5.82)
RDW: 13.5 % (ref 11.0–14.6)
WBC: 7.4 10*3/uL (ref 4.0–10.3)
lymph#: 1 10*3/uL (ref 0.9–3.3)

## 2013-11-21 LAB — PSA: PSA: 7.06 ng/mL — ABNORMAL HIGH (ref <=4.00)

## 2013-11-21 NOTE — Progress Notes (Signed)
Hematology and Oncology Follow Up Visit  Gregory Schneider [id number] 1956/09/30 58 y.o. 11/21/2013 3:10 PM Gregory Schneider, MDBabaoff, Caryl Bis, MD   Principle Diagnosis: 58 year old with prostate cancer dating back to 2010, where he was found to have an elevated PSA up to 15 and subsequently underwent a biopsy which showed a Gleason score 4 + 5 equals 9 in the right and the left apex of his prostate, indicating a poorly-differentiated adenocarcinoma of the prostate. He had sclerotic lesions in T7 and L2 at the time of diagnosis. 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  Prior Therapy: Combined androgen deprivation with Lupron and Casodex. He had a good response and then developed a rise in the PSA with castrate level testosterone.  He is S/P Bilateral simple orchiectomy done on 12/25/2012.  Provenge started on 03/03/12. Therapy Ended in 03/31/2012.   Current therapy: Zytiga started in 10/2013.  He continues to be on Zometa given at  [facility].  Interim History:  Gregory Schneider returns today for routine follow up. Since his last visit he has been doing well. He denies any pain today. No chest pain, shortness of breath, dyspnea, abdominal pain, nausea, vomiting. He denies any hematuria. He has no headaches or dizziness. Appetite and weight are stable. His performance status is at baseline. No fevers or sweats. He reports significant improvement in his urinary symptoms. He has reported that his frequency urgency and hesitancy as well as painful defecation all have improved since starting Zytiga. He is not reporting any specific complications related to this medication. He is not reporting any nausea or vomiting. Has not reported any lower extremity edema.  Medications: I have reviewed the patient's current medications.  Current Outpatient Prescriptions  Medication Sig Dispense Refill  . abiraterone Acetate (ZYTIGA) 250 MG tablet Take 4 tablets (1,000 mg total) by mouth daily. Take on an empty stomach 1  hour before or 2 hours after a meal  120 tablet  0  . buPROPion (WELLBUTRIN SR) 150 MG 12 hr tablet Take 150 mg by mouth daily.      . calcium carbonate (OS-CAL) 600 MG TABS Take 600 mg by mouth daily.      . cholecalciferol (VITAMIN D) 1000 UNITS tablet Take 1,000 Units by mouth daily.      Marland Kitchen HYDROcodone-acetaminophen (NORCO/VICODIN) 5-325 MG per tablet Take 1-2 tablets by mouth every 6 (six) hours as needed for pain.  30 tablet  0  . Mirabegron (MYRBETRIQ PO) Take 2 mg by mouth daily.      . Multiple Vitamin (MULTIVITAMIN) tablet Take 1 tablet by mouth daily.      . sertraline (ZOLOFT) 100 MG tablet Take 100 mg by mouth every morning.       . tamsulosin (FLOMAX) 0.4 MG CAPS capsule Take 0.4 mg by mouth 2 (two) times daily.      . Zoledronic Acid (ZOMETA) 4 MG/100ML IVPB Inject 4 mg into the vein every 30 (thirty) days.       No current facility-administered medications for this visit.    Allergies:  Allergies  Allergen Reactions  . Penicillins Other (See Comments)    Unknown     Past Medical History, Surgical history, Social history, and Family History were reviewed and updated.  Review of Systems: Per HPI and otherwise negative.  Physical Exam: Blood pressure 168/97, pulse 69, temperature 98.4 F (36.9 C), temperature source Oral, resp. rate 18, height 6\' 3"  (1.905 m), weight 342 lb 1.6 oz (155.176 kg), SpO2 99.00%.  ECOG: 0 General appearance: alert, cooperative and no distress Head: Normocephalic, without obvious abnormality, atraumatic Neck: no adenopathy, no carotid bruit, no JVD, supple, symmetrical, trachea midline and thyroid not enlarged, symmetric, no tenderness/mass/nodules Lymph nodes: Cervical, supraclavicular, and axillary nodes normal. Heart:regular rate and rhythm, S1, S2 normal, no murmur, click, rub or gallop Lung:chest clear, no wheezing, rales, normal symmetric air entry, no tachypnea, retractions or cyanosis Phoresis cathter in place. No redness or drainage  at insertion site. Abdomen: soft, non-tender, without masses or organomegaly EXT:no erythema, induration, or nodules   Lab Results: Lab Results  Component Value Date   WBC 7.4 11/21/2013   HGB 12.1* 11/21/2013   HCT 35.3* 11/21/2013   MCV 84.5 11/21/2013   PLT 112* 11/21/2013     Chemistry      Component Value Date/Time   NA 139 09/07/2013 0847   NA 136 12/18/2012 0945   K 4.0 09/07/2013 0847   K 4.2 12/18/2012 0945   CL 104 03/09/2013 0921   CL 100 12/18/2012 0945   CO2 28 09/07/2013 0847   CO2 26 12/18/2012 0945   BUN 16.2 09/07/2013 0847   BUN 17 12/18/2012 0945   CREATININE 0.9 09/07/2013 0847   CREATININE 0.84 12/18/2012 0945      Component Value Date/Time   CALCIUM 9.4 09/07/2013 0847   CALCIUM 9.1 12/18/2012 0945   ALKPHOS 113 09/07/2013 0847   ALKPHOS 70 06/09/2012 0956   AST 22 09/07/2013 0847   AST 20 06/09/2012 0956   ALT 37 09/07/2013 0847   ALT 29 06/09/2012 0956   BILITOT 0.38 09/07/2013 0847   BILITOT 0.3 06/09/2012 0956       Impression and Plan: This is a 58 year old gentleman with the following issues: 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 He is S/P Provenge. CT scan from 09/2013 showed that he is failing locally but also has increase in his bony metastasis. He started on Zytiga in the seminal 2014 and have tolerated it well. He is noticing clinical improvement after the start of Zytiga especially in his pelvic pain and urinary symptoms. His PSA is currently pending but the plan is to continue on this current medication for the time being. 2. Androgen deprivation.  He S/P Bilateral orchiectomy. 3. Bony disease. He'll continue Zometa monthly at [facility]. He will continue his calcium and vitamin D supplements. 4. Followup. In 4 to 5  weeks to assess complications from [facility].    MWNUUV,OZDGU 1/7/20153:10 PM

## 2013-11-21 NOTE — Telephone Encounter (Signed)
gv adn printed appt sched anda vs for pt for Feb °

## 2013-11-22 ENCOUNTER — Telehealth: Payer: Self-pay | Admitting: *Deleted

## 2013-11-22 NOTE — Telephone Encounter (Signed)
Message copied by Randolm Idol on Thu Nov 22, 2013  3:49 PM ------      Message from: Wyatt Portela      Created: Thu Nov 22, 2013 10:57 AM       Please call his PSA ------

## 2013-11-22 NOTE — Telephone Encounter (Signed)
Gave patient results of PSA done yesterday.

## 2013-11-22 NOTE — Telephone Encounter (Signed)
Message copied by Randolm Idol on Thu Nov 22, 2013  3:56 PM ------      Message from: Wyatt Portela      Created: Thu Nov 22, 2013 10:57 AM       Please call his PSA ------

## 2013-11-23 ENCOUNTER — Encounter: Payer: Self-pay | Admitting: Oncology

## 2013-11-23 ENCOUNTER — Other Ambulatory Visit: Payer: Self-pay | Admitting: *Deleted

## 2013-11-23 DIAGNOSIS — C61 Malignant neoplasm of prostate: Secondary | ICD-10-CM

## 2013-11-23 MED ORDER — ABIRATERONE ACETATE 250 MG PO TABS: 1000.0000 mg | ORAL_TABLET | Freq: Every day | ORAL | Status: DC

## 2013-11-23 NOTE — Telephone Encounter (Signed)
THIS REFILL REQUEST FOR ZYTIGA WAS PLACED IN DR.[name]'S ACTIVE WORK FOLDER.

## 2013-11-23 NOTE — Addendum Note (Signed)
Addended by: Wyonia Hough on: 11/23/2013 02:36 PM   Modules accepted: Orders

## 2013-11-27 ENCOUNTER — Telehealth: Payer: Self-pay | Admitting: *Deleted

## 2013-11-27 NOTE — Telephone Encounter (Signed)
Spoke with patient, per dr Alen Blew, patient should follow his pcp's directions re: xanax. Patient verbalized understanding.

## 2013-11-27 NOTE — Telephone Encounter (Signed)
Patient calling to request xanax. States his primary care physician did not want to start him on xanax. But, she did start him on a script for welbutrin 100 mg , along with his zoloft 150 mg daily. She Thinks he is depressed and he just thinks it's nerves because he seems to get agitated easily. He doesn't feel like he is depressed.

## 2013-12-11 ENCOUNTER — Other Ambulatory Visit: Payer: BC Managed Care – PPO | Admitting: Lab

## 2013-12-11 ENCOUNTER — Ambulatory Visit: Payer: BC Managed Care – PPO | Admitting: Oncology

## 2013-12-21 ENCOUNTER — Other Ambulatory Visit: Payer: Self-pay | Admitting: *Deleted

## 2013-12-21 DIAGNOSIS — C61 Malignant neoplasm of prostate: Secondary | ICD-10-CM

## 2013-12-21 MED ORDER — ABIRATERONE ACETATE 250 MG PO TABS: 1000.0000 mg | ORAL_TABLET | Freq: Every day | ORAL | Status: DC

## 2013-12-21 NOTE — Telephone Encounter (Signed)
THIS REFILL REQUEST FOR ZYTIGA WAS PLACED IN DR.[name]'S ACTIVE WORK FOLDER.

## 2014-01-01 ENCOUNTER — Ambulatory Visit (HOSPITAL_BASED_OUTPATIENT_CLINIC_OR_DEPARTMENT_OTHER): Payer: BC Managed Care – PPO | Admitting: Oncology

## 2014-01-01 ENCOUNTER — Encounter: Payer: Self-pay | Admitting: Oncology

## 2014-01-01 ENCOUNTER — Telehealth: Payer: Self-pay | Admitting: Oncology

## 2014-01-01 ENCOUNTER — Other Ambulatory Visit (HOSPITAL_BASED_OUTPATIENT_CLINIC_OR_DEPARTMENT_OTHER): Payer: BC Managed Care – PPO

## 2014-01-01 VITALS — BP 170/105 | HR 81 | Temp 97.3°F | Wt 327.4 lb

## 2014-01-01 DIAGNOSIS — C61 Malignant neoplasm of prostate: Secondary | ICD-10-CM

## 2014-01-01 DIAGNOSIS — E291 Testicular hypofunction: Secondary | ICD-10-CM

## 2014-01-01 DIAGNOSIS — C7951 Secondary malignant neoplasm of bone: Secondary | ICD-10-CM

## 2014-01-01 DIAGNOSIS — C7952 Secondary malignant neoplasm of bone marrow: Secondary | ICD-10-CM

## 2014-01-01 LAB — COMPREHENSIVE METABOLIC PANEL
ALT: 14 U/L (ref 0–53)
AST: 18 U/L (ref 0–37)
Albumin: 4.3 g/dL (ref 3.5–5.2)
Alkaline Phosphatase: 115 U/L (ref 39–117)
BUN: 10 mg/dL (ref 6–23)
CO2: 27 mEq/L (ref 19–32)
Calcium: 8.5 mg/dL (ref 8.4–10.5)
Chloride: 103 mEq/L (ref 96–112)
Creatinine, Ser: 0.83 mg/dL (ref 0.50–1.35)
Glucose, Bld: 106 mg/dL — ABNORMAL HIGH (ref 70–99)
Potassium: 3.2 mEq/L — ABNORMAL LOW (ref 3.5–5.3)
Sodium: 140 mEq/L (ref 135–145)
Total Bilirubin: 0.7 mg/dL (ref 0.2–1.2)
Total Protein: 6.8 g/dL (ref 6.0–8.3)

## 2014-01-01 LAB — CBC WITH DIFFERENTIAL/PLATELET
BASO%: 0.2 % (ref 0.0–2.0)
Basophils Absolute: 0 10*3/uL (ref 0.0–0.1)
EOS%: 5.4 % (ref 0.0–7.0)
Eosinophils Absolute: 0.3 10*3/uL (ref 0.0–0.5)
HCT: 34.4 % — ABNORMAL LOW (ref 38.4–49.9)
HGB: 11.8 g/dL — ABNORMAL LOW (ref 13.0–17.1)
LYMPH%: 20.8 % (ref 14.0–49.0)
MCH: 28.6 pg (ref 27.2–33.4)
MCHC: 34.3 g/dL (ref 32.0–36.0)
MCV: 83.3 fL (ref 79.3–98.0)
MONO#: 0.3 10*3/uL (ref 0.1–0.9)
MONO%: 5.8 % (ref 0.0–14.0)
NEUT#: 3.4 10*3/uL (ref 1.5–6.5)
NEUT%: 67.8 % (ref 39.0–75.0)
Platelets: 110 10*3/uL — ABNORMAL LOW (ref 140–400)
RBC: 4.13 10*6/uL — ABNORMAL LOW (ref 4.20–5.82)
RDW: 14.7 % — ABNORMAL HIGH (ref 11.0–14.6)
WBC: 5 10*3/uL (ref 4.0–10.3)
lymph#: 1 10*3/uL (ref 0.9–3.3)

## 2014-01-01 NOTE — Telephone Encounter (Signed)
gv pt appt schedule for mach. per pt no lb needed for 2/20.

## 2014-01-01 NOTE — Progress Notes (Signed)
Hematology and Oncology Follow Up Visit  Gregory Schneider [id number] Apr 02, 1956 58 y.o. 01/01/2014 10:33 AM BABAOFF, Caryl Bis, MDBabaoff, Caryl Bis, MD   Principle Diagnosis: 58 year old with prostate cancer dating back to 2010, where he was found to have an elevated PSA up to 15 and subsequently underwent a biopsy which showed a Gleason score 4 + 5 equals 9 in the right and the left apex of his prostate, indicating a poorly-differentiated adenocarcinoma of the prostate. He had sclerotic lesions in T7 and L2 at the time of diagnosis. 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  Prior Therapy: Combined androgen deprivation with Lupron and Casodex. He had a good response and then developed a rise in the PSA with castrate level testosterone.  He is S/P Bilateral simple orchiectomy done on 12/25/2012.  Provenge started on 03/03/12. Therapy Ended in 03/31/2012.   Current therapy: Zytiga started in 10/2013.  He continues to be on Zometa given at  [facility].  Interim History:  Gregory Schneider returns today for routine follow up. Since his last visit he has been doing well and continues to tolerate Zytiga without any complications. He denies any pain today. No chest pain, shortness of breath, dyspnea, abdominal pain, nausea, vomiting. He denies any hematuria. He has no headaches or dizziness. Appetite and weight are stable. His performance status is at baseline. No fevers or sweats. He reports significant improvement in his urinary symptoms. He has reported that his frequency urgency and hesitancy as well as painful defecation all have improved since starting Zytiga. He is not reporting any specific complications related to this medication. He is not reporting any nausea or vomiting. Has not reported any lower extremity edema. He continues to be active but does have grade 1 fatigue.  Medications: I have reviewed the patient's current medications.  Current Outpatient Prescriptions  Medication Sig Dispense Refill  .  abiraterone Acetate (ZYTIGA) 250 MG tablet Take 4 tablets (1,000 mg total) by mouth daily. Take on an empty stomach 1 hour before or 2 hours after a meal  120 tablet  0  . buPROPion (WELLBUTRIN SR) 150 MG 12 hr tablet Take 150 mg by mouth daily.      . calcium carbonate (OS-CAL) 600 MG TABS Take 600 mg by mouth daily.      . cholecalciferol (VITAMIN D) 1000 UNITS tablet Take 1,000 Units by mouth daily.      . clonazePAM (KLONOPIN) 1 MG tablet Take 1 mg by mouth as needed for anxiety (1-2 tablets daily as needed).       Marland Kitchen HYDROcodone-acetaminophen (NORCO/VICODIN) 5-325 MG per tablet Take 1-2 tablets by mouth every 6 (six) hours as needed for pain.  30 tablet  0  . Mirabegron (MYRBETRIQ PO) Take 2 mg by mouth daily.      . Multiple Vitamin (MULTIVITAMIN) tablet Take 1 tablet by mouth daily.      . sertraline (ZOLOFT) 100 MG tablet Take 100 mg by mouth every morning.       . Zoledronic Acid (ZOMETA) 4 MG/100ML IVPB Inject 4 mg into the vein every 30 (thirty) days.       No current facility-administered medications for this visit.    Allergies:  Allergies  Allergen Reactions  . Penicillins Other (See Comments)    Unknown     Past Medical History, Surgical history, Social history, and Family History were reviewed and updated.  Review of Systems: Per HPI and otherwise negative.  Physical Exam: Blood pressure 170/105, pulse 81, temperature  97.3 F (36.3 C), temperature source Oral, weight 327 lb 6 oz (148.496 kg). ECOG: 0 General appearance: alert, cooperative and no distress Head: Normocephalic, without obvious abnormality, atraumatic Neck: no adenopathy, no carotid bruit, no JVD, supple, symmetrical, trachea midline and thyroid not enlarged, symmetric, no tenderness/mass/nodules Lymph nodes: Cervical, supraclavicular, and axillary nodes normal. Heart:regular rate and rhythm, S1, S2 normal, no murmur, click, rub or gallop Lung:chest clear, no wheezing, rales, normal symmetric air entry,  no tachypnea, retractions or cyanosis Phoresis cathter in place. No redness or drainage at insertion site. Abdomen: soft, non-tender, without masses or organomegaly EXT:no erythema, induration, or nodules   Lab Results: Lab Results  Component Value Date   WBC 5.0 01/01/2014   HGB 11.8* 01/01/2014   HCT 34.4* 01/01/2014   MCV 83.3 01/01/2014   PLT 110* 01/01/2014     Chemistry      Component Value Date/Time   NA 142 11/21/2013 1445   NA 136 12/18/2012 0945   K 3.4* 11/21/2013 1445   K 4.2 12/18/2012 0945   CL 104 03/09/2013 0921   CL 100 12/18/2012 0945   CO2 26 11/21/2013 1445   CO2 26 12/18/2012 0945   BUN 14.2 11/21/2013 1445   BUN 17 12/18/2012 0945   CREATININE 1.0 11/21/2013 1445   CREATININE 0.84 12/18/2012 0945      Component Value Date/Time   CALCIUM 8.5 11/21/2013 1445   CALCIUM 9.1 12/18/2012 0945   ALKPHOS 266* 11/21/2013 1445   ALKPHOS 70 06/09/2012 0956   AST 26 11/21/2013 1445   AST 20 06/09/2012 0956   ALT 27 11/21/2013 1445   ALT 29 06/09/2012 0956   BILITOT 0.56 11/21/2013 1445   BILITOT 0.3 06/09/2012 0956     Results for Gregory Schneider, Gregory Schneider (MRN [id number]) as of 01/01/2014 10:25  Ref. Range 09/07/2013 08:47 11/21/2013 14:45  PSA Latest Range: <=4.00 ng/mL 30.19 (H) 7.06 (H)    Impression and Plan: This is a 58 year old gentleman with the following issues: 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 He is S/P Provenge. CT scan from 09/2013 showed that he is failing locally but also has increase in his bony metastasis. He started on Zytiga in 10/2013 and have tolerated it well. He is noticing clinical improvement after the start of Zytiga especially in his pelvic pain and urinary symptoms. His have dropped dramatically as well. The plan is to continue on the current treatment schedule. 2. Androgen deprivation.  He S/P Bilateral orchiectomy. 3. Bony disease. He'll continue Zometa monthly at [facility]. He  will continue his calcium and vitamin D supplements. 4. Followup. In 4 to 5  weeks to assess complications from [facility].    Gregory Schneider 2/17/201510:33 AM

## 2014-01-03 ENCOUNTER — Telehealth: Payer: Self-pay | Admitting: Medical Oncology

## 2014-01-03 LAB — PSA: PSA: 1.03 ng/mL (ref <=4.00)

## 2014-01-03 NOTE — Telephone Encounter (Signed)
Message copied by Maxwell Marion on Thu Jan 03, 2014  1:53 PM ------      Message from: Wyatt Portela      Created: Thu Jan 03, 2014  9:48 AM       Please call his PSA. Down again. ------

## 2014-01-03 NOTE — Telephone Encounter (Signed)
Informed patient PSA results down to 1.03. Patient expressed thanks, denies questions at this time.

## 2014-01-21 ENCOUNTER — Other Ambulatory Visit: Payer: Self-pay | Admitting: *Deleted

## 2014-01-21 DIAGNOSIS — C61 Malignant neoplasm of prostate: Secondary | ICD-10-CM

## 2014-01-21 MED ORDER — ABIRATERONE ACETATE 250 MG PO TABS: 1000.0000 mg | ORAL_TABLET | Freq: Every day | ORAL | Status: DC

## 2014-01-21 NOTE — Telephone Encounter (Signed)
Ok per dr Alen Blew to refill zytiga, e-scribed to [facility].

## 2014-02-01 ENCOUNTER — Telehealth: Payer: Self-pay | Admitting: Oncology

## 2014-02-01 ENCOUNTER — Encounter: Payer: Self-pay | Admitting: Oncology

## 2014-02-01 ENCOUNTER — Ambulatory Visit (HOSPITAL_BASED_OUTPATIENT_CLINIC_OR_DEPARTMENT_OTHER): Payer: BC Managed Care – PPO | Admitting: Oncology

## 2014-02-01 ENCOUNTER — Other Ambulatory Visit (HOSPITAL_BASED_OUTPATIENT_CLINIC_OR_DEPARTMENT_OTHER): Payer: BC Managed Care – PPO

## 2014-02-01 VITALS — BP 166/103 | HR 74 | Temp 97.7°F | Resp 19 | Ht 75.0 in | Wt 327.0 lb

## 2014-02-01 DIAGNOSIS — F341 Dysthymic disorder: Secondary | ICD-10-CM

## 2014-02-01 DIAGNOSIS — C7951 Secondary malignant neoplasm of bone: Secondary | ICD-10-CM

## 2014-02-01 DIAGNOSIS — C61 Malignant neoplasm of prostate: Secondary | ICD-10-CM

## 2014-02-01 DIAGNOSIS — E291 Testicular hypofunction: Secondary | ICD-10-CM

## 2014-02-01 DIAGNOSIS — R030 Elevated blood-pressure reading, without diagnosis of hypertension: Secondary | ICD-10-CM

## 2014-02-01 DIAGNOSIS — C7952 Secondary malignant neoplasm of bone marrow: Secondary | ICD-10-CM

## 2014-02-01 LAB — COMPREHENSIVE METABOLIC PANEL (CC13)
ALT: 13 U/L (ref 0–55)
AST: 15 U/L (ref 5–34)
Albumin: 3.9 g/dL (ref 3.5–5.0)
Alkaline Phosphatase: 114 U/L (ref 40–150)
Anion Gap: 10 mEq/L (ref 3–11)
BUN: 15.5 mg/dL (ref 7.0–26.0)
CO2: 24 mEq/L (ref 22–29)
Calcium: 9.3 mg/dL (ref 8.4–10.4)
Chloride: 108 mEq/L (ref 98–109)
Creatinine: 0.9 mg/dL (ref 0.7–1.3)
Glucose: 104 mg/dl (ref 70–140)
Potassium: 3.9 mEq/L (ref 3.5–5.1)
Sodium: 142 mEq/L (ref 136–145)
Total Bilirubin: 0.5 mg/dL (ref 0.20–1.20)
Total Protein: 7 g/dL (ref 6.4–8.3)

## 2014-02-01 LAB — CBC WITH DIFFERENTIAL/PLATELET
BASO%: 0.6 % (ref 0.0–2.0)
Basophils Absolute: 0 10*3/uL (ref 0.0–0.1)
EOS%: 4.2 % (ref 0.0–7.0)
Eosinophils Absolute: 0.3 10*3/uL (ref 0.0–0.5)
HCT: 37 % — ABNORMAL LOW (ref 38.4–49.9)
HGB: 12.7 g/dL — ABNORMAL LOW (ref 13.0–17.1)
LYMPH%: 23.2 % (ref 14.0–49.0)
MCH: 28.3 pg (ref 27.2–33.4)
MCHC: 34.3 g/dL (ref 32.0–36.0)
MCV: 82.7 fL (ref 79.3–98.0)
MONO#: 0.4 10*3/uL (ref 0.1–0.9)
MONO%: 6.4 % (ref 0.0–14.0)
NEUT#: 4.4 10*3/uL (ref 1.5–6.5)
NEUT%: 65.6 % (ref 39.0–75.0)
Platelets: 117 10*3/uL — ABNORMAL LOW (ref 140–400)
RBC: 4.47 10*6/uL (ref 4.20–5.82)
RDW: 14.1 % (ref 11.0–14.6)
WBC: 6.7 10*3/uL (ref 4.0–10.3)
lymph#: 1.6 10*3/uL (ref 0.9–3.3)

## 2014-02-01 LAB — PSA: PSA: 0.76 ng/mL (ref <=4.00)

## 2014-02-01 NOTE — Progress Notes (Signed)
Hematology and Oncology Follow Up Visit  Gregory Schneider [id number] [birth year] 58 y.o. 02/01/2014 11:20 AM   Principle Diagnosis: 59 year old with prostate cancer dating back to 2010, where he was found to have an elevated PSA up to 15 and subsequently underwent a biopsy which showed a Gleason score 4 + 5 equals 9 in the right and the left apex of his prostate, indicating a poorly-differentiated adenocarcinoma of the prostate. He had sclerotic lesions in T7 and L2 at the time of diagnosis. 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  Prior Therapy: Combined androgen deprivation with Lupron and Casodex. He had a good response and then developed a rise in the PSA with castrate level testosterone.  He is S/P Bilateral simple orchiectomy done on 12/25/2012.  Provenge started on 03/03/12. Therapy Ended in 03/31/2012.   Current therapy: Zytiga started in 10/2013.  He continues to be on Zometa given at  [facility].  Interim History:  Gregory Schneider returns today for routine follow up. Since his last visit he has been doing well and continues to tolerate Zytiga without any complications. He denies any pain today. No chest pain, shortness of breath, dyspnea, abdominal pain, nausea, vomiting. He denies any hematuria. He has no headaches or dizziness. Appetite and weight are stable. His performance status is at baseline. No fevers or sweats. He reports significant improvement in his urinary symptoms. He has reported that his frequency urgency and hesitancy as well as painful defecation all have improved since starting Zytiga. He is not reporting any specific complications related to this medication. He is not reporting any nausea or vomiting. Has not reported any lower extremity edema. He continues to be active but does have grade 1 fatigue. He is being followed by his PCP for depression/anxiety. Zoloft was recently increased to 200 mg daily and he remains on Wellbutrin 150 qd. He reports improvement in his symptoms,  but has ongoing anxiety and anger. He has increased his Klonopin on his own to 2 mg in the am and 2 mg in the pm.  Medications: I have reviewed the patient's current medications.  Current Outpatient Prescriptions  Medication Sig Dispense Refill  . abiraterone Acetate (ZYTIGA) 250 MG tablet Take 4 tablets (1,000 mg total) by mouth daily. Take on an empty stomach 1 hour before or 2 hours after a meal  120 tablet  0  . buPROPion (WELLBUTRIN SR) 150 MG 12 hr tablet Take 150 mg by mouth daily.      . calcium carbonate (OS-CAL) 600 MG TABS Take 600 mg by mouth daily.      . cholecalciferol (VITAMIN D) 1000 UNITS tablet Take 1,000 Units by mouth daily.      . clonazePAM (KLONOPIN) 1 MG tablet Take 1 mg by mouth as needed for anxiety (1-2 tablets daily as needed).       Marland Kitchen HYDROcodone-acetaminophen (NORCO/VICODIN) 5-325 MG per tablet Take 1-2 tablets by mouth every 6 (six) hours as needed for pain.  30 tablet  0  . Mirabegron (MYRBETRIQ PO) Take 2 mg by mouth daily.      . Multiple Vitamin (MULTIVITAMIN) tablet Take 1 tablet by mouth daily.      . sertraline (ZOLOFT) 100 MG tablet Take 100 mg by mouth every morning.       . Zoledronic Acid (ZOMETA) 4 MG/100ML IVPB Inject 4 mg into the vein every 30 (thirty) days.       No current facility-administered medications for this visit.    Allergies:  Allergies  Allergen Reactions  . Penicillins Other (See Comments)    Unknown     Past Medical History, Surgical history, Social history, and Family History were reviewed and updated.  Review of Systems: Per HPI and otherwise negative.  Physical Exam: Blood pressure 166/103, pulse 74, temperature 97.7 F (36.5 C), temperature source Oral, resp. rate 19, height 6\' 3"  (1.905 m), weight 327 lb (148.326 kg). ECOG: 1 General appearance: alert, cooperative and no distress Head: Normocephalic, without obvious abnormality, atraumatic Neck: no adenopathy, no carotid bruit, no JVD, supple, symmetrical,  trachea midline and thyroid not enlarged, symmetric, no tenderness/mass/nodules Lymph nodes: Cervical, supraclavicular, and axillary nodes normal. Heart:regular rate and rhythm, S1, S2 normal, no murmur, click, rub or gallop Lung:chest clear, no wheezing, rales, normal symmetric air entry, no tachypnea, retractions or cyanosis Phoresis cathter in place. No redness or drainage at insertion site. Abdomen: soft, non-tender, without masses or organomegaly EXT:no erythema, induration, or nodules   Lab Results: Lab Results  Component Value Date   WBC 6.7 02/01/2014   HGB 12.7* 02/01/2014   HCT 37.0* 02/01/2014   MCV 82.7 02/01/2014   PLT 117* 02/01/2014     Chemistry      Component Value Date/Time   NA 142 02/01/2014 1017   NA 140 01/01/2014 1213   K 3.9 02/01/2014 1017   K 3.2* 01/01/2014 1213   CL 103 01/01/2014 1213   CL 104 03/09/2013 0921   CO2 24 02/01/2014 1017   CO2 27 01/01/2014 1213   BUN 15.5 02/01/2014 1017   BUN 10 01/01/2014 1213   CREATININE 0.9 02/01/2014 1017   CREATININE 0.83 01/01/2014 1213      Component Value Date/Time   CALCIUM 9.3 02/01/2014 1017   CALCIUM 8.5 01/01/2014 1213   ALKPHOS 114 02/01/2014 1017   ALKPHOS 115 01/01/2014 1213   AST 15 02/01/2014 1017   AST 18 01/01/2014 1213   ALT 13 02/01/2014 1017   ALT 14 01/01/2014 1213   BILITOT 0.50 02/01/2014 1017   BILITOT 0.7 01/01/2014 1213     Results for Gregory Schneider, Gregory Schneider (MRN [id number]) as of 02/01/2014 10:57  Ref. 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 He is S/P Provenge. CT scan from 09/2013 showed that he is failing locally but also has increase in his bony metastasis. He started on Zytiga  in 10/2013 and has tolerated it well. He is noticing clinical improvement after the start of Zytiga especially in his pelvic pain and urinary symptoms. His have dropped dramatically as well. The plan is to continue on the current treatment schedule. 2. Androgen deprivation.  He S/P Bilateral orchiectomy. 3. Bony disease. He'll continue Zometa monthly at [facility]. He will continue his calcium and vitamin D supplements. 4. Elevated BP. BP has been consistently elevated in our office. Recommend follow-up with PCP for further management. 5. Depression and Anxiety. Zoloft was recently increased and he remains on Wellbutrin. He is taking Klonopin 2 mg BID. He will continue to follow-up with PCP for management of these disorders.  6. Followup. In 4 to 5  weeks to assess complications from [facility].    Morton, KRISTIN 3/20/201511:20 AM

## 2014-02-01 NOTE — Patient Instructions (Signed)
Results for Gregory Schneider, Gregory Schneider (MRN [id number]) as of 02/01/2014 10:57  Ref. Range 01/09/2013 09:06 03/09/2013 09:21 09/07/2013 08:47 11/21/2013 14:45 01/01/2014 09:47  PSA Latest Range: <=4.00 ng/mL 5.20 (H) 6.64 (H) 30.19 (H) 7.06 (H) 1.03

## 2014-02-01 NOTE — Telephone Encounter (Signed)
gv adn printed aptp sched and avs for pt for April °

## 2014-02-15 ENCOUNTER — Other Ambulatory Visit: Payer: Self-pay | Admitting: Medical Oncology

## 2014-02-15 ENCOUNTER — Other Ambulatory Visit: Payer: Self-pay | Admitting: *Deleted

## 2014-02-15 DIAGNOSIS — C61 Malignant neoplasm of prostate: Secondary | ICD-10-CM

## 2014-02-15 MED ORDER — ABIRATERONE ACETATE 250 MG PO TABS: 1000.0000 mg | ORAL_TABLET | Freq: Every day | ORAL | Status: DC

## 2014-02-15 NOTE — Telephone Encounter (Signed)
THIS REFILL REQUEST FOR ZYTIGA WAS PLACED IN DR.[name]'S ACTIVE WORK FOLDER.

## 2014-02-15 NOTE — Telephone Encounter (Signed)
Prescription refill for Zytiga faxed to [facility].

## 2014-02-27 ENCOUNTER — Telehealth: Payer: Self-pay | Admitting: Oncology

## 2014-02-27 NOTE — Telephone Encounter (Signed)
pt called to r/s appt..done...pt aware of new d.t °

## 2014-03-13 ENCOUNTER — Ambulatory Visit: Payer: BC Managed Care – PPO | Admitting: Oncology

## 2014-03-13 ENCOUNTER — Other Ambulatory Visit: Payer: BC Managed Care – PPO

## 2014-03-21 ENCOUNTER — Other Ambulatory Visit: Payer: Self-pay | Admitting: *Deleted

## 2014-03-21 DIAGNOSIS — C61 Malignant neoplasm of prostate: Secondary | ICD-10-CM

## 2014-03-21 MED ORDER — ABIRATERONE ACETATE 250 MG PO TABS: 1000.0000 mg | ORAL_TABLET | Freq: Every day | ORAL | Status: DC

## 2014-03-28 ENCOUNTER — Encounter: Payer: Self-pay | Admitting: Oncology

## 2014-03-28 ENCOUNTER — Other Ambulatory Visit (HOSPITAL_BASED_OUTPATIENT_CLINIC_OR_DEPARTMENT_OTHER): Payer: BC Managed Care – PPO

## 2014-03-28 ENCOUNTER — Telehealth: Payer: Self-pay | Admitting: Oncology

## 2014-03-28 ENCOUNTER — Ambulatory Visit (HOSPITAL_BASED_OUTPATIENT_CLINIC_OR_DEPARTMENT_OTHER): Payer: BC Managed Care – PPO | Admitting: Oncology

## 2014-03-28 VITALS — BP 170/100 | HR 84 | Temp 98.5°F | Resp 20 | Ht 75.0 in | Wt 318.8 lb

## 2014-03-28 DIAGNOSIS — E291 Testicular hypofunction: Secondary | ICD-10-CM

## 2014-03-28 DIAGNOSIS — C7952 Secondary malignant neoplasm of bone marrow: Secondary | ICD-10-CM

## 2014-03-28 DIAGNOSIS — C7951 Secondary malignant neoplasm of bone: Secondary | ICD-10-CM

## 2014-03-28 DIAGNOSIS — F329 Major depressive disorder, single episode, unspecified: Secondary | ICD-10-CM

## 2014-03-28 DIAGNOSIS — C61 Malignant neoplasm of prostate: Secondary | ICD-10-CM

## 2014-03-28 DIAGNOSIS — F3289 Other specified depressive episodes: Secondary | ICD-10-CM

## 2014-03-28 DIAGNOSIS — I10 Essential (primary) hypertension: Secondary | ICD-10-CM

## 2014-03-28 LAB — CBC WITH DIFFERENTIAL/PLATELET
BASO%: 0.7 % (ref 0.0–2.0)
Basophils Absolute: 0 10*3/uL (ref 0.0–0.1)
EOS%: 2.9 % (ref 0.0–7.0)
Eosinophils Absolute: 0.2 10*3/uL (ref 0.0–0.5)
HCT: 39 % (ref 38.4–49.9)
HGB: 13.2 g/dL (ref 13.0–17.1)
LYMPH%: 21.2 % (ref 14.0–49.0)
MCH: 28 pg (ref 27.2–33.4)
MCHC: 33.8 g/dL (ref 32.0–36.0)
MCV: 83 fL (ref 79.3–98.0)
MONO#: 0.4 10*3/uL (ref 0.1–0.9)
MONO%: 5 % (ref 0.0–14.0)
NEUT#: 5.1 10*3/uL (ref 1.5–6.5)
NEUT%: 70.2 % (ref 39.0–75.0)
Platelets: 116 10*3/uL — ABNORMAL LOW (ref 140–400)
RBC: 4.7 10*6/uL (ref 4.20–5.82)
RDW: 15 % — ABNORMAL HIGH (ref 11.0–14.6)
WBC: 7.2 10*3/uL (ref 4.0–10.3)
lymph#: 1.5 10*3/uL (ref 0.9–3.3)

## 2014-03-28 LAB — COMPREHENSIVE METABOLIC PANEL (CC13)
ALT: 15 U/L (ref 0–55)
AST: 16 U/L (ref 5–34)
Albumin: 3.9 g/dL (ref 3.5–5.0)
Alkaline Phosphatase: 78 U/L (ref 40–150)
Anion Gap: 13 mEq/L — ABNORMAL HIGH (ref 3–11)
BUN: 16.2 mg/dL (ref 7.0–26.0)
CO2: 26 mEq/L (ref 22–29)
Calcium: 9.7 mg/dL (ref 8.4–10.4)
Chloride: 106 mEq/L (ref 98–109)
Creatinine: 0.9 mg/dL (ref 0.7–1.3)
Glucose: 98 mg/dl (ref 70–140)
Potassium: 3.7 mEq/L (ref 3.5–5.1)
Sodium: 145 mEq/L (ref 136–145)
Total Bilirubin: 0.71 mg/dL (ref 0.20–1.20)
Total Protein: 6.9 g/dL (ref 6.4–8.3)

## 2014-03-28 NOTE — Progress Notes (Signed)
Hematology and Oncology Follow Up Visit  Gregory Schneider [id number] 09/06/1956 58 y.o. 03/28/2014 2:36 PM   Principle Diagnosis: 58 year old with prostate cancer dating back to 2010, where he was found to have an elevated PSA up to 15 and subsequently underwent a biopsy which showed a Gleason score 4 + 5 = 9 in the right and the left apex of his prostate, indicating a poorly-differentiated adenocarcinoma of the prostate.  He had sclerotic lesions in T7 and L2 at the time of diagnosis. 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  Prior Therapy:  Combined androgen deprivation with Lupron and Casodex. He had a good response and then developed a rise in the PSA with castrate level testosterone.  He is S/P Bilateral simple orchiectomy done on 12/25/2012.  Provenge started on 03/03/12. Therapy Ended in 03/31/2012.   Current therapy:  Zytiga started in 10/2013.  He continues to be on Zometa given at  [facility].  Interim History:  [name] returns today for  follow up. Since his last visit, he continues to tolerate Zytiga without any complications. He denies any pain today. No chest pain, shortness of breath, dyspnea, abdominal pain, nausea, vomiting. He denies any hematuria. He has no headaches or dizziness. Appetite and weight are stable. His performance status is at baseline. No fevers or sweats. He reports significant improvement in his urinary symptoms. He has reported that his frequency urgency and hesitancy as well as painful defecation all have improved since starting Zytiga.  He is not reporting any specific complications related to this medication. He is not reporting any nausea or vomiting. Has not reported any lower extremity edema. Does have occasional incontinence but has been very minimal as of late. He does not report any headaches blurred vision double vision. Does not report any syncope. Is not reporting any chest pain palpitation orthopnea but has reported increase in his blood  pressure. Does not report any cough or hemoptysis. Does not report any arthralgias or myalgias. Does not report any epistaxis, petechiae or lymphadenopathy. His review of system is otherwise unremarkable.  Medications: Reviewed today and no changes. Current Outpatient Prescriptions  Medication Sig Dispense Refill  . abiraterone Acetate (ZYTIGA) 250 MG tablet Take 4 tablets (1,000 mg total) by mouth daily. Take on an empty stomach 1 hour before or 2 hours after a meal  120 tablet  0  . buPROPion (WELLBUTRIN SR) 150 MG 12 hr tablet Take 150 mg by mouth daily.      . calcium carbonate (OS-CAL) 600 MG TABS Take 600 mg by mouth daily.      . cholecalciferol (VITAMIN D) 1000 UNITS tablet Take 1,000 Units by mouth daily.      . clonazePAM (KLONOPIN) 1 MG tablet Take 1 mg by mouth as needed for anxiety (1-2 tablets daily as needed).       Marland Kitchen HYDROcodone-acetaminophen (NORCO/VICODIN) 5-325 MG per tablet Take 1-2 tablets by mouth every 6 (six) hours as needed for pain.  30 tablet  0  . Multiple Vitamin (MULTIVITAMIN) tablet Take 1 tablet by mouth daily.      . sertraline (ZOLOFT) 100 MG tablet Take 200 mg by mouth every morning.       . Zoledronic Acid (ZOMETA) 4 MG/100ML IVPB Inject 4 mg into the vein every 30 (thirty) days.       No current facility-administered medications for this visit.    Allergies:  Allergies  Allergen Reactions  . Penicillins Other (See Comments)    Unknown  His past medical history, social history reviewed and unchanged.  Physical Exam: Blood pressure 170/100, pulse 84, temperature 98.5 F (36.9 C), temperature source Oral, resp. rate 20, height 6\' 3"  (1.905 m), weight 318 lb 12.8 oz (144.607 kg), SpO2 100.00%. ECOG: 1 General appearance: alert, cooperative and no distress Head: Normocephalic, without obvious abnormality, atraumatic Neck: no adenopathy, no thyroid masses. Lymph nodes: Cervical, supraclavicular, and axillary nodes normal. Heart:regular rate and  rhythm, S1, S2 normal, no murmur, click, rub or gallop Lung:chest clear, no wheezing, rales, normal symmetric air entry, no tachypnea, retractions or cyanosis Phoresis cathter in place. No redness or drainage at insertion site. Abdomen: soft, non-tender, without masses or organomegaly EXT:no erythema, induration, or nodules   Lab Results: Lab Results  Component Value Date   WBC 7.2 03/28/2014   HGB 13.2 03/28/2014   HCT 39.0 03/28/2014   MCV 83.0 03/28/2014   PLT 116* 03/28/2014     Chemistry      Component Value Date/Time   NA 142 02/01/2014 1017   NA 140 01/01/2014 1213   K 3.9 02/01/2014 1017   K 3.2* 01/01/2014 1213   CL 103 01/01/2014 1213   CL 104 03/09/2013 0921   CO2 24 02/01/2014 1017   CO2 27 01/01/2014 1213   BUN 15.5 02/01/2014 1017   BUN 10 01/01/2014 1213   CREATININE 0.9 02/01/2014 1017   CREATININE 0.83 01/01/2014 1213      Component Value Date/Time   CALCIUM 9.3 02/01/2014 1017   CALCIUM 8.5 01/01/2014 1213   ALKPHOS 114 02/01/2014 1017   ALKPHOS 115 01/01/2014 1213   AST 15 02/01/2014 1017   AST 18 01/01/2014 1213   ALT 13 02/01/2014 1017   ALT 14 01/01/2014 1213   BILITOT 0.50 02/01/2014 1017   BILITOT 0.7 01/01/2014 1213      Results for Gregory, Schneider (MRN [id number]) as of 03/28/2014 14:41  Ref. Schneider 01/01/2014 09:47 02/01/2014 10:17  PSA Latest Schneider: <=4.00 ng/mL 1.03 0.76     Impression and Plan: This is a 58 year old gentleman with the following issues:  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  He has been on Zytiga since 10/2013 and has tolerated it well. He is noticing clinical improvement after the start of Zytiga especially in his pelvic pain and urinary symptoms. His PSA continues to respond favorably as well The plan is to continue on the current treatment schedule.  2. Androgen deprivation.  He S/P Bilateral orchiectomy.  3. Bony disease. He'll continue Zometa monthly  at [facility]. He will continue his calcium and vitamin D supplements.  4. Hypertension: His following up with primary care physician for medication adjustments.  5. Depression and Anxiety. Appears to be stable on today's visit.   6. Followup. In 8 weeks to assess complications from [facility].    Wyatt Portela MD 5/14/20152:36 PM

## 2014-03-28 NOTE — Telephone Encounter (Signed)
Gave pt appt for lab and ML for July 2015 °

## 2014-03-29 ENCOUNTER — Telehealth: Payer: Self-pay | Admitting: *Deleted

## 2014-03-29 LAB — PSA: PSA: 0.55 ng/mL (ref <=4.00)

## 2014-03-29 NOTE — Telephone Encounter (Signed)
Spoke with patient, per dr Alen Blew, gave latest PSA result.

## 2014-03-29 NOTE — Telephone Encounter (Signed)
Message copied by Randolm Idol on Fri Mar 29, 2014  2:07 PM ------      Message from: Wyatt Portela      Created: Fri Mar 29, 2014 12:57 PM       Please call his PSA. Down again. ------

## 2014-04-17 ENCOUNTER — Other Ambulatory Visit: Payer: Self-pay | Admitting: Medical Oncology

## 2014-04-17 DIAGNOSIS — C61 Malignant neoplasm of prostate: Secondary | ICD-10-CM

## 2014-04-17 MED ORDER — ABIRATERONE ACETATE 250 MG PO TABS: 1000.0000 mg | ORAL_TABLET | Freq: Every day | ORAL | Status: DC

## 2014-04-17 NOTE — Telephone Encounter (Signed)
Zytiga prescription refill faxed to [facility] @ [phone]

## 2014-05-16 ENCOUNTER — Other Ambulatory Visit: Payer: Self-pay | Admitting: *Deleted

## 2014-05-16 DIAGNOSIS — C61 Malignant neoplasm of prostate: Secondary | ICD-10-CM

## 2014-05-16 MED ORDER — ABIRATERONE ACETATE 250 MG PO TABS: 1000.0000 mg | ORAL_TABLET | Freq: Every day | ORAL | Status: DC

## 2014-05-16 NOTE — Telephone Encounter (Signed)
THIS REFILL REQUEST FOR ZYTIGA WAS PLACED IN DR.[name]'S ACTIVE WORK FOLDER.

## 2014-05-28 ENCOUNTER — Other Ambulatory Visit (HOSPITAL_BASED_OUTPATIENT_CLINIC_OR_DEPARTMENT_OTHER): Payer: BC Managed Care – PPO

## 2014-05-28 ENCOUNTER — Ambulatory Visit (HOSPITAL_BASED_OUTPATIENT_CLINIC_OR_DEPARTMENT_OTHER): Payer: BC Managed Care – PPO | Admitting: Nurse Practitioner

## 2014-05-28 ENCOUNTER — Telehealth: Payer: Self-pay | Admitting: Internal Medicine

## 2014-05-28 VITALS — BP 142/88 | HR 79 | Temp 97.7°F | Resp 18 | Ht 75.0 in | Wt 317.8 lb

## 2014-05-28 DIAGNOSIS — C61 Malignant neoplasm of prostate: Secondary | ICD-10-CM

## 2014-05-28 DIAGNOSIS — I10 Essential (primary) hypertension: Secondary | ICD-10-CM

## 2014-05-28 DIAGNOSIS — D696 Thrombocytopenia, unspecified: Secondary | ICD-10-CM

## 2014-05-28 DIAGNOSIS — R5381 Other malaise: Secondary | ICD-10-CM

## 2014-05-28 DIAGNOSIS — R5383 Other fatigue: Secondary | ICD-10-CM

## 2014-05-28 DIAGNOSIS — E876 Hypokalemia: Secondary | ICD-10-CM | POA: Insufficient documentation

## 2014-05-28 DIAGNOSIS — C7951 Secondary malignant neoplasm of bone: Secondary | ICD-10-CM

## 2014-05-28 DIAGNOSIS — C7952 Secondary malignant neoplasm of bone marrow: Secondary | ICD-10-CM

## 2014-05-28 LAB — COMPREHENSIVE METABOLIC PANEL (CC13)
ALT: 14 U/L (ref 0–55)
AST: 15 U/L (ref 5–34)
Albumin: 3.8 g/dL (ref 3.5–5.0)
Alkaline Phosphatase: 98 U/L (ref 40–150)
Anion Gap: 9 mEq/L (ref 3–11)
BUN: 15 mg/dL (ref 7.0–26.0)
CO2: 28 mEq/L (ref 22–29)
Calcium: 8.9 mg/dL (ref 8.4–10.4)
Chloride: 102 mEq/L (ref 98–109)
Creatinine: 0.8 mg/dL (ref 0.7–1.3)
Glucose: 107 mg/dl (ref 70–140)
Potassium: 3.3 mEq/L — ABNORMAL LOW (ref 3.5–5.1)
Sodium: 139 mEq/L (ref 136–145)
Total Bilirubin: 0.46 mg/dL (ref 0.20–1.20)
Total Protein: 7.2 g/dL (ref 6.4–8.3)

## 2014-05-28 LAB — CBC WITH DIFFERENTIAL/PLATELET
BASO%: 0.1 % (ref 0.0–2.0)
Basophils Absolute: 0 10*3/uL (ref 0.0–0.1)
EOS%: 4.2 % (ref 0.0–7.0)
Eosinophils Absolute: 0.3 10*3/uL (ref 0.0–0.5)
HCT: 38.5 % (ref 38.4–49.9)
HGB: 13.4 g/dL (ref 13.0–17.1)
LYMPH%: 22.9 % (ref 14.0–49.0)
MCH: 28.8 pg (ref 27.2–33.4)
MCHC: 34.8 g/dL (ref 32.0–36.0)
MCV: 82.6 fL (ref 79.3–98.0)
MONO#: 0.4 10*3/uL (ref 0.1–0.9)
MONO%: 5.9 % (ref 0.0–14.0)
NEUT#: 4.5 10*3/uL (ref 1.5–6.5)
NEUT%: 66.9 % (ref 39.0–75.0)
Platelets: 110 10*3/uL — ABNORMAL LOW (ref 140–400)
RBC: 4.66 10*6/uL (ref 4.20–5.82)
RDW: 14.7 % — ABNORMAL HIGH (ref 11.0–14.6)
WBC: 6.7 10*3/uL (ref 4.0–10.3)
lymph#: 1.5 10*3/uL (ref 0.9–3.3)

## 2014-05-28 MED ORDER — POTASSIUM CHLORIDE CRYS ER 20 MEQ PO TBCR: 20.0000 meq | EXTENDED_RELEASE_TABLET | Freq: Two times a day (BID) | ORAL | Status: DC

## 2014-05-28 NOTE — Telephone Encounter (Signed)
Pt confirmed labs/ov per 07/14 POF, gave pt AVS.....KJ °

## 2014-05-29 LAB — PSA: PSA: 0.34 ng/mL (ref <=4.00)

## 2014-05-30 ENCOUNTER — Encounter: Payer: Self-pay | Admitting: Nurse Practitioner

## 2014-05-30 DIAGNOSIS — R5383 Other fatigue: Secondary | ICD-10-CM | POA: Insufficient documentation

## 2014-05-30 DIAGNOSIS — D696 Thrombocytopenia, unspecified: Secondary | ICD-10-CM | POA: Insufficient documentation

## 2014-05-30 DIAGNOSIS — C61 Malignant neoplasm of prostate: Secondary | ICD-10-CM | POA: Insufficient documentation

## 2014-05-30 DIAGNOSIS — I10 Essential (primary) hypertension: Secondary | ICD-10-CM | POA: Insufficient documentation

## 2014-05-30 DIAGNOSIS — C7951 Secondary malignant neoplasm of bone: Secondary | ICD-10-CM

## 2014-05-30 NOTE — Assessment & Plan Note (Signed)
Patient continues to receive Zometa on a monthly basis [facility].  Patient states his last Zometa infusion was last week.

## 2014-05-30 NOTE — Progress Notes (Signed)
[place]   Chief Complaint  Patient presents with  . Follow-up    HPI: Gregory Schneider 58 y.o. male diagnosed with prostate cancer; with bone metastasis.  He is currently undergoing Zytiga chemotherapy; as well as Zometa on a monthly basis per [facility].  Gregory Schneider is here today for followup.  He continues to complain of mild fatigue; but otherwise feels fairly well.  He remains very active on a daily basis.  He denies any recent fevers or chills.  He states that he last received this is Zometa per [facility] just last week.  CURRENT THERAPY: Upcoming Treatment Dates - PROSTATE Sipuleucel-T q14d Days with orders from any treatment category:  No upcoming days in selected categories.   Past Medical History  Diagnosis Date  . Cancer   . Prostate cancer 03/03/2012  . BPH (benign prostatic hyperplasia)   . Arthritis   . Depression   . Pneumonia     hx of    Past Surgical History  Procedure Laterality Date  . No past surgeries    . Hydrocele excision Left 12/25/2012    Procedure: HYDROCELECTOMY ADULT;  Surgeon: Dutch Gray, MD;  Location: [facility];  Service: Urology;  Laterality: Left;  LEFT HYDROCELE REPAIR, BILATERAL SIMPLE ORCHIECTOMY   . Orchiectomy Bilateral 12/25/2012    Procedure: ORCHIECTOMY;  Surgeon: Dutch Gray, MD;  Location: [facility];  Service: Urology;  Laterality: Bilateral;    has Prostate cancer; BPH (benign prostatic hyperplasia); Hypokalemia; Bone metastases; Fatigue; Hypertension; and Thrombocytopenia, unspecified on his problem list.     is allergic to penicillins.    Medication List       This list is accurate as of: 05/28/14 11:59 PM.  Always use your most recent med list.               abiraterone Acetate 250 MG tablet  Commonly known as:  ZYTIGA  Take 4 tablets (1,000 mg total) by mouth daily. Take on an empty stomach 1 hour before or 2 hours after a meal     calcium carbonate 600 MG Tabs tablet  Commonly known as:   OS-CAL  Take 600 mg by mouth daily.     cholecalciferol 1000 UNITS tablet  Commonly known as:  VITAMIN D  Take 1,000 Units by mouth daily.     clonazePAM 1 MG tablet  Commonly known as:  KLONOPIN  Take 1 mg by mouth as needed for anxiety (1-2 tablets daily as needed).     HYDROcodone-acetaminophen 5-325 MG per tablet  Commonly known as:  NORCO/VICODIN  Take 1-2 tablets by mouth every 6 (six) hours as needed for pain.     multivitamin tablet  Take 1 tablet by mouth daily.     potassium chloride SA 20 MEQ tablet  Commonly known as:  K-DUR,KLOR-CON  Take 1 tablet (20 mEq total) by mouth 2 (two) times daily.     sertraline 100 MG tablet  Commonly known as:  ZOLOFT  Take 200 mg by mouth every morning.     WELLBUTRIN SR 150 MG 12 hr tablet  Generic drug:  buPROPion  Take 150 mg by mouth daily.     ZOMETA 4 MG/100ML IVPB  Generic drug:  Zoledronic Acid  Inject 4 mg into the vein every 30 (thirty) days.          PHYSICAL EXAMINATION  Filed Vitals:   05/28/14 1144  BP: 142/88  Pulse: 79  Temp: 97.7 F (36.5 C)  Resp: 18    GENERAL:alert, healthy, no distress, well nourished and well developed SKIN: skin color, texture, turgor are normal, no rashes or significant lesions HEAD: Normocephalic, No masses, lesions, tenderness or abnormalities EYES: PERRLA, Conjunctiva are pink and non-injected OROPHARYNX:lips, buccal mucosa, and tongue normal  NECK: supple, no adenopathy, no bruits, no JVD LYMPH:  no palpable lymphadenopathy BREAST:not examined LUNGS: negative findings:  normal respiratory rate and rhythm, lungs clear to auscultation HEART: regular rate & rhythm, no murmurs and no gallops ABDOMEN:abdomen soft, non-tender, obese, normal bowel sounds and no masses or organomegaly BACK: No CVA tenderness, Range of motion is normal EXTREMITIES:no edema, no clubbing, no cyanosis  NEURO: alert & oriented x 3 with fluent speech, gait normal  LABORATORY DATA:. CBC  Lab  Results  Component Value Date   WBC 6.7 05/28/2014   RBC 4.66 05/28/2014   HGB 13.4 05/28/2014   HCT 38.5 05/28/2014   PLT 110* 05/28/2014   MCV 82.6 05/28/2014   MCH 28.8 05/28/2014   MCHC 34.8 05/28/2014   RDW 14.7* 05/28/2014   LYMPHSABS 1.5 05/28/2014   MONOABS 0.4 05/28/2014   EOSABS 0.3 05/28/2014   BASOSABS 0.0 05/28/2014     CMET  Lab Results  Component Value Date   NA 139 05/28/2014   K 3.3* 05/28/2014   CL 103 01/01/2014   CO2 28 05/28/2014   GLUCOSE 107 05/28/2014   BUN 15.0 05/28/2014   CREATININE 0.8 05/28/2014   CALCIUM 8.9 05/28/2014   PROT 7.2 05/28/2014   ALBUMIN 3.8 05/28/2014   AST 15 05/28/2014   ALT 14 05/28/2014   ALKPHOS 98 05/28/2014   BILITOT 0.46 05/28/2014   GFRNONAA >90 12/18/2012   GFRAA >90 12/18/2012    RADIOGRAPHIC STUDIES:none   ASSESSMENT/PLAN:    Prostate cancer  Assessment & Plan Reviewed all the lab results with patient in detail.  PSA has decreased to 0.4.  Patient will continue with his Zytiga S. directed.  He has plans to return to the [place] for labs and follow up visit in approximately 2 months.   Hypokalemia  Assessment & Plan Potassium today was 3.3.  Patient was prescribed potassium 20 mEq daily.  Was also encouraged to increase his potassium-rich diet.   Bone metastases  Assessment & Plan Patient continues to receive Zometa on a monthly basis [facility].  Patient states his last Zometa infusion was last week.   Fatigue  Assessment & Plan Fatigue is most likely secondary to Zytiga.  Patient was encouraged to remain as active as possible.   Hypertension  Assessment & Plan Pressure was 148/88 today.  Patient states that his primary care provider did recently initiate HCTZ therapy for further management of his blood pressure.  Discussed with patient that the HCTZ medication may very well cause his potassium levels to decrease; and he will need to increase his potassium gastric diet.   Thrombocytopenia,  unspecified  Assessment & Plan Patient has had some chronic issues with thrombocytopenia.  Platelet count today was 110.  Patient denies any new or worrisome issues with bleeding or bruising.  Will continue to monitor   Patient stated understanding of all instructions; and was in agreement with this plan of care. The patient knows to call the clinic with any problems, questions or concerns.   Review/collaboration with Dr. Alen Blew regarding all aspects of patient's visit today.   Total time spent with patient was 25 minutes;  with greater than 75 percent of that time spent in face to  face counseling regarding his lab results, his blood pressure medication and management/follow up with his PCP;  and coordination of care and follow up.  Disclaimer: This note was dictated with voice recognition software. Similar sounding words can inadvertently be transcribed and may not be corrected upon review.   Drue Second, NP 05/30/2014

## 2014-05-30 NOTE — Assessment & Plan Note (Signed)
Pressure was 148/88 today.  Patient states that his primary care provider did recently initiate HCTZ therapy for further management of his blood pressure.  Discussed with patient that the HCTZ medication may very well cause his potassium levels to decrease; and he will need to increase his potassium gastric diet.

## 2014-05-30 NOTE — Assessment & Plan Note (Signed)
Patient has had some chronic issues with thrombocytopenia.  Platelet count today was 110.  Patient denies any new or worrisome issues with bleeding or bruising.  Will continue to monitor

## 2014-05-30 NOTE — Assessment & Plan Note (Signed)
Potassium today was 3.3.  Patient was prescribed potassium 20 mEq daily.  Was also encouraged to increase his potassium-rich diet.

## 2014-05-30 NOTE — Assessment & Plan Note (Signed)
Fatigue is most likely secondary to Zytiga.  Patient was encouraged to remain as active as possible.

## 2014-05-30 NOTE — Assessment & Plan Note (Signed)
Reviewed all the lab results with patient in detail.  PSA has decreased to 0.4.  Patient will continue with his Zytiga S. directed.  He has plans to return to the [place] for labs and follow up visit in approximately 2 months.

## 2014-06-14 ENCOUNTER — Other Ambulatory Visit: Payer: Self-pay | Admitting: *Deleted

## 2014-06-14 DIAGNOSIS — C61 Malignant neoplasm of prostate: Secondary | ICD-10-CM

## 2014-06-14 MED ORDER — ABIRATERONE ACETATE 250 MG PO TABS: 1000.0000 mg | ORAL_TABLET | Freq: Every day | ORAL | Status: DC

## 2014-06-14 NOTE — Addendum Note (Signed)
Addended by: Wyonia Hough on: 06/14/2014 02:05 PM   Modules accepted: Orders

## 2014-06-14 NOTE — Telephone Encounter (Signed)
THIS REFILL REQUEST FOR ZYTIGA WAS PLACED IN DR.[name]'S ACTIVE WORK FOLDER.

## 2014-07-08 ENCOUNTER — Other Ambulatory Visit: Payer: Self-pay | Admitting: *Deleted

## 2014-07-08 DIAGNOSIS — E876 Hypokalemia: Secondary | ICD-10-CM

## 2014-07-08 MED ORDER — POTASSIUM CHLORIDE CRYS ER 20 MEQ PO TBCR: 20.0000 meq | EXTENDED_RELEASE_TABLET | Freq: Every day | ORAL | Status: DC

## 2014-07-15 ENCOUNTER — Other Ambulatory Visit: Payer: Self-pay | Admitting: *Deleted

## 2014-07-15 DIAGNOSIS — C61 Malignant neoplasm of prostate: Secondary | ICD-10-CM

## 2014-07-15 NOTE — Telephone Encounter (Signed)
THIS REFILL REQUEST FOR ZYTIGA WAS PLACED IN DR.[name]'S ACTIVE WORK FOLDER.

## 2014-07-16 MED ORDER — ABIRATERONE ACETATE 250 MG PO TABS: 1000.0000 mg | ORAL_TABLET | Freq: Every day | ORAL | Status: DC

## 2014-07-16 NOTE — Addendum Note (Signed)
Addended by: Wyonia Hough on: 07/16/2014 10:32 AM   Modules accepted: Orders

## 2014-07-29 ENCOUNTER — Other Ambulatory Visit: Payer: BC Managed Care – PPO

## 2014-07-29 ENCOUNTER — Ambulatory Visit: Payer: BC Managed Care – PPO

## 2014-07-30 ENCOUNTER — Other Ambulatory Visit: Payer: Self-pay | Admitting: Oncology

## 2014-07-30 ENCOUNTER — Other Ambulatory Visit: Payer: BC Managed Care – PPO

## 2014-07-30 ENCOUNTER — Ambulatory Visit: Payer: BC Managed Care – PPO

## 2014-07-30 ENCOUNTER — Encounter: Payer: Self-pay | Admitting: Hematology

## 2014-07-30 NOTE — Progress Notes (Signed)
Treatment was cancelled for today.

## 2014-08-02 ENCOUNTER — Telehealth: Payer: Self-pay | Admitting: *Deleted

## 2014-08-02 ENCOUNTER — Ambulatory Visit (HOSPITAL_BASED_OUTPATIENT_CLINIC_OR_DEPARTMENT_OTHER): Payer: BC Managed Care – PPO | Admitting: Oncology

## 2014-08-02 ENCOUNTER — Other Ambulatory Visit (HOSPITAL_BASED_OUTPATIENT_CLINIC_OR_DEPARTMENT_OTHER): Payer: BC Managed Care – PPO

## 2014-08-02 ENCOUNTER — Telehealth: Payer: Self-pay | Admitting: Oncology

## 2014-08-02 ENCOUNTER — Encounter: Payer: Self-pay | Admitting: Oncology

## 2014-08-02 VITALS — BP 167/103 | HR 65 | Temp 98.0°F | Resp 22 | Ht 75.0 in | Wt 321.0 lb

## 2014-08-02 DIAGNOSIS — C61 Malignant neoplasm of prostate: Secondary | ICD-10-CM

## 2014-08-02 DIAGNOSIS — E291 Testicular hypofunction: Secondary | ICD-10-CM

## 2014-08-02 DIAGNOSIS — F341 Dysthymic disorder: Secondary | ICD-10-CM

## 2014-08-02 DIAGNOSIS — C7951 Secondary malignant neoplasm of bone: Secondary | ICD-10-CM

## 2014-08-02 DIAGNOSIS — I10 Essential (primary) hypertension: Secondary | ICD-10-CM

## 2014-08-02 DIAGNOSIS — C7952 Secondary malignant neoplasm of bone marrow: Secondary | ICD-10-CM

## 2014-08-02 LAB — CBC WITH DIFFERENTIAL/PLATELET
BASO%: 1.1 % (ref 0.0–2.0)
Basophils Absolute: 0.1 10*3/uL (ref 0.0–0.1)
EOS%: 2.6 % (ref 0.0–7.0)
Eosinophils Absolute: 0.2 10*3/uL (ref 0.0–0.5)
HCT: 37.1 % — ABNORMAL LOW (ref 38.4–49.9)
HGB: 12.7 g/dL — ABNORMAL LOW (ref 13.0–17.1)
LYMPH%: 23.2 % (ref 14.0–49.0)
MCH: 29.1 pg (ref 27.2–33.4)
MCHC: 34.3 g/dL (ref 32.0–36.0)
MCV: 84.6 fL (ref 79.3–98.0)
MONO#: 0.4 10*3/uL (ref 0.1–0.9)
MONO%: 5.3 % (ref 0.0–14.0)
NEUT#: 4.9 10*3/uL (ref 1.5–6.5)
NEUT%: 67.8 % (ref 39.0–75.0)
Platelets: 121 10*3/uL — ABNORMAL LOW (ref 140–400)
RBC: 4.39 10*6/uL (ref 4.20–5.82)
RDW: 14.2 % (ref 11.0–14.6)
WBC: 7.3 10*3/uL (ref 4.0–10.3)
lymph#: 1.7 10*3/uL (ref 0.9–3.3)

## 2014-08-02 LAB — COMPREHENSIVE METABOLIC PANEL (CC13)
ALT: 14 U/L (ref 0–55)
AST: 17 U/L (ref 5–34)
Albumin: 3.6 g/dL (ref 3.5–5.0)
Alkaline Phosphatase: 92 U/L (ref 40–150)
Anion Gap: 10 mEq/L (ref 3–11)
BUN: 14.1 mg/dL (ref 7.0–26.0)
CO2: 30 mEq/L — ABNORMAL HIGH (ref 22–29)
Calcium: 9.2 mg/dL (ref 8.4–10.4)
Chloride: 100 mEq/L (ref 98–109)
Creatinine: 0.8 mg/dL (ref 0.7–1.3)
Glucose: 110 mg/dl (ref 70–140)
Potassium: 2.8 mEq/L — CL (ref 3.5–5.1)
Sodium: 140 mEq/L (ref 136–145)
Total Bilirubin: 0.58 mg/dL (ref 0.20–1.20)
Total Protein: 7 g/dL (ref 6.4–8.3)

## 2014-08-02 MED ORDER — HYDROCODONE-ACETAMINOPHEN 5-325 MG PO TABS: 1.0000 | ORAL_TABLET | Freq: Four times a day (QID) | ORAL | Status: DC | PRN

## 2014-08-02 NOTE — Progress Notes (Signed)
Hematology and Oncology Follow Up Visit  Gregory Schneider [id number] 12-May-1956 57 y.o. 08/02/2014 9:44 AM   Principle Diagnosis: 58 year old with prostate cancer dating back to 2010, where he was found to have an elevated PSA up to 15 and subsequently underwent a biopsy which showed a Gleason score 4 + 5 = 9 in the right and the left apex of his prostate, indicating a poorly-differentiated adenocarcinoma of the prostate.  He had sclerotic lesions in T7 and L2 at the time of diagnosis. 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  Prior Therapy:  Combined androgen deprivation with Lupron and Casodex. He had a good response and then developed a rise in the PSA with castrate level testosterone.  He is S/P Bilateral simple orchiectomy done on 12/25/2012.  Provenge started on 03/03/12. Therapy Ended in 03/31/2012.   Current therapy:  Zytiga started in 10/2013.  He continues to be on Zometa given at  [facility].  Interim History:  Gregory Schneider returns today for  follow up. Since his last visit, he continues to tolerate Zytiga without any complications. He has intermittent pain. Uses Ibuprofen or Hydrocodone sparingly. Requests a refill on his Hydrocodone today. No chest pain, shortness of breath, dyspnea, abdominal pain, nausea, vomiting. He denies any hematuria. He has no headaches or dizziness. Appetite and weight are stable. His performance status is at baseline. No fevers or sweats. He reports significant improvement in his urinary symptoms. He has reported that his frequency urgency and hesitancy as well as painful defecation all have improved since starting Zytiga. He is not reporting any specific complications related to this medication. He is not reporting any nausea or vomiting. Has not reported any lower extremity edema. Does have occasional incontinence but has been very minimal as of late. He does not report any headaches blurred vision double vision. Does not report any syncope. Is not reporting  any chest pain palpitation orthopnea but has reported increase in his blood pressure. States PCP started him on HCTZ recently. Unsure of the dose. Does not report any cough or hemoptysis. Does not report any arthralgias or myalgias. Does not report any epistaxis, petechiae or lymphadenopathy. His review of system is otherwise unremarkable.  Medications: Reviewed today and no changes. Current Outpatient Prescriptions  Medication Sig Dispense Refill  . abiraterone Acetate (ZYTIGA) 250 MG tablet Take 4 tablets (1,000 mg total) by mouth daily. Take on an empty stomach 1 hour before or 2 hours after a meal  120 tablet  0  . buPROPion (WELLBUTRIN SR) 150 MG 12 hr tablet Take 150 mg by mouth daily.      . calcium carbonate (OS-CAL) 600 MG TABS Take 600 mg by mouth daily.      . cholecalciferol (VITAMIN D) 1000 UNITS tablet Take 1,000 Units by mouth daily.      . clonazePAM (KLONOPIN) 1 MG tablet Take 1 mg by mouth as needed for anxiety (1-2 tablets daily as needed).       Marland Kitchen HYDROcodone-acetaminophen (NORCO/VICODIN) 5-325 MG per tablet Take 1-2 tablets by mouth every 6 (six) hours as needed.  30 tablet  0  . Multiple Vitamin (MULTIVITAMIN) tablet Take 1 tablet by mouth daily.      . potassium chloride SA (K-DUR,KLOR-CON) 20 MEQ tablet Take 1 tablet (20 mEq total) by mouth daily.  30 tablet  0  . sertraline (ZOLOFT) 100 MG tablet Take 200 mg by mouth every morning.       . Zoledronic Acid (ZOMETA) 4 MG/100ML IVPB Inject  4 mg into the vein every 30 (thirty) days.       No current facility-administered medications for this visit.    Allergies:  Allergies  Allergen Reactions  . Penicillins Other (See Comments)    Unknown     His past medical history, social history reviewed and unchanged.  Physical Exam: Blood pressure 167/103, pulse 65, temperature 98 F (36.7 C), temperature source Oral, resp. rate 22, height 6\' 3"  (1.905 m), weight 321 lb (145.605 kg), SpO2 100.00%. ECOG: 1 General  appearance: alert, cooperative and no distress Head: Normocephalic, without obvious abnormality, atraumatic Neck: no adenopathy, no thyroid masses. Lymph nodes: Cervical, supraclavicular, and axillary nodes normal. Heart:regular rate and rhythm, S1, S2 normal, no murmur, click, rub or gallop Lung:chest clear, no wheezing, rales, normal symmetric air entry, no tachypnea, retractions or cyanosis  Abdomen: soft, non-tender, without masses or organomegaly EXT:no erythema, induration, or nodules   Lab Results: Lab Results  Component Value Date   WBC 6.7 05/28/2014   HGB 13.4 05/28/2014   HCT 38.5 05/28/2014   MCV 82.6 05/28/2014   PLT 110* 05/28/2014     Chemistry      Component Value Date/Time   NA 139 05/28/2014 1039   NA 140 01/01/2014 1213   K 3.3* 05/28/2014 1039   K 3.2* 01/01/2014 1213   CL 103 01/01/2014 1213   CL 104 03/09/2013 0921   CO2 28 05/28/2014 1039   CO2 27 01/01/2014 1213   BUN 15.0 05/28/2014 1039   BUN 10 01/01/2014 1213   CREATININE 0.8 05/28/2014 1039   CREATININE 0.83 01/01/2014 1213      Component Value Date/Time   CALCIUM 8.9 05/28/2014 1039   CALCIUM 8.5 01/01/2014 1213   ALKPHOS 98 05/28/2014 1039   ALKPHOS 115 01/01/2014 1213   AST 15 05/28/2014 1039   AST 18 01/01/2014 1213   ALT 14 05/28/2014 1039   ALT 14 01/01/2014 1213   BILITOT 0.46 05/28/2014 1039   BILITOT 0.7 01/01/2014 1213     Results for Gregory Schneider, Gregory Schneider (MRN [id number]) as of 08/02/2014 09:16  Ref. Range 11/21/2013 14:45 01/01/2014 09:47 02/01/2014 10:17 03/28/2014 13:53 05/28/2014 10:39  PSA Latest Range: <=4.00 ng/mL 7.06 (H) 1.03 0.76 0.55 0.34    Impression and Plan: This is a 58 year old gentleman with the following issues:  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  He has been on Zytiga since 10/2013 and has tolerated it well. He is noticing clinical improvement after the start of Zytiga especially in his pelvic pain  and urinary symptoms. His PSA continues to respond favorably as well The plan is to continue on the current treatment schedule.  2. Androgen deprivation.  He S/P Bilateral orchiectomy.  3. Bony disease. He'll continue Zometa monthly at [facility]. He will continue his calcium and vitamin D supplements. Hydrocodone refilled today.   4. Hypertension: His following up with primary care physician for medication adjustments.  5. Depression and Anxiety. Appears to be stable on today's visit.   6. Followup. In 8 weeks to assess complications from [facility].    Mikey Bussing DNP, AGPCNP-BC 9/18/20159:44 AM

## 2014-08-02 NOTE — Telephone Encounter (Signed)
Message copied by Randolm Idol on Fri Aug 02, 2014  2:10 PM ------      Message from: Maryanna Shape      Created: Fri Aug 02, 2014  1:40 PM       Gregory Schneider            Please call [name] and let him know that his K+ is lower. He need to take his KDur 5 Meq twice a day. If needed, we can send new Rx. Please find out what pharmacy he uses.            Thanks ------

## 2014-08-02 NOTE — Telephone Encounter (Signed)
Spoke with patient, informed him that his potassium was low and he is to take 20 meq bid, per kristin curcio NP. Patient verbalized understanding

## 2014-08-02 NOTE — Telephone Encounter (Signed)
Pt confirmed labs/ov per 09/18 POF, gave pt AVS....KJ °

## 2014-08-03 LAB — PSA: PSA: 0.28 ng/mL (ref <=4.00)

## 2014-08-06 ENCOUNTER — Telehealth: Payer: Self-pay | Admitting: Medical Oncology

## 2014-08-06 NOTE — Telephone Encounter (Signed)
Patient called for PSA results and to inform office that he is taking his potassium prescription 20 meq BID.  PSA results given.

## 2014-08-12 ENCOUNTER — Other Ambulatory Visit: Payer: Self-pay | Admitting: *Deleted

## 2014-08-12 DIAGNOSIS — C61 Malignant neoplasm of prostate: Secondary | ICD-10-CM

## 2014-08-12 MED ORDER — ABIRATERONE ACETATE 250 MG PO TABS: 1000.0000 mg | ORAL_TABLET | Freq: Every day | ORAL | Status: DC

## 2014-08-12 NOTE — Addendum Note (Signed)
Addended by: Wyonia Hough on: 08/12/2014 11:57 AM   Modules accepted: Orders

## 2014-08-12 NOTE — Telephone Encounter (Signed)
THIS REFILL REQUEST FOR ZYTIGA WAS PLACED IN DR.[name]'S ACTIVE WORK FOLDER.

## 2014-08-23 ENCOUNTER — Other Ambulatory Visit: Payer: Self-pay | Admitting: *Deleted

## 2014-08-23 DIAGNOSIS — E876 Hypokalemia: Secondary | ICD-10-CM

## 2014-08-23 MED ORDER — POTASSIUM CHLORIDE CRYS ER 20 MEQ PO TBCR: 20.0000 meq | EXTENDED_RELEASE_TABLET | Freq: Two times a day (BID) | ORAL | Status: DC

## 2014-09-16 ENCOUNTER — Other Ambulatory Visit: Payer: Self-pay | Admitting: *Deleted

## 2014-09-16 NOTE — Telephone Encounter (Signed)
THIS REFILL REQUEST FOR ZYTIGA WAS PLACED IN DR.[name]'S ACTIVE WORK FOLDER.

## 2014-09-17 ENCOUNTER — Other Ambulatory Visit: Payer: Self-pay | Admitting: *Deleted

## 2014-09-17 DIAGNOSIS — C61 Malignant neoplasm of prostate: Secondary | ICD-10-CM

## 2014-09-17 MED ORDER — ABIRATERONE ACETATE 250 MG PO TABS: 1000.0000 mg | ORAL_TABLET | Freq: Every day | ORAL | Status: DC

## 2014-09-27 ENCOUNTER — Telehealth: Payer: Self-pay | Admitting: Oncology

## 2014-09-27 ENCOUNTER — Other Ambulatory Visit (HOSPITAL_BASED_OUTPATIENT_CLINIC_OR_DEPARTMENT_OTHER): Payer: BC Managed Care – PPO

## 2014-09-27 ENCOUNTER — Ambulatory Visit (HOSPITAL_BASED_OUTPATIENT_CLINIC_OR_DEPARTMENT_OTHER): Payer: BC Managed Care – PPO | Admitting: Oncology

## 2014-09-27 VITALS — BP 155/87 | HR 82 | Temp 98.4°F | Resp 19 | Ht 75.0 in | Wt 322.5 lb

## 2014-09-27 DIAGNOSIS — C7951 Secondary malignant neoplasm of bone: Secondary | ICD-10-CM

## 2014-09-27 DIAGNOSIS — C61 Malignant neoplasm of prostate: Secondary | ICD-10-CM

## 2014-09-27 DIAGNOSIS — I10 Essential (primary) hypertension: Secondary | ICD-10-CM

## 2014-09-27 LAB — COMPREHENSIVE METABOLIC PANEL (CC13)
ALT: 13 U/L (ref 0–55)
AST: 15 U/L (ref 5–34)
Albumin: 4 g/dL (ref 3.5–5.0)
Alkaline Phosphatase: 91 U/L (ref 40–150)
Anion Gap: 8 mEq/L (ref 3–11)
BUN: 12.2 mg/dL (ref 7.0–26.0)
CO2: 26 mEq/L (ref 22–29)
Calcium: 9.6 mg/dL (ref 8.4–10.4)
Chloride: 104 mEq/L (ref 98–109)
Creatinine: 0.9 mg/dL (ref 0.7–1.3)
Glucose: 123 mg/dl (ref 70–140)
Potassium: 3.1 mEq/L — ABNORMAL LOW (ref 3.5–5.1)
Sodium: 137 mEq/L (ref 136–145)
Total Bilirubin: 0.64 mg/dL (ref 0.20–1.20)
Total Protein: 7.3 g/dL (ref 6.4–8.3)

## 2014-09-27 LAB — CBC WITH DIFFERENTIAL/PLATELET
BASO%: 1 % (ref 0.0–2.0)
Basophils Absolute: 0.1 10*3/uL (ref 0.0–0.1)
EOS%: 3.3 % (ref 0.0–7.0)
Eosinophils Absolute: 0.3 10*3/uL (ref 0.0–0.5)
HCT: 39.9 % (ref 38.4–49.9)
HGB: 13.4 g/dL (ref 13.0–17.1)
LYMPH%: 24 % (ref 14.0–49.0)
MCH: 28.4 pg (ref 27.2–33.4)
MCHC: 33.7 g/dL (ref 32.0–36.0)
MCV: 84.4 fL (ref 79.3–98.0)
MONO#: 0.4 10*3/uL (ref 0.1–0.9)
MONO%: 4.6 % (ref 0.0–14.0)
NEUT#: 5.2 10*3/uL (ref 1.5–6.5)
NEUT%: 67.1 % (ref 39.0–75.0)
Platelets: 134 10*3/uL — ABNORMAL LOW (ref 140–400)
RBC: 4.73 10*6/uL (ref 4.20–5.82)
RDW: 14.2 % (ref 11.0–14.6)
WBC: 7.7 10*3/uL (ref 4.0–10.3)
lymph#: 1.9 10*3/uL (ref 0.9–3.3)

## 2014-09-27 MED ORDER — HYDROCODONE-ACETAMINOPHEN 5-325 MG PO TABS: 1.0000 | ORAL_TABLET | Freq: Four times a day (QID) | ORAL | Status: DC | PRN

## 2014-09-27 NOTE — Progress Notes (Signed)
Hematology and Oncology Follow Up Visit  Gregory Schneider [id number] Aug 23, 1956 58 y.o. 09/27/2014 2:35 PM   Principle Diagnosis: 58 year old with prostate cancer dating back to 2010, where he was found to have an elevated PSA up to 15 and subsequently underwent a biopsy which showed a Gleason score 4 + 5 = 9 in the right and the left apex of his prostate, indicating a poorly-differentiated adenocarcinoma of the prostate.  He had sclerotic lesions in T7 and L2 at the time of diagnosis. 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  Prior Therapy:  Combined androgen deprivation with Lupron and Casodex. He had a good response and then developed a rise in the PSA with castrate level testosterone.  He is S/P Bilateral simple orchiectomy done on 12/25/2012.  Provenge started on 03/03/12. Therapy Ended in 03/31/2012.   Current therapy:  Zytiga started in 10/2013.  He continues to be on Zometa given at  [facility].  Interim History:  Gregory Schneider returns today for a follow up visit. Since his last visit, he reports any new complications. He continues to tolerate Zytiga without any side effects. He does report occasional foot pain when he is on his feet for an extended period of time. Predominantly more on the right than the left. He reports no edema or trauma. he reports no pelvic pain or fullness. He urinates regularly.No chest pain, shortness of breath, dyspnea, abdominal pain, nausea, vomiting. He denies any hematuria. He has no headaches or dizziness. Appetite and weight are stable. His performance status is at baseline. No fevers or sweats. He reports significant improvement in his urinary symptoms. He has reported that his frequency urgency and hesitancy as well as painful defecation all have improved since starting Zytiga. He is not reporting any specific complications related to this medication. He is not reporting any nausea or vomiting. Has not reported any lower extremity edema. Does have occasional  incontinence but has been very minimal as of late. He does not report any headaches blurred vision double vision. Does not report any syncope. Is not reporting any chest pain palpitation orthopnea but has reported increase in his blood pressure. Does not report any cough or hemoptysis. Does not report any arthralgias or myalgias. Does not report any epistaxis, petechiae or lymphadenopathy. His review of system is otherwise unremarkable.  Medications: Reviewed today and no changes. Current Outpatient Prescriptions  Medication Sig Dispense Refill  . abiraterone Acetate (ZYTIGA) 250 MG tablet Take 4 tablets (1,000 mg total) by mouth daily. Take on an empty stomach 1 hour before or 2 hours after a meal 120 tablet 0  . buPROPion (WELLBUTRIN SR) 150 MG 12 hr tablet Take 150 mg by mouth daily.    . calcium carbonate (OS-CAL) 600 MG TABS Take 600 mg by mouth daily.    . calcium-vitamin D (CALCIUM 500/D) 500-200 MG-UNIT per tablet Take by mouth.    . cholecalciferol (VITAMIN D) 1000 UNITS tablet Take 1,000 Units by mouth daily.    . Cholecalciferol (VITAMIN D-1000 MAX ST) 1000 UNITS tablet Take by mouth.    . clonazePAM (KLONOPIN) 1 MG tablet Take 1 mg by mouth as needed for anxiety (1-2 tablets daily as needed).     . clonazePAM (KLONOPIN) 1 MG tablet Take by mouth.    . Cyanocobalamin (RA VITAMIN B-12 TR) 1000 MCG TBCR Take by mouth.    . hydrochlorothiazide (HYDRODIURIL) 25 MG tablet Take by mouth.    Marland Kitchen HYDROcodone-acetaminophen (NORCO/VICODIN) 5-325 MG per tablet Take 1-2 tablets  by mouth every 6 (six) hours as needed. 30 tablet 0  . Multiple Vitamin (MULTIVITAMIN) tablet Take 1 tablet by mouth daily.    . potassium chloride SA (K-DUR,KLOR-CON) 20 MEQ tablet Take 1 tablet (20 mEq total) by mouth 2 (two) times daily. 60 tablet 0  . sertraline (ZOLOFT) 100 MG tablet Take 200 mg by mouth every morning.     . Zoledronic Acid (ZOMETA) 4 MG/100ML IVPB Inject 4 mg into the vein every 30 (thirty) days.      No current facility-administered medications for this visit.    Allergies:  Allergies  Allergen Reactions  . Penicillins Other (See Comments)    Unknown     His past medical history, social history reviewed and unchanged.  Physical Exam: Blood pressure 155/87, pulse 82, temperature 98.4 F (36.9 C), temperature source Oral, resp. rate 19, height 6\' 3"  (1.905 m), weight 322 lb 8 oz (146.285 kg), SpO2 100 %. ECOG: 1 General appearance: alert, cooperative and no distress Head: Normocephalic, without obvious abnormality, atraumatic Neck: no adenopathy, no thyroid masses. Lymph nodes: Cervical, supraclavicular, and axillary nodes normal. Heart:regular rate and rhythm, S1, S2 normal, no murmur, click, rub or gallop Lung:chest clear, no wheezing, rales, normal symmetric air entry, no tachypnea, retractions or cyanosis  Abdomen: soft, non-tender, without masses or organomegaly EXT:no erythema, induration, or nodules.    Lab Results: Lab Results  Component Value Date   WBC 7.7 09/27/2014   HGB 13.4 09/27/2014   HCT 39.9 09/27/2014   MCV 84.4 09/27/2014   PLT 134* 09/27/2014     Chemistry      Component Value Date/Time   NA 140 08/02/2014 1045   NA 140 01/01/2014 1213   K 2.8 Repeated and Verified* 08/02/2014 1045   K 3.2* 01/01/2014 1213   CL 103 01/01/2014 1213   CL 104 03/09/2013 0921   CO2 30* 08/02/2014 1045   CO2 27 01/01/2014 1213   BUN 14.1 08/02/2014 1045   BUN 10 01/01/2014 1213   CREATININE 0.8 08/02/2014 1045   CREATININE 0.83 01/01/2014 1213      Component Value Date/Time   CALCIUM 9.2 08/02/2014 1045   CALCIUM 8.5 01/01/2014 1213   ALKPHOS 92 08/02/2014 1045   ALKPHOS 115 01/01/2014 1213   AST 17 08/02/2014 1045   AST 18 01/01/2014 1213   ALT 14 08/02/2014 1045   ALT 14 01/01/2014 1213   BILITOT 0.58 08/02/2014 1045   BILITOT 0.7 01/01/2014 1213      Results for ADARIAN, BUR (MRN [id number]) as of 09/27/2014 14:22  Ref. Range 03/28/2014 13:53  05/28/2014 10:39 08/02/2014 10:45  PSA Latest Range: <=4.00 ng/mL 0.55 0.34 0.28    Impression and Plan: This is a 58 year old gentleman with the following issues:  1. 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  He has been on Zytiga since 10/2013 and has tolerated it well. He is noticing clinical improvement after the start of Zytiga especially in his pelvic pain and urinary symptoms. He continues to have this benefit without any major side effects associated with this medication. His last PSA was down to 0.28. The plan is to continue on the current dose and schedule and evaluate him in 2 months.  2. Androgen deprivation.  He S/P Bilateral orchiectomy.  3. Bony disease. He continues Zometa monthly at [facility]. He will continue his calcium and vitamin D supplements. Hydrocodone refilled today.   4. Hypertension: His following up  with primary care physician for medication adjustments.  5. Depression and Anxiety. Appears to be stable on today's visit.   6. Followup. In 8 weeks to assess complications from [facility].    [facility] MD 11/13/20152:35 PM

## 2014-09-27 NOTE — Telephone Encounter (Signed)
gv adn printed appt sched and avs for pt for Jan 2016 °

## 2014-09-28 LAB — PSA: PSA: 0.24 ng/mL (ref <=4.00)

## 2014-11-18 ENCOUNTER — Other Ambulatory Visit: Payer: Self-pay | Admitting: *Deleted

## 2014-11-18 DIAGNOSIS — C61 Malignant neoplasm of prostate: Secondary | ICD-10-CM

## 2014-11-18 MED ORDER — ABIRATERONE ACETATE 250 MG PO TABS: 1000.0000 mg | ORAL_TABLET | Freq: Every day | ORAL | Status: DC

## 2014-11-18 NOTE — Telephone Encounter (Signed)
THIS REFILL REQUEST FOR ZYTIGA WAS PLACED IN DR.[name]'S ACTIVE WORK FOLDER.

## 2014-11-19 ENCOUNTER — Telehealth: Payer: Self-pay | Admitting: Oncology

## 2014-11-19 NOTE — Telephone Encounter (Signed)
returned call and s.w pt conf appts

## 2014-11-20 ENCOUNTER — Ambulatory Visit (HOSPITAL_BASED_OUTPATIENT_CLINIC_OR_DEPARTMENT_OTHER): Payer: BLUE CROSS/BLUE SHIELD | Admitting: Oncology

## 2014-11-20 ENCOUNTER — Other Ambulatory Visit: Payer: Self-pay | Admitting: *Deleted

## 2014-11-20 ENCOUNTER — Telehealth: Payer: Self-pay | Admitting: Oncology

## 2014-11-20 ENCOUNTER — Encounter: Payer: Self-pay | Admitting: *Deleted

## 2014-11-20 ENCOUNTER — Encounter: Payer: Self-pay | Admitting: Oncology

## 2014-11-20 ENCOUNTER — Other Ambulatory Visit (HOSPITAL_BASED_OUTPATIENT_CLINIC_OR_DEPARTMENT_OTHER): Payer: BLUE CROSS/BLUE SHIELD

## 2014-11-20 VITALS — BP 150/77 | HR 79 | Temp 98.2°F | Resp 19 | Ht 75.0 in | Wt 327.2 lb

## 2014-11-20 DIAGNOSIS — M25559 Pain in unspecified hip: Secondary | ICD-10-CM

## 2014-11-20 DIAGNOSIS — C61 Malignant neoplasm of prostate: Secondary | ICD-10-CM

## 2014-11-20 DIAGNOSIS — C7951 Secondary malignant neoplasm of bone: Secondary | ICD-10-CM

## 2014-11-20 DIAGNOSIS — M549 Dorsalgia, unspecified: Secondary | ICD-10-CM

## 2014-11-20 DIAGNOSIS — E291 Testicular hypofunction: Secondary | ICD-10-CM

## 2014-11-20 DIAGNOSIS — F418 Other specified anxiety disorders: Secondary | ICD-10-CM

## 2014-11-20 LAB — CBC WITH DIFFERENTIAL/PLATELET
BASO%: 1.2 % (ref 0.0–2.0)
Basophils Absolute: 0.1 10*3/uL (ref 0.0–0.1)
EOS%: 2.4 % (ref 0.0–7.0)
Eosinophils Absolute: 0.2 10*3/uL (ref 0.0–0.5)
HCT: 40.1 % (ref 38.4–49.9)
HGB: 13.5 g/dL (ref 13.0–17.1)
LYMPH%: 24.8 % (ref 14.0–49.0)
MCH: 28.8 pg (ref 27.2–33.4)
MCHC: 33.6 g/dL (ref 32.0–36.0)
MCV: 85.5 fL (ref 79.3–98.0)
MONO#: 0.4 10*3/uL (ref 0.1–0.9)
MONO%: 4.7 % (ref 0.0–14.0)
NEUT#: 5.7 10*3/uL (ref 1.5–6.5)
NEUT%: 66.9 % (ref 39.0–75.0)
Platelets: 150 10*3/uL (ref 140–400)
RBC: 4.69 10*6/uL (ref 4.20–5.82)
RDW: 14.6 % (ref 11.0–14.6)
WBC: 8.6 10*3/uL (ref 4.0–10.3)
lymph#: 2.1 10*3/uL (ref 0.9–3.3)

## 2014-11-20 LAB — COMPREHENSIVE METABOLIC PANEL (CC13)
ALT: 15 U/L (ref 0–55)
AST: 13 U/L (ref 5–34)
Albumin: 3.9 g/dL (ref 3.5–5.0)
Alkaline Phosphatase: 100 U/L (ref 40–150)
Anion Gap: 9 mEq/L (ref 3–11)
BUN: 21.4 mg/dL (ref 7.0–26.0)
CO2: 30 mEq/L — ABNORMAL HIGH (ref 22–29)
Calcium: 9.2 mg/dL (ref 8.4–10.4)
Chloride: 103 mEq/L (ref 98–109)
Creatinine: 0.9 mg/dL (ref 0.7–1.3)
EGFR: >90 mL/min/{1.73_m2} (ref >90)
Glucose: 110 mg/dl (ref 70–140)
Potassium: 3.7 mEq/L (ref 3.5–5.1)
Sodium: 141 mEq/L (ref 136–145)
Total Bilirubin: 0.39 mg/dL (ref 0.20–1.20)
Total Protein: 7.1 g/dL (ref 6.4–8.3)

## 2014-11-20 MED ORDER — HYDROCODONE-ACETAMINOPHEN 5-325 MG PO TABS: 1.0000 | ORAL_TABLET | Freq: Four times a day (QID) | ORAL | Status: DC | PRN

## 2014-11-20 NOTE — Progress Notes (Signed)
Mailed requested letter for disability to patient's home.

## 2014-11-20 NOTE — Telephone Encounter (Signed)
Gave avs & cal for March. °

## 2014-11-20 NOTE — Progress Notes (Signed)
Hematology and Oncology Follow Up Visit  Gregory Schneider [id number] [birth year] 59 y.o. 11/20/2014 10:54 AM   Principle Diagnosis: 59 year old with prostate cancer dating back to 2010, where he was found to have an elevated PSA up to 15 and subsequently underwent a biopsy which showed a Gleason score 4 + 5 = 9 in the right and the left apex of his prostate, indicating a poorly-differentiated adenocarcinoma of the prostate.  He had sclerotic lesions in T7 and L2 at the time of diagnosis. 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  Prior Therapy:  Combined androgen deprivation with Lupron and Casodex. He had a good response and then developed a rise in the PSA with castrate level testosterone.  He is S/P Bilateral simple orchiectomy done on 12/25/2012.  Provenge started on 03/03/12. Therapy Ended in 03/31/2012.   Current therapy:  Zytiga started in 10/2013.  He continues to be on Zometa given at  [facility].  Interim History:  [name] returns today for a follow up visit. Since his last visit, he is doing well. He continues to tolerate Zytiga without any side effects. He does report occasional hip pain and back pain which have not really changed dramatically. He continues to be active and able to ambulate certain distances but that aggravates his overall pain. He does not report any neurological deficits. Does not report any urine or bowel incontinence. He reports no edema or trauma. He urinates regularly without any issues. He reported that his pelvic discomfort have resolved since Zytiga over a year and a half ago. He reports no chest pain, shortness of breath, dyspnea, abdominal pain, nausea, vomiting. He denies any hematuria. He has no headaches or dizziness. Appetite and weight are stable. His performance status is at baseline. He is not reporting any specific complications related to this medication. He is not reporting any nausea or vomiting. Has not reported any lower extremity edema. He does  not report any headaches blurred vision double vision. Does not report any syncope. Is not reporting any chest pain palpitation orthopnea but has reported increase in his blood pressure. Does not report any cough or hemoptysis. Does not report any arthralgias or myalgias. Does not report any epistaxis, petechiae or lymphadenopathy. His review of system is otherwise unremarkable.  Medications: Reviewed today and no changes. Current Outpatient Prescriptions  Medication Sig Dispense Refill  . abiraterone Acetate (ZYTIGA) 250 MG tablet Take 4 tablets (1,000 mg total) by mouth daily. Take on an empty stomach 1 hour before or 2 hours after a meal 120 tablet 0  . buPROPion (WELLBUTRIN SR) 150 MG 12 hr tablet Take 150 mg by mouth daily.    . calcium carbonate (OS-CAL) 600 MG TABS Take 600 mg by mouth daily.    . calcium-vitamin D (CALCIUM 500/D) 500-200 MG-UNIT per tablet Take by mouth.    . cholecalciferol (VITAMIN D) 1000 UNITS tablet Take 1,000 Units by mouth daily.    . Cholecalciferol (VITAMIN D-1000 MAX ST) 1000 UNITS tablet Take by mouth.    . clonazePAM (KLONOPIN) 1 MG tablet Take 1 mg by mouth as needed for anxiety (1-2 tablets daily as needed).     . clonazePAM (KLONOPIN) 1 MG tablet Take by mouth.    . Cyanocobalamin (RA VITAMIN B-12 TR) 1000 MCG TBCR Take by mouth.    . hydrochlorothiazide (HYDRODIURIL) 25 MG tablet Take by mouth.    . Multiple Vitamin (MULTIVITAMIN) tablet Take 1 tablet by mouth daily.    . potassium chloride SA (  K-DUR,KLOR-CON) 20 MEQ tablet Take 1 tablet (20 mEq total) by mouth 2 (two) times daily. 60 tablet 0  . sertraline (ZOLOFT) 100 MG tablet Take 200 mg by mouth every morning.     . Zoledronic Acid (ZOMETA) 4 MG/100ML IVPB Inject 4 mg into the vein every 30 (thirty) days.    Marland Kitchen HYDROcodone-acetaminophen (NORCO/VICODIN) 5-325 MG per tablet Take 1-2 tablets by mouth every 6 (six) hours as needed. 60 tablet 0   No current facility-administered medications for this visit.     Allergies:  Allergies  Allergen Reactions  . Penicillins Other (See Comments)    Unknown     His past medical history, social history reviewed and unchanged.  Physical Exam: Blood pressure 150/77, pulse 79, temperature 98.2 F (36.8 C), resp. rate 19, height 6\' 3"  (1.905 m), weight 327 lb 3.2 oz (148.417 kg), SpO2 98 %. ECOG: 1 General appearance: alert, cooperative and no distress Head: Normocephalic, without obvious abnormality, atraumatic Neck: no adenopathy, no thyroid masses. Lymph nodes: Cervical, supraclavicular, and axillary nodes normal. Heart:regular rate and rhythm, S1, S2 normal, no murmur, click, rub or gallop Lung:chest clear, no wheezing, rales, normal symmetric air entry, no tachypnea, retractions or cyanosis  Abdomen: soft, non-tender, without masses or organomegaly EXT:no erythema, induration, or nodules.  Neurological examination: No deficits.  Lab Results: Lab Results  Component Value Date   WBC 8.6 11/20/2014   HGB 13.5 11/20/2014   HCT 40.1 11/20/2014   MCV 85.5 11/20/2014   PLT 150 11/20/2014     Chemistry      Component Value Date/Time   NA 137 09/27/2014 1356   NA 140 01/01/2014 1213   K 3.1* 09/27/2014 1356   K 3.2* 01/01/2014 1213   CL 103 01/01/2014 1213   CL 104 03/09/2013 0921   CO2 26 09/27/2014 1356   CO2 27 01/01/2014 1213   BUN 12.2 09/27/2014 1356   BUN 10 01/01/2014 1213   CREATININE 0.9 09/27/2014 1356   CREATININE 0.83 01/01/2014 1213      Component Value Date/Time   CALCIUM 9.6 09/27/2014 1356   CALCIUM 8.5 01/01/2014 1213   ALKPHOS 91 09/27/2014 1356   ALKPHOS 115 01/01/2014 1213   AST 15 09/27/2014 1356   AST 18 01/01/2014 1213   ALT 13 09/27/2014 1356   ALT 14 01/01/2014 1213   BILITOT 0.64 09/27/2014 1356   BILITOT 0.7 01/01/2014 1213      Results for Gregory, Schneider (MRN [id number]) as of 11/20/2014 10:30  Ref. Range 03/28/2014 13:53 05/28/2014 10:39 08/02/2014 10:45 09/27/2014 13:55  PSA Latest Range: <=4.00  ng/mL 0.55 0.34 0.28 0.24     Impression and Plan: This is a 59 year old gentleman with the following issues:  1. 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  He has been on Zytiga since 10/2013 and has tolerated it well. He noticed clinical improvement after the start of Zytiga especially in his pelvic pain and urinary symptoms. He continues to have this benefit without any major side effects associated with this medication. His last PSA was down to 0.24.   I have discussed repeating imaging studies for staging purposes especially because of his diffuse pain but he declined at this time. He think his pain has been arthritic in nature and have not dramatically changed. I counseled him about the possibility of poorly secreting PSA tumor that could be causing worsening cancer and worsening pain. He does not feel a whole lot  different at this time and would like to continue to defer any imaging studies of possible. We agreed to keep him on the current dose and schedule and follow him clinically and repeat imaging studies if his clinical status changes.  2. Androgen deprivation.  He S/P Bilateral orchiectomy.  3. Bony disease. He continues Zometa monthly at [facility]. He will continue his calcium and vitamin D supplements. Hydrocodone refilled today.   4. Hypertension: His following up with primary care physician.  5. Depression and Anxiety. Appears to be stable on today's visit.   6. Followup. In 8 weeks to assess complications from [facility].    [facility] MD 1/6/201610:54 AM

## 2014-11-21 ENCOUNTER — Other Ambulatory Visit: Payer: Self-pay | Admitting: *Deleted

## 2014-11-21 DIAGNOSIS — C61 Malignant neoplasm of prostate: Secondary | ICD-10-CM

## 2014-11-21 LAB — PSA: PSA: 0.25 ng/mL (ref <=4.00)

## 2014-11-21 MED ORDER — ABIRATERONE ACETATE 250 MG PO TABS: 1000.0000 mg | ORAL_TABLET | Freq: Every day | ORAL | Status: DC

## 2014-12-09 ENCOUNTER — Other Ambulatory Visit: Payer: Self-pay | Admitting: Oncology

## 2014-12-13 ENCOUNTER — Other Ambulatory Visit: Payer: Self-pay | Admitting: *Deleted

## 2014-12-13 DIAGNOSIS — C61 Malignant neoplasm of prostate: Secondary | ICD-10-CM

## 2014-12-13 MED ORDER — ABIRATERONE ACETATE 250 MG PO TABS: 1000.0000 mg | ORAL_TABLET | Freq: Every day | ORAL | Status: DC

## 2015-01-10 ENCOUNTER — Other Ambulatory Visit: Payer: Self-pay | Admitting: *Deleted

## 2015-01-10 DIAGNOSIS — C61 Malignant neoplasm of prostate: Secondary | ICD-10-CM

## 2015-01-10 MED ORDER — ABIRATERONE ACETATE 250 MG PO TABS: 1000.0000 mg | ORAL_TABLET | Freq: Every day | ORAL | Status: DC

## 2015-01-15 ENCOUNTER — Other Ambulatory Visit (HOSPITAL_BASED_OUTPATIENT_CLINIC_OR_DEPARTMENT_OTHER): Payer: BLUE CROSS/BLUE SHIELD

## 2015-01-15 ENCOUNTER — Telehealth: Payer: Self-pay | Admitting: Oncology

## 2015-01-15 ENCOUNTER — Ambulatory Visit (HOSPITAL_BASED_OUTPATIENT_CLINIC_OR_DEPARTMENT_OTHER): Payer: BLUE CROSS/BLUE SHIELD | Admitting: Oncology

## 2015-01-15 VITALS — BP 161/93 | HR 78 | Temp 98.1°F | Resp 19 | Ht 75.0 in | Wt 331.3 lb

## 2015-01-15 DIAGNOSIS — C61 Malignant neoplasm of prostate: Secondary | ICD-10-CM

## 2015-01-15 DIAGNOSIS — C7951 Secondary malignant neoplasm of bone: Secondary | ICD-10-CM

## 2015-01-15 LAB — CBC WITH DIFFERENTIAL/PLATELET
BASO%: 0.8 % (ref 0.0–2.0)
Basophils Absolute: 0.1 10*3/uL (ref 0.0–0.1)
EOS%: 3.9 % (ref 0.0–7.0)
Eosinophils Absolute: 0.3 10*3/uL (ref 0.0–0.5)
HCT: 41.2 % (ref 38.4–49.9)
HGB: 13.7 g/dL (ref 13.0–17.1)
LYMPH%: 25.5 % (ref 14.0–49.0)
MCH: 28.3 pg (ref 27.2–33.4)
MCHC: 33.3 g/dL (ref 32.0–36.0)
MCV: 84.8 fL (ref 79.3–98.0)
MONO#: 0.4 10*3/uL (ref 0.1–0.9)
MONO%: 5.8 % (ref 0.0–14.0)
NEUT#: 4.8 10*3/uL (ref 1.5–6.5)
NEUT%: 64 % (ref 39.0–75.0)
Platelets: 132 10*3/uL — ABNORMAL LOW (ref 140–400)
RBC: 4.86 10*6/uL (ref 4.20–5.82)
RDW: 14.5 % (ref 11.0–14.6)
WBC: 7.5 10*3/uL (ref 4.0–10.3)
lymph#: 1.9 10*3/uL (ref 0.9–3.3)

## 2015-01-15 LAB — COMPREHENSIVE METABOLIC PANEL (CC13)
ALT: 13 U/L (ref 0–55)
AST: 15 U/L (ref 5–34)
Albumin: 3.9 g/dL (ref 3.5–5.0)
Alkaline Phosphatase: 99 U/L (ref 40–150)
Anion Gap: 13 mEq/L — ABNORMAL HIGH (ref 3–11)
BUN: 17 mg/dL (ref 7.0–26.0)
CO2: 28 mEq/L (ref 22–29)
Calcium: 9.9 mg/dL (ref 8.4–10.4)
Chloride: 100 mEq/L (ref 98–109)
Creatinine: 0.9 mg/dL (ref 0.7–1.3)
EGFR: >90 mL/min/{1.73_m2} (ref >90)
Glucose: 122 mg/dl (ref 70–140)
Potassium: 4 mEq/L (ref 3.5–5.1)
Sodium: 141 mEq/L (ref 136–145)
Total Bilirubin: 0.66 mg/dL (ref 0.20–1.20)
Total Protein: 7.2 g/dL (ref 6.4–8.3)

## 2015-01-15 NOTE — Progress Notes (Signed)
Hematology and Oncology Follow Up Visit  Gregory Schneider [id number] May 05, 1956 59 y.o. 01/15/2015 10:41 AM   Principle Diagnosis: 59 year old with prostate cancer dating back to 2010, where he was found to have an elevated PSA up to 15 and subsequently underwent a biopsy which showed a Gleason score 4 + 5 = 9 in the right and the left apex of his prostate, indicating a poorly-differentiated adenocarcinoma of the prostate.  He had sclerotic lesions in T7 and L2 at the time of diagnosis. 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  Prior Therapy:  Combined androgen deprivation with Lupron and Casodex. He had a good response and then developed a rise in the PSA with castrate level testosterone.  He is S/P Bilateral simple orchiectomy done on 12/25/2012.  Provenge started on 03/03/12. Therapy Ended in 03/31/2012.   Current therapy:  Zytiga started in 10/2013.  He continues to be on Zometa given at  [facility].  Interim History:  Gregory Schneider returns today for a follow up visit. Since his last visit, he reports no new complications. He continues to tolerate Zytiga without any side effects.  He continues to be active and able to ambulate certain distances but still gains weight. He does not report any neurological deficits. Does not report any urine or bowel incontinence. He reports no edema or trauma. He urinates regularly without any issues.  He reports no chest pain, shortness of breath, dyspnea, abdominal pain, nausea, vomiting. He denies any hematuria. He has no headaches or dizziness. His performance status is at baseline. He is not reporting any specific complications related to this medication. He is not reporting any nausea or vomiting. Has not reported any lower extremity edema. He does not report any headaches blurred vision double vision. Does not report any syncope. Is not reporting any chest pain palpitation orthopnea but has reported increase in his blood pressure. Does not report any cough or  hemoptysis. Does not report any arthralgias or myalgias. Does not report any epistaxis, petechiae or lymphadenopathy. His review of system is otherwise unremarkable.  Medications: Reviewed today and no changes. Current Outpatient Prescriptions  Medication Sig Dispense Refill  . abiraterone Acetate (ZYTIGA) 250 MG tablet Take 4 tablets (1,000 mg total) by mouth daily. Take on an empty stomach 1 hour before or 2 hours after a meal 120 tablet 0  . buPROPion (WELLBUTRIN SR) 150 MG 12 hr tablet Take 150 mg by mouth daily.    . calcium carbonate (OS-CAL) 600 MG TABS Take 600 mg by mouth daily.    . calcium-vitamin D (CALCIUM 500/D) 500-200 MG-UNIT per tablet Take by mouth.    . cholecalciferol (VITAMIN D) 1000 UNITS tablet Take 1,000 Units by mouth daily.    . clonazePAM (KLONOPIN) 1 MG tablet Take 1 mg by mouth as needed for anxiety (1-2 tablets daily as needed).     . Cyanocobalamin (RA VITAMIN B-12 TR) 1000 MCG TBCR Take by mouth.    . hydrochlorothiazide (HYDRODIURIL) 25 MG tablet Take by mouth.    Marland Kitchen HYDROcodone-acetaminophen (NORCO/VICODIN) 5-325 MG per tablet Take 1-2 tablets by mouth every 6 (six) hours as needed. 60 tablet 0  . Multiple Vitamin (MULTIVITAMIN) tablet Take 1 tablet by mouth daily.    . potassium chloride SA (K-DUR,KLOR-CON) 20 MEQ tablet take 1 tablet by mouth twice a day 60 tablet 0  . sertraline (ZOLOFT) 100 MG tablet Take 200 mg by mouth every morning.     . Zoledronic Acid (ZOMETA) 4 MG/100ML IVPB Inject  4 mg into the vein every 30 (thirty) days.     No current facility-administered medications for this visit.    Allergies:  Allergies  Allergen Reactions  . Penicillins Other (See Comments)    Unknown     His past medical history, social history reviewed and unchanged.  Physical Exam: Blood pressure 161/93, pulse 78, temperature 98.1 F (36.7 C), temperature source Oral, resp. rate 19, height 6\' 3"  (1.905 m), weight 331 lb 4.8 oz (150.277 kg), SpO2 100 %. ECOG:  1 General appearance: alert, cooperative and no distress.  Head: Normocephalic, without obvious abnormality Neck: no adenopathy, no thyroid masses. Lymph nodes: Cervical, supraclavicular, and axillary nodes normal. Heart:regular rate and rhythm, S1, S2 normal, no murmur, click, rub or gallop Lung:chest clear, no wheezing, rales, normal symmetric air entry. Abdomen: soft, non-tender, without masses or organomegaly EXT:no erythema, induration, or nodules.  Neurological examination: No deficits.  Lab Results: Lab Results  Component Value Date   WBC 7.5 01/15/2015   HGB 13.7 01/15/2015   HCT 41.2 01/15/2015   MCV 84.8 01/15/2015   PLT 132* 01/15/2015     Chemistry      Component Value Date/Time   NA 141 11/20/2014 1017   NA 140 01/01/2014 1213   K 3.7 11/20/2014 1017   K 3.2* 01/01/2014 1213   CL 103 01/01/2014 1213   CL 104 03/09/2013 0921   CO2 30* 11/20/2014 1017   CO2 27 01/01/2014 1213   BUN 21.4 11/20/2014 1017   BUN 10 01/01/2014 1213   CREATININE 0.9 11/20/2014 1017   CREATININE 0.83 01/01/2014 1213      Component Value Date/Time   CALCIUM 9.2 11/20/2014 1017   CALCIUM 8.5 01/01/2014 1213   ALKPHOS 100 11/20/2014 1017   ALKPHOS 115 01/01/2014 1213   AST 13 11/20/2014 1017   AST 18 01/01/2014 1213   ALT 15 11/20/2014 1017   ALT 14 01/01/2014 1213   BILITOT 0.39 11/20/2014 1017   BILITOT 0.7 01/01/2014 1213      Results for Gregory Schneider (MRN [id number]) as of 01/15/2015 10:33  Ref. Range 05/28/2014 10:39 08/02/2014 10:45 09/27/2014 13:55 11/20/2014 10:17  PSA Latest Range: <=4.00 ng/mL 0.34 0.28 0.24 0.25      Impression and Plan: This is a 59 year old gentleman with the following issues:  1. 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  He has been on Zytiga since 10/2013 and has tolerated it well. He noticed clinical improvement after the start of Zytiga especially in his pelvic  pain and urinary symptoms. He continues to have this benefit without any major side effects associated with this medication. His last PSA was down to 0.25. The plan is to continue with the same dose and schedule for the time being.   2. Androgen deprivation.  He S/P Bilateral orchiectomy.  3. Bony disease. He continues Zometa monthly at [facility]. He will continue his calcium and vitamin D supplements.    4. Hypertension: His following up with primary care physician. This could be exacerbated by Zytiga.  5. Depression and Anxiety. His weight has been stable as of late.  6. Followup. In 8 weeks to assess complications from [facility].    [facility] MD 3/2/201610:41 AM

## 2015-01-15 NOTE — Telephone Encounter (Signed)
gv adn printed appt sched and avs for pt for May °

## 2015-01-16 ENCOUNTER — Telehealth: Payer: Self-pay | Admitting: *Deleted

## 2015-01-16 LAB — PSA: PSA: 0.21 ng/mL (ref <=4.00)

## 2015-01-16 NOTE — Telephone Encounter (Signed)
Returned patient's call with results of PSA

## 2015-01-20 ENCOUNTER — Encounter: Payer: Self-pay | Admitting: *Deleted

## 2015-01-20 NOTE — Progress Notes (Signed)
Faxed labs and last O.V. Note to dr Suzette Battiest [phone]

## 2015-01-20 NOTE — Progress Notes (Signed)
Please disregard note @ 1:02 pm. Last Progress note and labs sent to dr Derinda Late [phone]

## 2015-01-24 ENCOUNTER — Other Ambulatory Visit: Payer: Self-pay | Admitting: *Deleted

## 2015-01-24 DIAGNOSIS — C61 Malignant neoplasm of prostate: Secondary | ICD-10-CM

## 2015-01-24 DIAGNOSIS — C7951 Secondary malignant neoplasm of bone: Secondary | ICD-10-CM

## 2015-01-24 MED ORDER — HYDROCODONE-ACETAMINOPHEN 5-325 MG PO TABS: 1.0000 | ORAL_TABLET | Freq: Four times a day (QID) | ORAL | Status: DC | PRN

## 2015-01-24 NOTE — Telephone Encounter (Signed)
Spoke with patient informing him that his prescription for Hydrocodone was ready for pick up. Patient verbalized understanding.

## 2015-01-31 ENCOUNTER — Other Ambulatory Visit: Payer: Self-pay | Admitting: Oncology

## 2015-01-31 DIAGNOSIS — C600 Malignant neoplasm of prepuce: Secondary | ICD-10-CM

## 2015-02-19 ENCOUNTER — Telehealth: Payer: Self-pay | Admitting: *Deleted

## 2015-02-19 ENCOUNTER — Other Ambulatory Visit: Payer: Self-pay | Admitting: *Deleted

## 2015-02-19 DIAGNOSIS — C61 Malignant neoplasm of prostate: Secondary | ICD-10-CM

## 2015-02-19 MED ORDER — ABIRATERONE ACETATE 250 MG PO TABS: 1000.0000 mg | ORAL_TABLET | Freq: Every day | ORAL | Status: DC

## 2015-02-19 NOTE — Telephone Encounter (Signed)
[facility] faxed Zytiga refill request.  Request to provider's desk/in-basket for review.

## 2015-03-18 ENCOUNTER — Other Ambulatory Visit: Payer: Self-pay | Admitting: *Deleted

## 2015-03-18 DIAGNOSIS — C61 Malignant neoplasm of prostate: Secondary | ICD-10-CM

## 2015-03-18 MED ORDER — ABIRATERONE ACETATE 250 MG PO TABS: 1000.0000 mg | ORAL_TABLET | Freq: Every day | ORAL | Status: DC

## 2015-03-19 ENCOUNTER — Other Ambulatory Visit: Payer: BLUE CROSS/BLUE SHIELD

## 2015-03-19 ENCOUNTER — Ambulatory Visit: Payer: BLUE CROSS/BLUE SHIELD | Admitting: Oncology

## 2015-03-26 ENCOUNTER — Other Ambulatory Visit (HOSPITAL_BASED_OUTPATIENT_CLINIC_OR_DEPARTMENT_OTHER): Payer: BLUE CROSS/BLUE SHIELD

## 2015-03-26 ENCOUNTER — Telehealth: Payer: Self-pay | Admitting: Oncology

## 2015-03-26 ENCOUNTER — Ambulatory Visit (HOSPITAL_BASED_OUTPATIENT_CLINIC_OR_DEPARTMENT_OTHER): Payer: BLUE CROSS/BLUE SHIELD | Admitting: Oncology

## 2015-03-26 ENCOUNTER — Encounter: Payer: Self-pay | Admitting: *Deleted

## 2015-03-26 VITALS — BP 179/95 | HR 73 | Temp 98.1°F | Resp 18 | Ht 75.0 in | Wt 334.0 lb

## 2015-03-26 DIAGNOSIS — C7951 Secondary malignant neoplasm of bone: Secondary | ICD-10-CM

## 2015-03-26 DIAGNOSIS — C61 Malignant neoplasm of prostate: Secondary | ICD-10-CM

## 2015-03-26 LAB — COMPREHENSIVE METABOLIC PANEL (CC13)
ALT: 16 U/L (ref 0–55)
AST: 16 U/L (ref 5–34)
Albumin: 3.7 g/dL (ref 3.5–5.0)
Alkaline Phosphatase: 96 U/L (ref 40–150)
Anion Gap: 11 mEq/L (ref 3–11)
BUN: 15.3 mg/dL (ref 7.0–26.0)
CO2: 28 mEq/L (ref 22–29)
Calcium: 9.1 mg/dL (ref 8.4–10.4)
Chloride: 101 mEq/L (ref 98–109)
Creatinine: 0.8 mg/dL (ref 0.7–1.3)
EGFR: >90 mL/min/{1.73_m2} (ref >90)
Glucose: 126 mg/dl (ref 70–140)
Potassium: 3 mEq/L — CL (ref 3.5–5.1)
Sodium: 140 mEq/L (ref 136–145)
Total Bilirubin: 0.42 mg/dL (ref 0.20–1.20)
Total Protein: 6.7 g/dL (ref 6.4–8.3)

## 2015-03-26 LAB — CBC WITH DIFFERENTIAL/PLATELET
BASO%: 0.7 % (ref 0.0–2.0)
Basophils Absolute: 0.1 10*3/uL (ref 0.0–0.1)
EOS%: 3.1 % (ref 0.0–7.0)
Eosinophils Absolute: 0.3 10*3/uL (ref 0.0–0.5)
HCT: 37 % — ABNORMAL LOW (ref 38.4–49.9)
HGB: 12.9 g/dL — ABNORMAL LOW (ref 13.0–17.1)
LYMPH%: 20.1 % (ref 14.0–49.0)
MCH: 29.1 pg (ref 27.2–33.4)
MCHC: 34.7 g/dL (ref 32.0–36.0)
MCV: 83.7 fL (ref 79.3–98.0)
MONO#: 0.5 10*3/uL (ref 0.1–0.9)
MONO%: 5.4 % (ref 0.0–14.0)
NEUT#: 6 10*3/uL (ref 1.5–6.5)
NEUT%: 70.7 % (ref 39.0–75.0)
Platelets: 134 10*3/uL — ABNORMAL LOW (ref 140–400)
RBC: 4.42 10*6/uL (ref 4.20–5.82)
RDW: 14.6 % (ref 11.0–14.6)
WBC: 8.5 10*3/uL (ref 4.0–10.3)
lymph#: 1.7 10*3/uL (ref 0.9–3.3)

## 2015-03-26 MED ORDER — HYDROCODONE-ACETAMINOPHEN 5-325 MG PO TABS: 1.0000 | ORAL_TABLET | Freq: Four times a day (QID) | ORAL | Status: DC | PRN

## 2015-03-26 NOTE — Progress Notes (Signed)
Hematology and Oncology Follow Up Visit  Gregory Schneider [id number] 1955/12/18 59 y.o. 03/26/2015 9:06 AM   Principle Diagnosis: 59 year old with prostate cancer diagnosed in 2010. PSA was 15 and subsequently underwent a biopsy which showed a Gleason score 4 + 5 = 9 in the right and the left apex of his prostate, indicating a poorly-differentiated adenocarcinoma of the prostate. 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  Prior Therapy:  Combined androgen deprivation with Lupron and Casodex. He had a good response and then developed a rise in the PSA with castrate level testosterone.  He is S/P Bilateral simple orchiectomy done on 12/25/2012.  Provenge started on 03/03/12. Therapy Ended in 03/31/2012.   Current therapy:  Zytiga started in 10/2013.  He continues to be on Zometa given at  [facility].  Interim History:  Mr. Narramore returns today for a follow up visit. Since his last visit, he continues to do very well. He continues to tolerate Zytiga without any side effects.  He continues to be active and able to ambulate certain distances but still gains weight. His pressure has increased as well and currently on a new antihypertensive medication that was started recently. He does not report any neurological deficits. Does not report any urine or bowel incontinence. He reports no edema or trauma. He urinates regularly without any issues.  He reports no chest pain, shortness of breath, dyspnea, abdominal pain, nausea, vomiting. He denies any hematuria. He has no headaches or dizziness. His performance status is at baseline. He is not reporting any specific complications related to this medication. He is not reporting any nausea or vomiting. Has not reported any lower extremity edema. He does not report any headaches blurred vision double vision. Does not report any syncope. Is not reporting any chest pain palpitation orthopnea but has reported increase in his blood  pressure. Does not report any cough or hemoptysis. Does not report any arthralgias or myalgias. Does not report any epistaxis, petechiae or lymphadenopathy. His review of system is otherwise unremarkable.  Medications: Reviewed today and no changes. Current Outpatient Prescriptions  Medication Sig Dispense Refill  . abiraterone Acetate (ZYTIGA) 250 MG tablet Take 4 tablets (1,000 mg total) by mouth daily. Take on an empty stomach 1 hour before or 2 hours after a meal 120 tablet 0  . buPROPion (WELLBUTRIN SR) 150 MG 12 hr tablet Take 150 mg by mouth daily.    . calcium carbonate (OS-CAL) 600 MG TABS Take 600 mg by mouth daily.    . calcium-vitamin D (CALCIUM 500/D) 500-200 MG-UNIT per tablet Take by mouth.    . cholecalciferol (VITAMIN D) 1000 UNITS tablet Take 1,000 Units by mouth daily.    . clonazePAM (KLONOPIN) 1 MG tablet Take 1 mg by mouth as needed for anxiety (1-2 tablets daily as needed).     . Cyanocobalamin (RA VITAMIN B-12 TR) 1000 MCG TBCR Take by mouth.    . hydrochlorothiazide (HYDRODIURIL) 25 MG tablet Take by mouth.    Marland Kitchen HYDROcodone-acetaminophen (NORCO/VICODIN) 5-325 MG per tablet Take 1-2 tablets by mouth every 6 (six) hours as needed. 60 tablet 0  . losartan-hydrochlorothiazide (HYZAAR) 100-25 MG per tablet     . Multiple Vitamin (MULTIVITAMIN) tablet Take 1 tablet by mouth daily.    . potassium chloride SA (K-DUR,KLOR-CON) 20 MEQ tablet take 1 tablet by mouth twice a day 60 tablet 1  . sertraline (ZOLOFT) 100 MG tablet Take 200 mg by mouth every morning.     Marland Kitchen  Zoledronic Acid (ZOMETA) 4 MG/100ML IVPB Inject 4 mg into the vein every 30 (thirty) days.     No current facility-administered medications for this visit.    Allergies:  Allergies  Allergen Reactions  . Penicillins Other (See Comments)    Unknown     His past medical history, social history reviewed and unchanged.  Physical Exam: Blood pressure 179/95, pulse 73, temperature 98.1 F (36.7 C), temperature  source Oral, resp. rate 18, height 6\' 3"  (1.905 m), weight 334 lb (151.501 kg), SpO2 99 %. ECOG: 1 General appearance: alert and cooperative.  Obese gentleman. Head: Normocephalic, without obvious abnormality Neck: no adenopathy, no thyroid masses. Lymph nodes: Cervical, supraclavicular, and axillary nodes normal. Heart:regular rate and rhythm, S1, S2 normal, no murmur, click, rub or gallop Lung:chest clear, no wheezing, rales, normal symmetric air entry. Abdomen: soft, non-tender, without masses or organomegaly EXT:no erythema, induration, or nodules.  Neurological examination: No deficits.  Lab Results: Lab Results  Component Value Date   WBC 8.5 03/26/2015   HGB 12.9* 03/26/2015   HCT 37.0* 03/26/2015   MCV 83.7 03/26/2015   PLT 134* 03/26/2015     Chemistry      Component Value Date/Time   NA 141 01/15/2015 1007   NA 140 01/01/2014 1213   K 4.0 01/15/2015 1007   K 3.2* 01/01/2014 1213   CL 103 01/01/2014 1213   CL 104 03/09/2013 0921   CO2 28 01/15/2015 1007   CO2 27 01/01/2014 1213   BUN 17.0 01/15/2015 1007   BUN 10 01/01/2014 1213   CREATININE 0.9 01/15/2015 1007   CREATININE 0.83 01/01/2014 1213      Component Value Date/Time   CALCIUM 9.9 01/15/2015 1007   CALCIUM 8.5 01/01/2014 1213   ALKPHOS 99 01/15/2015 1007   ALKPHOS 115 01/01/2014 1213   AST 15 01/15/2015 1007   AST 18 01/01/2014 1213   ALT 13 01/15/2015 1007   ALT 14 01/01/2014 1213   BILITOT 0.66 01/15/2015 1007   BILITOT 0.7 01/01/2014 1213       Results for ANASTACIO, BUA (MRN [id number]) as of 03/26/2015 09:09  Ref. 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  He has been on Zytiga since 10/2013  and has tolerated it well. He noticed clinical improvement after the start of Zytiga especially in his pelvic pain and urinary symptoms. He continues to have this benefit without any major side effects associated with this medication. His last PSA was down to 0.21.   The plan is to continue with the same dose and schedule for the time being. We will use a different salvage regimen up on a relapse of his disease.   2. Androgen deprivation.  He S/P Bilateral orchiectomy.  3. Bony disease. He continues Zometa monthly at [facility]. He will continue his calcium and vitamin D supplements.    4. Hypertension: His following up with primary care physician. Losartan was started recently.  5. Back pain: Reasonably controlled with hydrocodone which she takes very infrequently. I have refilled his medication today.  6. Followup. In 8 weeks to assess complications from [facility].    [facility] MD 5/11/20169:06 AM

## 2015-03-26 NOTE — Telephone Encounter (Signed)
per pof to sch pt appt-gave tp copy of sch °

## 2015-03-26 NOTE — Progress Notes (Signed)
Lm on patient's answering machine. Potassium low, 3.0. He will need to notify his primary who recently put him on new BP medication. Gave examples of high potassium foods.

## 2015-03-27 LAB — PSA: PSA: 0.23 ng/mL (ref <=4.00)

## 2015-03-27 NOTE — Progress Notes (Signed)
Spoke with patient. Gave results of latest PSA. Encouraged potassium rich foods.

## 2015-04-01 ENCOUNTER — Telehealth: Payer: Self-pay | Admitting: Oncology

## 2015-04-01 NOTE — Telephone Encounter (Signed)
Called and spoke with patient and he is aware of his new appointment 7/20

## 2015-04-15 ENCOUNTER — Other Ambulatory Visit: Payer: Self-pay | Admitting: *Deleted

## 2015-04-15 DIAGNOSIS — C61 Malignant neoplasm of prostate: Secondary | ICD-10-CM

## 2015-04-15 MED ORDER — ABIRATERONE ACETATE 250 MG PO TABS: 1000.0000 mg | ORAL_TABLET | Freq: Every day | ORAL | Status: DC

## 2015-05-13 ENCOUNTER — Other Ambulatory Visit: Payer: Self-pay | Admitting: *Deleted

## 2015-05-13 DIAGNOSIS — C61 Malignant neoplasm of prostate: Secondary | ICD-10-CM

## 2015-05-13 MED ORDER — ABIRATERONE ACETATE 250 MG PO TABS
1000.0000 mg | ORAL_TABLET | Freq: Every day | ORAL | Status: DC
Start: 2015-05-13 — End: 2015-06-12

## 2015-05-26 ENCOUNTER — Telehealth: Payer: Self-pay | Admitting: Oncology

## 2015-05-26 NOTE — Telephone Encounter (Signed)
Pt called to r/s due to being out of town, pt confirmed labs/ov .Marland Kitchen... KJ

## 2015-05-27 ENCOUNTER — Other Ambulatory Visit: Payer: BLUE CROSS/BLUE SHIELD

## 2015-05-27 ENCOUNTER — Ambulatory Visit: Payer: BLUE CROSS/BLUE SHIELD | Admitting: Oncology

## 2015-06-04 ENCOUNTER — Other Ambulatory Visit: Payer: BLUE CROSS/BLUE SHIELD

## 2015-06-04 ENCOUNTER — Ambulatory Visit: Payer: BLUE CROSS/BLUE SHIELD | Admitting: Oncology

## 2015-06-12 ENCOUNTER — Other Ambulatory Visit: Payer: Self-pay | Admitting: *Deleted

## 2015-06-12 DIAGNOSIS — C61 Malignant neoplasm of prostate: Secondary | ICD-10-CM

## 2015-06-12 MED ORDER — ABIRATERONE ACETATE 250 MG PO TABS: 1000.0000 mg | ORAL_TABLET | Freq: Every day | ORAL | Status: DC

## 2015-06-18 ENCOUNTER — Other Ambulatory Visit (HOSPITAL_BASED_OUTPATIENT_CLINIC_OR_DEPARTMENT_OTHER): Payer: BLUE CROSS/BLUE SHIELD

## 2015-06-18 ENCOUNTER — Ambulatory Visit (HOSPITAL_BASED_OUTPATIENT_CLINIC_OR_DEPARTMENT_OTHER): Payer: BLUE CROSS/BLUE SHIELD | Admitting: Oncology

## 2015-06-18 ENCOUNTER — Telehealth: Payer: Self-pay | Admitting: Oncology

## 2015-06-18 VITALS — BP 154/91 | HR 93 | Temp 98.3°F | Resp 18 | Ht 75.0 in | Wt 324.4 lb

## 2015-06-18 DIAGNOSIS — I10 Essential (primary) hypertension: Secondary | ICD-10-CM | POA: Diagnosis not present

## 2015-06-18 DIAGNOSIS — C61 Malignant neoplasm of prostate: Secondary | ICD-10-CM | POA: Diagnosis not present

## 2015-06-18 DIAGNOSIS — E291 Testicular hypofunction: Secondary | ICD-10-CM | POA: Diagnosis not present

## 2015-06-18 DIAGNOSIS — C7951 Secondary malignant neoplasm of bone: Secondary | ICD-10-CM

## 2015-06-18 LAB — COMPREHENSIVE METABOLIC PANEL (CC13)
ALT: 19 U/L (ref 0–55)
AST: 18 U/L (ref 5–34)
Albumin: 4 g/dL (ref 3.5–5.0)
Alkaline Phosphatase: 86 U/L (ref 40–150)
Anion Gap: 8 mEq/L (ref 3–11)
BUN: 18.4 mg/dL (ref 7.0–26.0)
CO2: 32 mEq/L — ABNORMAL HIGH (ref 22–29)
Calcium: 9.5 mg/dL (ref 8.4–10.4)
Chloride: 102 mEq/L (ref 98–109)
Creatinine: 0.9 mg/dL (ref 0.7–1.3)
EGFR: >90 mL/min/{1.73_m2} (ref >90)
Glucose: 105 mg/dl (ref 70–140)
Potassium: 3.1 mEq/L — ABNORMAL LOW (ref 3.5–5.1)
Sodium: 143 mEq/L (ref 136–145)
Total Bilirubin: 0.87 mg/dL (ref 0.20–1.20)
Total Protein: 7.2 g/dL (ref 6.4–8.3)

## 2015-06-18 LAB — CBC WITH DIFFERENTIAL/PLATELET
BASO%: 0.1 % (ref 0.0–2.0)
Basophils Absolute: 0 10*3/uL (ref 0.0–0.1)
EOS%: 3.4 % (ref 0.0–7.0)
Eosinophils Absolute: 0.2 10*3/uL (ref 0.0–0.5)
HCT: 36.9 % — ABNORMAL LOW (ref 38.4–49.9)
HGB: 13.3 g/dL (ref 13.0–17.1)
LYMPH%: 23.6 % (ref 14.0–49.0)
MCH: 30.2 pg (ref 27.2–33.4)
MCHC: 36 g/dL (ref 32.0–36.0)
MCV: 83.7 fL (ref 79.3–98.0)
MONO#: 0.4 10*3/uL (ref 0.1–0.9)
MONO%: 5.6 % (ref 0.0–14.0)
NEUT#: 4.7 10*3/uL (ref 1.5–6.5)
NEUT%: 67.3 % (ref 39.0–75.0)
Platelets: 126 10*3/uL — ABNORMAL LOW (ref 140–400)
RBC: 4.41 10*6/uL (ref 4.20–5.82)
RDW: 14.6 % (ref 11.0–14.6)
WBC: 7 10*3/uL (ref 4.0–10.3)
lymph#: 1.7 10*3/uL (ref 0.9–3.3)

## 2015-06-18 MED ORDER — HYDROCODONE-ACETAMINOPHEN 5-325 MG PO TABS: 1.0000 | ORAL_TABLET | Freq: Four times a day (QID) | ORAL | Status: DC | PRN

## 2015-06-18 NOTE — Telephone Encounter (Signed)
Pt confirmed labs/ov per 08/03 POF, gave pt avs and calendar... KJ °

## 2015-06-18 NOTE — Progress Notes (Signed)
Hematology and Oncology Follow Up Visit  Gregory Schneider [id number] 1955/12/10 59 y.o. 06/18/2015 1:31 PM   Principle Diagnosis: 59 year old with prostate cancer diagnosed in 2010. PSA was 15 and subsequently underwent a biopsy which showed a Gleason score 4 + 5 = 9 in the right and the left apex of his prostate, indicating a poorly-differentiated adenocarcinoma of the prostate. 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  Prior Therapy:  Combined androgen deprivation with Lupron and Casodex. He had a good response and then developed a rise in the PSA with castrate level testosterone.  He is S/P Bilateral simple orchiectomy done on 12/25/2012.  Provenge started on 03/03/12. Therapy Ended in 03/31/2012.   Current therapy:  Zytiga started in 10/2013.  He continues to be on Zometa given at  [facility].  Interim History:  [name] returns today for a follow up visit. Since his last visit, he reports no new complaints. He continues to tolerate Zytiga without any side effects.  He continues to be active and able to ambulate certain distances but still gains weight.  He does report increased joint stiffness and interfere with mobility at times. He reports that his mobility is slower than normal. Does not report any urine or bowel incontinence. He reports no edema or trauma. He urinates regularly without any issues.   He reports no chest pain, shortness of breath, dyspnea, abdominal pain, nausea, vomiting. He denies any hematuria. He has no headaches or dizziness. His performance status is at baseline. He is not reporting any specific complications related to this medication. He is not reporting any nausea or vomiting. Has not reported any lower extremity edema. He does not report any headaches blurred vision double vision. Does not report any syncope. Is not reporting any chest pain palpitation orthopnea but has reported increase in his blood pressure. Does not report  any cough or hemoptysis. Does not report any arthralgias or myalgias. Does not report any epistaxis, petechiae or lymphadenopathy. His review of system is otherwise unremarkable.  Medications: Reviewed today and no changes. Current Outpatient Prescriptions  Medication Sig Dispense Refill  . abiraterone Acetate (ZYTIGA) 250 MG tablet Take 4 tablets (1,000 mg total) by mouth daily. Take on an empty stomach 1 hour before or 2 hours after a meal 120 tablet 0  . buPROPion (WELLBUTRIN SR) 150 MG 12 hr tablet Take 150 mg by mouth daily.    . calcium carbonate (OS-CAL) 600 MG TABS Take 600 mg by mouth daily.    . calcium-vitamin D (CALCIUM 500/D) 500-200 MG-UNIT per tablet Take by mouth.    . cholecalciferol (VITAMIN D) 1000 UNITS tablet Take 1,000 Units by mouth daily.    . clonazePAM (KLONOPIN) 1 MG tablet Take 1 mg by mouth as needed for anxiety (1-2 tablets daily as needed).     . Cyanocobalamin (RA VITAMIN B-12 TR) 1000 MCG TBCR Take by mouth.    Marland Kitchen HYDROcodone-acetaminophen (NORCO/VICODIN) 5-325 MG per tablet Take 1-2 tablets by mouth every 6 (six) hours as needed. 60 tablet 0  . losartan-hydrochlorothiazide (HYZAAR) 100-25 MG per tablet     . Multiple Vitamin (MULTIVITAMIN) tablet Take 1 tablet by mouth daily.    . sertraline (ZOLOFT) 100 MG tablet Take 200 mg by mouth every morning.     . Zoledronic Acid (ZOMETA) 4 MG/100ML IVPB Inject 4 mg into the vein every 30 (thirty) days.     No current facility-administered medications for this visit.  Allergies:  Allergies  Allergen Reactions  . Penicillins Other (See Comments)    Unknown     His past medical history, social history reviewed and unchanged.  Physical Exam: Blood pressure 154/91, pulse 93, temperature 98.3 F (36.8 C), temperature source Oral, resp. rate 18, height 6\' 3"  (1.905 m), weight 324 lb 7 oz (147.164 kg), SpO2 100 %. ECOG: 1 General appearance: alert and cooperative.  Obese gentleman not in any distress. Head:  Normocephalic, without obvious abnormality Neck: no adenopathy, no thyroid masses. Lymph nodes: Cervical, supraclavicular, and axillary nodes normal. Heart:regular rate and rhythm, S1, S2 normal, no murmur, click, rub or gallop Lung:chest clear, no wheezing, rales, normal symmetric air entry. Abdomen: soft, non-tender, without masses or organomegaly EXT:no erythema, induration, or nodules.  Neurological examination: No deficits.  Lab Results: Lab Results  Component Value Date   WBC 7.0 06/18/2015   HGB 13.3 06/18/2015   HCT 36.9* 06/18/2015   MCV 83.7 06/18/2015   PLT 126* 06/18/2015     Chemistry      Component Value Date/Time   NA 140 03/26/2015 0844   NA 140 01/01/2014 1213   K 3.0* 03/26/2015 0844   K 3.2* 01/01/2014 1213   CL 103 01/01/2014 1213   CL 104 03/09/2013 0921   CO2 28 03/26/2015 0844   CO2 27 01/01/2014 1213   BUN 15.3 03/26/2015 0844   BUN 10 01/01/2014 1213   CREATININE 0.8 03/26/2015 0844   CREATININE 0.83 01/01/2014 1213      Component Value Date/Time   CALCIUM 9.1 03/26/2015 0844   CALCIUM 8.5 01/01/2014 1213   ALKPHOS 96 03/26/2015 0844   ALKPHOS 115 01/01/2014 1213   AST 16 03/26/2015 0844   AST 18 01/01/2014 1213   ALT 16 03/26/2015 0844   ALT 14 01/01/2014 1213   BILITOT 0.42 03/26/2015 0844   BILITOT 0.7 01/01/2014 1213      Results for Gregory, Schneider (MRN [id number]) as of 06/18/2015 13:21  Ref. 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  He has been on Zytiga since 10/2013 and has tolerated it well. He noticed clinical improvement after the start of Zytiga especially in his pelvic pain and urinary symptoms.   He continues to have this benefit  without any major side effects associated with this medication. His last PSA was down to 0.23. The plan is to continue the same dose and schedule without any modification. I feel that his cancer is under reasonable control.  2. Androgen deprivation.  He S/P Bilateral orchiectomy.  3. Bony disease. He continues Zometa monthly at [facility]. He will continue his calcium and vitamin D supplements.    4. Hypertension: His following up with primary care physician. Losartan was started recently.  5. Back pain: Reasonably controlled with hydrocodone which she takes very infrequently. I have refilled his medication today.  6. Followup. In 12 weeks to assess complications from [facility].    [facility] MD 8/3/20161:31 PM

## 2015-06-19 ENCOUNTER — Telehealth: Payer: Self-pay | Admitting: *Deleted

## 2015-06-19 LAB — PSA: PSA: 0.27 ng/mL (ref <=4.00)

## 2015-06-19 NOTE — Telephone Encounter (Signed)
-----   Message from Wyatt Portela, MD sent at 06/19/2015  9:15 AM EDT ----- Please call his PSA

## 2015-06-19 NOTE — Telephone Encounter (Signed)
As noted below by Dr. Alen Blew, I informed patient that his PSA level was 0.27. Patient verbalized understanding.

## 2015-07-09 ENCOUNTER — Other Ambulatory Visit: Payer: Self-pay | Admitting: *Deleted

## 2015-07-09 DIAGNOSIS — C61 Malignant neoplasm of prostate: Secondary | ICD-10-CM

## 2015-07-09 MED ORDER — ABIRATERONE ACETATE 250 MG PO TABS: 1000.0000 mg | ORAL_TABLET | Freq: Every day | ORAL | Status: DC

## 2015-07-29 ENCOUNTER — Other Ambulatory Visit: Payer: Self-pay | Admitting: *Deleted

## 2015-07-29 ENCOUNTER — Telehealth: Payer: Self-pay | Admitting: *Deleted

## 2015-07-29 ENCOUNTER — Encounter: Payer: Self-pay | Admitting: Oncology

## 2015-07-29 DIAGNOSIS — C61 Malignant neoplasm of prostate: Secondary | ICD-10-CM

## 2015-07-29 MED ORDER — ABIRATERONE ACETATE 250 MG PO TABS: 1000.0000 mg | ORAL_TABLET | Freq: Every day | ORAL | Status: DC

## 2015-07-29 NOTE — Progress Notes (Signed)
I faxed [facility] req for zytiga [phone]

## 2015-07-29 NOTE — Telephone Encounter (Signed)
PT. NEEDS A NEW PRESCRIPTION FOR HIS ZYTIGA CALLED TO [facility]. IT WILL ALSO NEED A PRIOR AUTHORIZATION COMPLETED BEFORE THE MEDICATION CAN BE SHIPPED. THE PHONE NUMBER FOR [facility] IS [phone]. PT. WILL NEED HIS PRESCRIPTION BY THIS Sunday, 08/03/15. PLEASE CALL PT. WHEN THIS PROCESS IS COMPLETED SO HE CAN REQUEST HIS MEDICATION FROM [facility].

## 2015-07-29 NOTE — Telephone Encounter (Signed)
Gave printed prescription and message below to [employer]. They will get prior authorization for Zytiga.

## 2015-07-30 ENCOUNTER — Encounter: Payer: Self-pay | Admitting: Oncology

## 2015-07-30 ENCOUNTER — Telehealth: Payer: Self-pay | Admitting: *Deleted

## 2015-07-30 NOTE — Progress Notes (Signed)
Per Claiborne Billings at [facility]. The patient has new insurance [employer] and they are sending to [place] for processing.

## 2015-07-30 NOTE — Progress Notes (Signed)
Gregory Schneider with [facility] said it is coming up that his insurance expired 07/16/15. They will reach out to patient and see and work as uninsured. They will let us know. Request was sent for [facility]

## 2015-07-30 NOTE — Telephone Encounter (Signed)
VM message received from [facility] requesting fax'd prescription for pt's Zytiga.  It had been fax'd to [facility]. Their fax # is [phone]. Spoke with Dr. Hazeline Junker nurse and prescription will be fax'd to [place].

## 2015-07-31 ENCOUNTER — Encounter: Payer: Self-pay | Admitting: Oncology

## 2015-07-31 NOTE — Progress Notes (Signed)
[facility] [id number] approved zytiga from 07/31/15-05/29/16 [id number]

## 2015-08-18 ENCOUNTER — Other Ambulatory Visit: Payer: Self-pay | Admitting: *Deleted

## 2015-08-18 DIAGNOSIS — C61 Malignant neoplasm of prostate: Secondary | ICD-10-CM

## 2015-08-18 MED ORDER — ABIRATERONE ACETATE 250 MG PO TABS: 1000.0000 mg | ORAL_TABLET | Freq: Every day | ORAL | Status: DC

## 2015-08-20 ENCOUNTER — Telehealth: Payer: Self-pay | Admitting: *Deleted

## 2015-08-20 NOTE — Telephone Encounter (Signed)
Fax received from [facility] that this patient has transitioned to [facility].  Please update you r records to refer all future specialty prescriptions to [facility].  [address], [place]."  Preferred pharmacy list updated at this time.

## 2015-08-25 ENCOUNTER — Encounter: Payer: Self-pay | Admitting: *Deleted

## 2015-08-25 NOTE — Progress Notes (Signed)
Received faxed paperwork from Gregory Schneider for patient assistance. Paperwork taken to managed care.

## 2015-08-29 ENCOUNTER — Encounter: Payer: Self-pay | Admitting: Oncology

## 2015-08-29 NOTE — Progress Notes (Signed)
I placed j&J application on desk of nurse for dr. shadad. °

## 2015-09-01 ENCOUNTER — Encounter: Payer: Self-pay | Admitting: Oncology

## 2015-09-01 NOTE — Progress Notes (Signed)
I faxed j&j form to  [phone] and sent email to [email or web address]

## 2015-09-18 ENCOUNTER — Telehealth: Payer: Self-pay | Admitting: Oncology

## 2015-09-18 ENCOUNTER — Other Ambulatory Visit: Payer: Self-pay | Admitting: *Deleted

## 2015-09-18 ENCOUNTER — Other Ambulatory Visit (HOSPITAL_BASED_OUTPATIENT_CLINIC_OR_DEPARTMENT_OTHER): Payer: Medicare Other

## 2015-09-18 ENCOUNTER — Ambulatory Visit (HOSPITAL_BASED_OUTPATIENT_CLINIC_OR_DEPARTMENT_OTHER): Payer: Medicare Other | Admitting: Oncology

## 2015-09-18 VITALS — BP 152/90 | HR 69 | Temp 98.5°F | Resp 20 | Ht 75.0 in | Wt 322.8 lb

## 2015-09-18 DIAGNOSIS — E291 Testicular hypofunction: Secondary | ICD-10-CM

## 2015-09-18 DIAGNOSIS — I10 Essential (primary) hypertension: Secondary | ICD-10-CM | POA: Diagnosis not present

## 2015-09-18 DIAGNOSIS — C61 Malignant neoplasm of prostate: Secondary | ICD-10-CM | POA: Diagnosis not present

## 2015-09-18 DIAGNOSIS — C7951 Secondary malignant neoplasm of bone: Secondary | ICD-10-CM

## 2015-09-18 LAB — CBC WITH DIFFERENTIAL/PLATELET
BASO%: 1.1 % (ref 0.0–2.0)
Basophils Absolute: 0.1 10*3/uL (ref 0.0–0.1)
EOS%: 3.4 % (ref 0.0–7.0)
Eosinophils Absolute: 0.3 10*3/uL (ref 0.0–0.5)
HCT: 38.3 % — ABNORMAL LOW (ref 38.4–49.9)
HGB: 13.3 g/dL (ref 13.0–17.1)
LYMPH%: 21.9 % (ref 14.0–49.0)
MCH: 29.7 pg (ref 27.2–33.4)
MCHC: 34.6 g/dL (ref 32.0–36.0)
MCV: 85.7 fL (ref 79.3–98.0)
MONO#: 0.4 10*3/uL (ref 0.1–0.9)
MONO%: 5.4 % (ref 0.0–14.0)
NEUT#: 5.3 10*3/uL (ref 1.5–6.5)
NEUT%: 68.2 % (ref 39.0–75.0)
Platelets: 136 10*3/uL — ABNORMAL LOW (ref 140–400)
RBC: 4.47 10*6/uL (ref 4.20–5.82)
RDW: 14 % (ref 11.0–14.6)
WBC: 7.7 10*3/uL (ref 4.0–10.3)
lymph#: 1.7 10*3/uL (ref 0.9–3.3)

## 2015-09-18 LAB — COMPREHENSIVE METABOLIC PANEL (CC13)
ALT: 15 U/L (ref 0–55)
AST: 14 U/L (ref 5–34)
Albumin: 3.8 g/dL (ref 3.5–5.0)
Alkaline Phosphatase: 91 U/L (ref 40–150)
Anion Gap: 7 mEq/L (ref 3–11)
BUN: 18.1 mg/dL (ref 7.0–26.0)
CO2: 30 mEq/L — ABNORMAL HIGH (ref 22–29)
Calcium: 9.8 mg/dL (ref 8.4–10.4)
Chloride: 100 mEq/L (ref 98–109)
Creatinine: 1 mg/dL (ref 0.7–1.3)
EGFR: 88 mL/min/{1.73_m2} — ABNORMAL LOW (ref >90)
Glucose: 128 mg/dl (ref 70–140)
Potassium: 3.5 mEq/L (ref 3.5–5.1)
Sodium: 137 mEq/L (ref 136–145)
Total Bilirubin: 0.5 mg/dL (ref 0.20–1.20)
Total Protein: 7 g/dL (ref 6.4–8.3)

## 2015-09-18 MED ORDER — HYDROCODONE-ACETAMINOPHEN 5-325 MG PO TABS: 1.0000 | ORAL_TABLET | Freq: Four times a day (QID) | ORAL | Status: DC | PRN

## 2015-09-18 NOTE — Progress Notes (Signed)
Hematology and Oncology Follow Up Visit  Gregory Schneider [id number] 1956/04/03 59 y.o. 09/18/2015 9:04 AM   Principle Diagnosis: 59 year old with prostate cancer diagnosed in 2010. PSA was 15 and subsequently underwent a biopsy which showed a Gleason score 4 + 5 = 9 in the right and the left apex of his prostate, indicating a poorly-differentiated adenocarcinoma of the prostate. 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  Prior Therapy:  Combined androgen deprivation with Lupron and Casodex. He had a good response and then developed a rise in the PSA with castrate level testosterone.  He is S/P Bilateral simple orchiectomy done on 12/25/2012.  Provenge started on 03/03/12. Therapy Ended in 03/31/2012.   Current therapy:  Zytiga started in 10/2013.  He continues to be on Zometa given at  [facility].  Interim History:  Gregory Schneider returns today for a follow up visit. Since his last visit, he continues to do very well.He continues to tolerate Zytiga without any side effects. He does report increased joint stiffness and interfere with mobility at times. He has qualified for disability and hoping to get assistance in obtaining his medication. He reports that his mobility is slower than normal. He is requiring hydrocodone at nighttime. Does not report any urine or bowel incontinence. He reports no edema or trauma. He urinates regularly without any issues. He has not reported any recent hospitalization or illnesses. Has not reported any lower extremity edema. Has not reported any pathological fractures or bone pain.   He reports no chest pain, shortness of breath, dyspnea, abdominal pain, nausea, vomiting. He denies any hematuria. He has no headaches or dizziness. His performance status is at baseline. He is not reporting any nausea or vomiting. Does not report any syncope. Is not reporting any chest pain palpitation orthopnea but has reported increase in his blood  pressure. Does not report any cough or hemoptysis. Does not report any epistaxis, petechiae or lymphadenopathy. His review of system is otherwise unremarkable.  Medications: Reviewed today and no changes. Current Outpatient Prescriptions  Medication Sig Dispense Refill  . abiraterone Acetate (ZYTIGA) 250 MG tablet Take 4 tablets (1,000 mg total) by mouth daily. Take on an empty stomach 1 hour before or 2 hours after a meal 120 tablet 0  . buPROPion (WELLBUTRIN SR) 150 MG 12 hr tablet Take 150 mg by mouth daily.    . calcium carbonate (OS-CAL) 600 MG TABS Take 600 mg by mouth daily.    . calcium-vitamin D (CALCIUM 500/D) 500-200 MG-UNIT per tablet Take by mouth.    . cholecalciferol (VITAMIN D) 1000 UNITS tablet Take 1,000 Units by mouth daily.    . clonazePAM (KLONOPIN) 1 MG tablet Take 1 mg by mouth as needed for anxiety (1-2 tablets daily as needed).     . Cyanocobalamin (RA VITAMIN B-12 TR) 1000 MCG TBCR Take by mouth.    . losartan-hydrochlorothiazide (HYZAAR) 100-25 MG per tablet     . Multiple Vitamin (MULTIVITAMIN) tablet Take 1 tablet by mouth daily.    . sertraline (ZOLOFT) 100 MG tablet Take 200 mg by mouth every morning.     . Zoledronic Acid (ZOMETA) 4 MG/100ML IVPB Inject 4 mg into the vein every 30 (thirty) days.    Marland Kitchen HYDROcodone-acetaminophen (NORCO/VICODIN) 5-325 MG tablet Take 1-2 tablets by mouth every 6 (six) hours as needed. 60 tablet 0   No current facility-administered medications for this visit.    Allergies:  Allergies  Allergen Reactions  .  Penicillins Other (See Comments)    Unknown     His past medical history, social history reviewed and unchanged.  Physical Exam: Blood pressure 152/90, pulse 69, temperature 98.5 F (36.9 C), temperature source Oral, resp. rate 20, height 6\' 3"  (1.905 m), weight 322 lb 12.8 oz (146.421 kg), SpO2 98 %. ECOG: 1 General appearance: alert and cooperative.  Not in any distress. Head: Normocephalic, without obvious  abnormality no oral ulcers or lesions. Neck: no adenopathy, no thyroid masses. Lymph nodes: Cervical, supraclavicular, and axillary nodes normal. Heart:regular rate and rhythm, S1, S2 normal, no murmur, click, rub or gallop Lung:chest clear, no wheezing, rales, normal symmetric air entry. Abdomen: soft, non-tender, without masses or organomegaly shifting dullness or ascites. EXT:no erythema, induration, or nodules.  Neurological examination: No deficits. Skin: Showed no rashes or lesions.  Lab Results: Lab Results  Component Value Date   WBC 7.7 09/18/2015   HGB 13.3 09/18/2015   HCT 38.3* 09/18/2015   MCV 85.7 09/18/2015   PLT 136* 09/18/2015     Chemistry      Component Value Date/Time   NA 143 06/18/2015 1301   NA 140 01/01/2014 1213   K 3.1* 06/18/2015 1301   K 3.2* 01/01/2014 1213   CL 103 01/01/2014 1213   CL 104 03/09/2013 0921   CO2 32* 06/18/2015 1301   CO2 27 01/01/2014 1213   BUN 18.4 06/18/2015 1301   BUN 10 01/01/2014 1213   CREATININE 0.9 06/18/2015 1301   CREATININE 0.83 01/01/2014 1213      Component Value Date/Time   CALCIUM 9.5 06/18/2015 1301   CALCIUM 8.5 01/01/2014 1213   ALKPHOS 86 06/18/2015 1301   ALKPHOS 115 01/01/2014 1213   AST 18 06/18/2015 1301   AST 18 01/01/2014 1213   ALT 19 06/18/2015 1301   ALT 14 01/01/2014 1213   BILITOT 0.87 06/18/2015 1301   BILITOT 0.7 01/01/2014 1213        Results for Gregory Schneider, Gregory Schneider (MRN [id number]) as of 09/18/2015 08:45  Ref. 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  He has been on Zytiga since 10/2013 and has tolerated it well. He noticed clinical improvement after the start of Zytiga especially in his pelvic  pain and urinary symptoms.   His PSA remains under excellent control was 0.27 in August 2016. I plan on continuing the same dose and schedule of this medication. Has not reported any new complications that requires dose reduction or delays. Imaging studies will be repeated as needed or if his PSA increases in the near future. If his salvage therapy will be used upon symptomatic progression which she has not experienced.  2. Androgen deprivation.  He S/P Bilateral orchiectomy.  3. Bony disease. He continues Zometa monthly at [facility]. He will continue his calcium and vitamin D supplements.  No complications associated with this infusion.  4. Hypertension: His following up with primary care physician. Losartan was started recently. His blood pressure. Adequate.  5. Back pain: Reasonably controlled with hydrocodone which she takes very infrequently mostly at nighttime. Refill was given to the patient today.  6. Followup. In 3 months to assess for any new complications.    [facility] MD 11/3/20169:04 AM

## 2015-09-18 NOTE — Telephone Encounter (Signed)
Gave adn printed appt sched and avs for pt for Feb 2017 °

## 2015-09-19 ENCOUNTER — Telehealth: Payer: Self-pay | Admitting: *Deleted

## 2015-09-19 LAB — PSA: PSA: 0.38 ng/mL (ref <=4.00)

## 2015-09-19 NOTE — Telephone Encounter (Signed)
-----   Message from Wyatt Portela, MD sent at 09/19/2015  9:05 AM EDT ----- Please call his PSA. Still very low

## 2015-09-19 NOTE — Telephone Encounter (Signed)
As noted below by Dr. [name], I informed patient of his PSA level. Patient verbalized understanding. 

## 2015-09-22 ENCOUNTER — Encounter: Payer: Self-pay | Admitting: *Deleted

## 2015-09-22 NOTE — Progress Notes (Signed)
Spoke with patient, gave results of last PSA 

## 2015-09-26 ENCOUNTER — Other Ambulatory Visit: Payer: Self-pay | Admitting: *Deleted

## 2015-09-26 DIAGNOSIS — C61 Malignant neoplasm of prostate: Secondary | ICD-10-CM

## 2015-09-26 MED ORDER — ABIRATERONE ACETATE 250 MG PO TABS: 1000.0000 mg | ORAL_TABLET | Freq: Every day | ORAL | Status: DC

## 2015-10-10 ENCOUNTER — Other Ambulatory Visit: Payer: Self-pay | Admitting: *Deleted

## 2015-10-10 DIAGNOSIS — C61 Malignant neoplasm of prostate: Secondary | ICD-10-CM

## 2015-10-10 MED ORDER — ABIRATERONE ACETATE 250 MG PO TABS: 1000.0000 mg | ORAL_TABLET | Freq: Every day | ORAL | Status: DC

## 2015-11-06 ENCOUNTER — Other Ambulatory Visit: Payer: Self-pay | Admitting: *Deleted

## 2015-11-06 DIAGNOSIS — C61 Malignant neoplasm of prostate: Secondary | ICD-10-CM

## 2015-11-06 MED ORDER — ABIRATERONE ACETATE 250 MG PO TABS: 1000.0000 mg | ORAL_TABLET | Freq: Every day | ORAL | Status: DC

## 2015-11-11 ENCOUNTER — Other Ambulatory Visit: Payer: Self-pay | Admitting: *Deleted

## 2015-11-11 ENCOUNTER — Telehealth: Payer: Self-pay | Admitting: *Deleted

## 2015-11-11 DIAGNOSIS — C61 Malignant neoplasm of prostate: Secondary | ICD-10-CM

## 2015-11-11 DIAGNOSIS — C7951 Secondary malignant neoplasm of bone: Secondary | ICD-10-CM

## 2015-11-11 MED ORDER — HYDROCODONE-ACETAMINOPHEN 5-325 MG PO TABS: 1.0000 | ORAL_TABLET | Freq: Four times a day (QID) | ORAL | Status: DC | PRN

## 2015-11-11 NOTE — Telephone Encounter (Signed)
Left a message for patient instructing him that his prescription is ready for pickup.

## 2015-11-11 NOTE — Telephone Encounter (Signed)
Patient called requesting refill for Hydrocodone.  Return number when ready for pick up is [phone].

## 2015-11-26 ENCOUNTER — Telehealth: Payer: Self-pay | Admitting: *Deleted

## 2015-11-26 NOTE — Telephone Encounter (Signed)
Left a message on patient's cell phone to call [facility] and give [place] the name and address of the new 90 day mail order pharmacy he is switching to due to insurance. Please get an office and fax number to send the prescription. Also, when does he run out of the [place]?

## 2015-11-27 ENCOUNTER — Other Ambulatory Visit: Payer: Self-pay | Admitting: *Deleted

## 2015-11-27 DIAGNOSIS — C61 Malignant neoplasm of prostate: Secondary | ICD-10-CM

## 2015-11-27 MED ORDER — ABIRATERONE ACETATE 250 MG PO TABS: 1000.0000 mg | ORAL_TABLET | Freq: Every day | ORAL | Status: DC

## 2015-11-27 NOTE — Telephone Encounter (Signed)
Voicemail: "Message received yesterday asking for pharmacy information.  [employer] Fax number [phone].

## 2015-11-28 ENCOUNTER — Encounter: Payer: Self-pay | Admitting: Oncology

## 2015-11-28 NOTE — Progress Notes (Signed)
Per j&j zytiga was approved. I sent to medical records

## 2015-12-01 ENCOUNTER — Encounter: Payer: Self-pay | Admitting: *Deleted

## 2015-12-04 ENCOUNTER — Other Ambulatory Visit: Payer: Self-pay | Admitting: Oncology

## 2015-12-04 DIAGNOSIS — C61 Malignant neoplasm of prostate: Secondary | ICD-10-CM

## 2015-12-04 MED ORDER — ABIRATERONE ACETATE 250 MG PO TABS: 1000.0000 mg | ORAL_TABLET | Freq: Every day | ORAL | Status: DC

## 2015-12-09 ENCOUNTER — Encounter: Payer: Self-pay | Admitting: Oncology

## 2015-12-09 NOTE — Progress Notes (Signed)
Per [employer] zytiga approved 11/14/15-11/14/16 ref# [id number]. I sent to medical records

## 2015-12-15 ENCOUNTER — Encounter: Payer: Self-pay | Admitting: *Deleted

## 2015-12-15 ENCOUNTER — Other Ambulatory Visit: Payer: Self-pay | Admitting: *Deleted

## 2015-12-15 DIAGNOSIS — C61 Malignant neoplasm of prostate: Secondary | ICD-10-CM

## 2015-12-15 MED ORDER — ABIRATERONE ACETATE 250 MG PO TABS: 1000.0000 mg | ORAL_TABLET | Freq: Every day | ORAL | Status: DC

## 2015-12-15 NOTE — Progress Notes (Signed)
Faxed script for zytiga to [facility] [place] Per patient's request.

## 2015-12-17 ENCOUNTER — Other Ambulatory Visit: Payer: Self-pay | Admitting: *Deleted

## 2015-12-17 DIAGNOSIS — C61 Malignant neoplasm of prostate: Secondary | ICD-10-CM

## 2015-12-17 MED ORDER — ABIRATERONE ACETATE 250 MG PO TABS: 1000.0000 mg | ORAL_TABLET | Freq: Every day | ORAL | Status: DC

## 2015-12-17 MED FILL — ZYTIGA 250 MG TABLET: 250 | 30 days supply | Qty: 120 | Fill #0

## 2015-12-17 NOTE — Telephone Encounter (Signed)
zytiga script e-scribed to [place] out patient pharmacy. D/t [facility] unable to fill. ( sent to [facility] originally per patient's request )

## 2015-12-19 ENCOUNTER — Telehealth: Payer: Self-pay | Admitting: Oncology

## 2015-12-19 ENCOUNTER — Other Ambulatory Visit (HOSPITAL_BASED_OUTPATIENT_CLINIC_OR_DEPARTMENT_OTHER): Payer: Medicare HMO

## 2015-12-19 ENCOUNTER — Ambulatory Visit (HOSPITAL_BASED_OUTPATIENT_CLINIC_OR_DEPARTMENT_OTHER): Payer: Medicare HMO | Admitting: Oncology

## 2015-12-19 VITALS — BP 160/88 | HR 75 | Temp 98.3°F | Resp 19 | Wt 322.6 lb

## 2015-12-19 DIAGNOSIS — C61 Malignant neoplasm of prostate: Secondary | ICD-10-CM

## 2015-12-19 DIAGNOSIS — E291 Testicular hypofunction: Secondary | ICD-10-CM

## 2015-12-19 DIAGNOSIS — C7989 Secondary malignant neoplasm of other specified sites: Secondary | ICD-10-CM

## 2015-12-19 DIAGNOSIS — I10 Essential (primary) hypertension: Secondary | ICD-10-CM

## 2015-12-19 DIAGNOSIS — C7951 Secondary malignant neoplasm of bone: Secondary | ICD-10-CM

## 2015-12-19 LAB — COMPREHENSIVE METABOLIC PANEL
ALT: 17 U/L (ref 0–55)
AST: 18 U/L (ref 5–34)
Albumin: 4.1 g/dL (ref 3.5–5.0)
Alkaline Phosphatase: 96 U/L (ref 40–150)
Anion Gap: 12 mEq/L — ABNORMAL HIGH (ref 3–11)
BUN: 15.6 mg/dL (ref 7.0–26.0)
CO2: 26 mEq/L (ref 22–29)
Calcium: 9.6 mg/dL (ref 8.4–10.4)
Chloride: 99 mEq/L (ref 98–109)
Creatinine: 0.9 mg/dL (ref 0.7–1.3)
EGFR: >90 mL/min/{1.73_m2} (ref >90)
Glucose: 104 mg/dl (ref 70–140)
Potassium: 3.5 mEq/L (ref 3.5–5.1)
Sodium: 138 mEq/L (ref 136–145)
Total Bilirubin: 0.51 mg/dL (ref 0.20–1.20)
Total Protein: 7.4 g/dL (ref 6.4–8.3)

## 2015-12-19 LAB — CBC WITH DIFFERENTIAL/PLATELET
BASO%: 0.3 % (ref 0.0–2.0)
Basophils Absolute: 0 10*3/uL (ref 0.0–0.1)
EOS%: 4.5 % (ref 0.0–7.0)
Eosinophils Absolute: 0.3 10*3/uL (ref 0.0–0.5)
HCT: 38.4 % (ref 38.4–49.9)
HGB: 13.5 g/dL (ref 13.0–17.1)
LYMPH%: 28.4 % (ref 14.0–49.0)
MCH: 29.2 pg (ref 27.2–33.4)
MCHC: 35.2 g/dL (ref 32.0–36.0)
MCV: 82.9 fL (ref 79.3–98.0)
MONO#: 0.3 10*3/uL (ref 0.1–0.9)
MONO%: 4.5 % (ref 0.0–14.0)
NEUT#: 4.7 10*3/uL (ref 1.5–6.5)
NEUT%: 62.3 % (ref 39.0–75.0)
Platelets: 131 10*3/uL — ABNORMAL LOW (ref 140–400)
RBC: 4.63 10*6/uL (ref 4.20–5.82)
RDW: 13.8 % (ref 11.0–14.6)
WBC: 7.6 10*3/uL (ref 4.0–10.3)
lymph#: 2.2 10*3/uL (ref 0.9–3.3)

## 2015-12-19 MED ORDER — HYDROCODONE-ACETAMINOPHEN 5-325 MG PO TABS
1.0000 | ORAL_TABLET | Freq: Four times a day (QID) | ORAL | Status: DC | PRN
Start: 2015-12-19 — End: 2016-03-24

## 2015-12-19 NOTE — Progress Notes (Signed)
Hematology and Oncology Follow Up Visit  Mont Hiatt [id number] 04/17/1956 60 y.o. 12/19/2015 10:05 AM   Principle Diagnosis: 60 year old with prostate cancer diagnosed in 2010. PSA was 15 and subsequently underwent a biopsy which showed a Gleason score 4 + 5 = 9 in the right and the left apex of his prostate, indicating a poorly-differentiated adenocarcinoma of the prostate. 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  Prior Therapy:  Combined androgen deprivation with Lupron and Casodex. He had a good response and then developed a rise in the PSA with castrate level testosterone.  He is S/P Bilateral simple orchiectomy done on 12/25/2012.  Provenge started on 03/03/12. Therapy Ended in 03/31/2012.   Current therapy:  Zytiga started in 10/2013.  He continues to be on Zometa given at  [facility].  Interim History:  Mr. Osoria returns today for a follow up visit. Since his last visit, he reports no recent changes in his health. He continues to tolerate Zytiga without any side effects. He is requiring hydrocodone at nighttime at times but not regularly. Does not report any urine or bowel incontinence. He reports no edema or trauma. He urinates regularly without any issues. He has not reported any recent hospitalization or illnesses. Has not reported any lower extremity edema. Has not reported any pathological fractures or bone pain.   He is trying to exercise more regularly although slightly limited by joint stiffness at times.  He reports no chest pain, shortness of breath, dyspnea, abdominal pain, nausea, vomiting. He denies any hematuria. He has no headaches or dizziness. His performance status is at baseline. He is not reporting any nausea or vomiting. Does not report any syncope. Is not reporting any chest pain palpitation orthopnea but has reported increase in his blood pressure. Does not report any cough or hemoptysis. Does not report any epistaxis,  petechiae or lymphadenopathy. His review of system is otherwise unremarkable.  Medications: Reviewed today and no changes. Current Outpatient Prescriptions  Medication Sig Dispense Refill  . abiraterone Acetate (ZYTIGA) 250 MG tablet Take 4 tablets (1,000 mg total) by mouth daily. Take on an empty stomach 1 hour before or 2 hours after a meal 120 tablet 0  . buPROPion (WELLBUTRIN SR) 150 MG 12 hr tablet Take 150 mg by mouth daily.    . calcium carbonate (OS-CAL) 600 MG TABS Take 600 mg by mouth daily.    . calcium-vitamin D (CALCIUM 500/D) 500-200 MG-UNIT per tablet Take by mouth.    . cholecalciferol (VITAMIN D) 1000 UNITS tablet Take 1,000 Units by mouth daily.    . clonazePAM (KLONOPIN) 1 MG tablet Take 1 mg by mouth as needed for anxiety (1-2 tablets daily as needed).     . Cyanocobalamin (RA VITAMIN B-12 TR) 1000 MCG TBCR Take by mouth.    Marland Kitchen HYDROcodone-acetaminophen (NORCO/VICODIN) 5-325 MG tablet Take 1-2 tablets by mouth every 6 (six) hours as needed. 60 tablet 0  . losartan-hydrochlorothiazide (HYZAAR) 100-25 MG per tablet     . Multiple Vitamin (MULTIVITAMIN) tablet Take 1 tablet by mouth daily.    . sertraline (ZOLOFT) 100 MG tablet Take 200 mg by mouth every morning.     . Zoledronic Acid (ZOMETA) 4 MG/100ML IVPB Inject 4 mg into the vein every 30 (thirty) days.     No current facility-administered medications for this visit.    Allergies:  Allergies  Allergen Reactions  . Penicillins Other (See Comments)    Unknown  His past medical history, social history reviewed and unchanged.  Physical Exam: Blood pressure 160/88, pulse 75, temperature 98.3 F (36.8 C), temperature source Oral, resp. rate 19, weight 322 lb 9.6 oz (146.33 kg), SpO2 100 %. ECOG: 1 General appearance: alert and cooperative. Without distress Head: Normocephalic, without obvious abnormality no oral thrush Neck: no adenopathy, no thyroid masses. Lymph nodes: Cervical, supraclavicular, and axillary  nodes normal. Heart:regular rate and rhythm, S1, S2 normal, no murmur, click, rub or gallop Lung:chest clear, no wheezing, rales, normal symmetric air entry. Abdomen: soft, non-tender, without masses or organomegaly. No rebound or guarding.  EXT:no erythema, induration, or nodules.  Neurological examination: No deficits. Able to ambulate without any difficulties.   Lab Results: Lab Results  Component Value Date   WBC 7.6 12/19/2015   HGB 13.5 12/19/2015   HCT 38.4 12/19/2015   MCV 82.9 12/19/2015   PLT 131* 12/19/2015     Chemistry      Component Value Date/Time   NA 137 09/18/2015 0845   NA 140 01/01/2014 1213   K 3.5 09/18/2015 0845   K 3.2* 01/01/2014 1213   CL 103 01/01/2014 1213   CL 104 03/09/2013 0921   CO2 30* 09/18/2015 0845   CO2 27 01/01/2014 1213   BUN 18.1 09/18/2015 0845   BUN 10 01/01/2014 1213   CREATININE 1.0 09/18/2015 0845   CREATININE 0.83 01/01/2014 1213      Component Value Date/Time   CALCIUM 9.8 09/18/2015 0845   CALCIUM 8.5 01/01/2014 1213   ALKPHOS 91 09/18/2015 0845   ALKPHOS 115 01/01/2014 1213   AST 14 09/18/2015 0845   AST 18 01/01/2014 1213   ALT 15 09/18/2015 0845   ALT 14 01/01/2014 1213   BILITOT 0.50 09/18/2015 0845   BILITOT 0.7 01/01/2014 1213      Results for OAKLAN, SERVEN (MRN [id number]) as of 12/19/2015 09:47  Ref. Range 03/26/2015 08:44 06/18/2015 13:01 09/18/2015 08:45  PSA Latest Ref Range: <=4.00 ng/mL 0.23 0.27 0.38        Impression and Plan: This is a 61 year old gentleman with the following issues:  1. 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  He did receive Provenge immunotherapy as mentioned previously.  He has been on Zytiga since 10/2013 and has tolerated it well. He noticed clinical improvement after the start of Zytiga especially in his pelvic pain and urinary symptoms.   His PSA has been under excellent control for last 2  years. Continues to be very low with a nadir close to 0.21. Most recently his PSA slightly up to 0.38 but continues to be asymptomatic. The plan is to continue Zytiga for the time being and repeat a staging workup with his PSA starts to rise rapidly.   2. Androgen deprivation.  He S/P Bilateral orchiectomy.  3. Bony disease. He continues Zometa monthly at [facility]. He will continue his calcium and vitamin D supplements.  He have not received this as of late because of dental complications. This will be resumed in February 2017.  4. Hypertension: His following up with primary care physician. Losartan was started recently. His blood pressure is adequate.  5. Back pain: Reasonably controlled with hydrocodone which she takes very infrequently mostly at nighttime. Refill was given to the patient today.  6. Followup. In 3 months to assess for any new complications.    [facility] MD 2/3/201710:05 AM

## 2015-12-19 NOTE — Telephone Encounter (Signed)
Pt confirmed labs/ov per 02/03 POF, gave pt AVS and Calendar... KJ

## 2015-12-20 LAB — PSA: Prostate Specific Ag, Serum: 0.7 ng/mL (ref 0.0–4.0)

## 2015-12-20 LAB — PSA (PARALLEL TESTING): PSA: 0.75 ng/mL (ref <=4.00)

## 2015-12-22 ENCOUNTER — Telehealth: Payer: Self-pay | Admitting: *Deleted

## 2015-12-22 NOTE — Telephone Encounter (Signed)
-----   Message from Wyatt Portela, MD sent at 12/22/2015 10:33 AM EST ----- Please call him with his PSA. Slightly up but still under control.

## 2015-12-22 NOTE — Telephone Encounter (Signed)
As noted below by Dr. [name], I informed patient of his PSA level. Patient verbalized understanding. 

## 2015-12-30 DIAGNOSIS — C61 Malignant neoplasm of prostate: Secondary | ICD-10-CM | POA: Diagnosis not present

## 2015-12-30 DIAGNOSIS — Z79899 Other long term (current) drug therapy: Secondary | ICD-10-CM | POA: Diagnosis not present

## 2015-12-30 DIAGNOSIS — M810 Age-related osteoporosis without current pathological fracture: Secondary | ICD-10-CM | POA: Diagnosis not present

## 2015-12-30 DIAGNOSIS — C7951 Secondary malignant neoplasm of bone: Secondary | ICD-10-CM | POA: Diagnosis not present

## 2016-01-13 DIAGNOSIS — I10 Essential (primary) hypertension: Secondary | ICD-10-CM | POA: Diagnosis not present

## 2016-01-13 DIAGNOSIS — Z79899 Other long term (current) drug therapy: Secondary | ICD-10-CM | POA: Diagnosis not present

## 2016-01-13 DIAGNOSIS — C61 Malignant neoplasm of prostate: Secondary | ICD-10-CM | POA: Diagnosis not present

## 2016-01-13 DIAGNOSIS — R69 Illness, unspecified: Secondary | ICD-10-CM | POA: Diagnosis not present

## 2016-01-30 MED FILL — ZYTIGA 250 MG TABLET: 250 | 30 days supply | Qty: 120 | Fill #0

## 2016-02-08 DIAGNOSIS — J40 Bronchitis, not specified as acute or chronic: Secondary | ICD-10-CM | POA: Diagnosis not present

## 2016-02-13 DIAGNOSIS — Z79899 Other long term (current) drug therapy: Secondary | ICD-10-CM | POA: Diagnosis not present

## 2016-02-13 DIAGNOSIS — C61 Malignant neoplasm of prostate: Secondary | ICD-10-CM | POA: Diagnosis not present

## 2016-02-13 DIAGNOSIS — M810 Age-related osteoporosis without current pathological fracture: Secondary | ICD-10-CM | POA: Diagnosis not present

## 2016-02-13 DIAGNOSIS — C7951 Secondary malignant neoplasm of bone: Secondary | ICD-10-CM | POA: Diagnosis not present

## 2016-03-01 ENCOUNTER — Other Ambulatory Visit: Payer: Self-pay | Admitting: Oncology

## 2016-03-01 MED FILL — ZYTIGA 250 MG TABLET: 250 | 30 days supply | Qty: 120 | Fill #0

## 2016-03-24 ENCOUNTER — Other Ambulatory Visit: Payer: Self-pay | Admitting: *Deleted

## 2016-03-24 ENCOUNTER — Telehealth: Payer: Self-pay | Admitting: *Deleted

## 2016-03-24 ENCOUNTER — Ambulatory Visit (HOSPITAL_BASED_OUTPATIENT_CLINIC_OR_DEPARTMENT_OTHER): Payer: Medicare HMO | Admitting: Oncology

## 2016-03-24 ENCOUNTER — Other Ambulatory Visit (HOSPITAL_BASED_OUTPATIENT_CLINIC_OR_DEPARTMENT_OTHER): Payer: Medicare HMO

## 2016-03-24 ENCOUNTER — Telehealth: Payer: Self-pay | Admitting: Oncology

## 2016-03-24 VITALS — BP 149/81 | HR 79 | Temp 98.2°F | Resp 18 | Ht 75.0 in | Wt 324.8 lb

## 2016-03-24 DIAGNOSIS — E291 Testicular hypofunction: Secondary | ICD-10-CM | POA: Diagnosis not present

## 2016-03-24 DIAGNOSIS — C7951 Secondary malignant neoplasm of bone: Secondary | ICD-10-CM

## 2016-03-24 DIAGNOSIS — C61 Malignant neoplasm of prostate: Secondary | ICD-10-CM | POA: Diagnosis not present

## 2016-03-24 DIAGNOSIS — I10 Essential (primary) hypertension: Secondary | ICD-10-CM | POA: Diagnosis not present

## 2016-03-24 LAB — COMPREHENSIVE METABOLIC PANEL
ALT: 18 U/L (ref 0–55)
AST: 18 U/L (ref 5–34)
Albumin: 4 g/dL (ref 3.5–5.0)
Alkaline Phosphatase: 93 U/L (ref 40–150)
Anion Gap: 10 mEq/L (ref 3–11)
BUN: 19.8 mg/dL (ref 7.0–26.0)
CO2: 28 mEq/L (ref 22–29)
Calcium: 9.2 mg/dL (ref 8.4–10.4)
Chloride: 99 mEq/L (ref 98–109)
Creatinine: 1 mg/dL (ref 0.7–1.3)
EGFR: 85 mL/min/{1.73_m2} — ABNORMAL LOW (ref >90)
Glucose: 128 mg/dl (ref 70–140)
Potassium: 2.8 mEq/L — CL (ref 3.5–5.1)
Sodium: 138 mEq/L (ref 136–145)
Total Bilirubin: 0.62 mg/dL (ref 0.20–1.20)
Total Protein: 7.4 g/dL (ref 6.4–8.3)

## 2016-03-24 LAB — CBC WITH DIFFERENTIAL/PLATELET
BASO%: 0.4 % (ref 0.0–2.0)
Basophils Absolute: 0 10*3/uL (ref 0.0–0.1)
EOS%: 5.1 % (ref 0.0–7.0)
Eosinophils Absolute: 0.3 10*3/uL (ref 0.0–0.5)
HCT: 38.6 % (ref 38.4–49.9)
HGB: 13.2 g/dL (ref 13.0–17.1)
LYMPH%: 22.3 % (ref 14.0–49.0)
MCH: 29.8 pg (ref 27.2–33.4)
MCHC: 34.3 g/dL (ref 32.0–36.0)
MCV: 87.1 fL (ref 79.3–98.0)
MONO#: 0.3 10*3/uL (ref 0.1–0.9)
MONO%: 5 % (ref 0.0–14.0)
NEUT#: 4.5 10*3/uL (ref 1.5–6.5)
NEUT%: 67.2 % (ref 39.0–75.0)
Platelets: 130 10*3/uL — ABNORMAL LOW (ref 140–400)
RBC: 4.43 10*6/uL (ref 4.20–5.82)
RDW: 14.8 % — ABNORMAL HIGH (ref 11.0–14.6)
WBC: 6.7 10*3/uL (ref 4.0–10.3)
lymph#: 1.5 10*3/uL (ref 0.9–3.3)

## 2016-03-24 MED ORDER — POTASSIUM CHLORIDE CRYS ER 20 MEQ PO TBCR: 20.0000 meq | EXTENDED_RELEASE_TABLET | Freq: Every day | ORAL | Status: DC

## 2016-03-24 MED ORDER — HYDROCODONE-ACETAMINOPHEN 5-325 MG PO TABS: 1.0000 | ORAL_TABLET | Freq: Four times a day (QID) | ORAL | Status: DC | PRN

## 2016-03-24 NOTE — Telephone Encounter (Signed)
Pt notified potassium level is low at 2.8. Needs to take K-Dur 6meq daily. States he has some at home, RN will send prescription to his pharmacy as well.

## 2016-03-24 NOTE — Telephone Encounter (Signed)
Gave pt apt & avs °

## 2016-03-24 NOTE — Progress Notes (Signed)
Hematology and Oncology Follow Up Visit  Rishaan Rodkey [id number] May 17, 1956 60 y.o. 03/24/2016 9:54 AM   Principle Diagnosis: 60 year old with prostate cancer diagnosed in 2010. PSA was 15 and subsequently underwent a biopsy which showed a Gleason score 4 + 5 = 9 in the right and the left apex of his prostate, indicating a poorly-differentiated adenocarcinoma of the prostate. 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  Prior Therapy:  Combined androgen deprivation with Lupron and Casodex. He had a good response and then developed a rise in the PSA with castrate level testosterone.  He is S/P Bilateral simple orchiectomy done on 12/25/2012.  Provenge started on 03/03/12. Therapy Ended in 03/31/2012.   Current therapy:  Zytiga started in 10/2013.  He continues to be on Zometa given at  [facility].  Interim History:  Mr. Altier returns today for a follow up visit. Since his last visit, he continues to be relatively stable without any changes in his health. He reports no recent complaints or hospitalizations.  He continues to tolerate Zytiga without any side effects. He denied any fluid retention, dyspepsia or any GI complaints. He is requiring hydrocodone at nighttime at times but not regularly. Does not report any urine or bowel incontinence. He urinates regularly without any issues. He continues to attend to her activities of daily living without any major decline. 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   He has no headaches or dizziness. He reports no chest pain, shortness of breath, dyspnea, abdominal pain, nausea, vomiting. He denies any hematuria. He is not reporting any nausea or vomiting. Does not report any syncope. Is not reporting any chest pain palpitation orthopnea but has reported increase in his blood pressure. Does not report any cough or hemoptysis. Does not report any epistaxis, petechiae or lymphadenopathy. His review of system  is otherwise unremarkable.  Medications: Reviewed today and no changes. Current Outpatient Prescriptions  Medication Sig Dispense Refill  . abiraterone Acetate (ZYTIGA) 250 MG tablet Take 4 tablets (1,000 mg total) by mouth daily. Take on an empty stomach 1 hour before or 2 hours after a meal 120 tablet 0  . amLODipine (NORVASC) 5 MG tablet Take by mouth.    Marland Kitchen buPROPion (WELLBUTRIN SR) 150 MG 12 hr tablet Take 150 mg by mouth daily.    . calcium carbonate (OS-CAL) 600 MG TABS Take 600 mg by mouth daily.    . calcium-vitamin D (CALCIUM 500/D) 500-200 MG-UNIT per tablet Take by mouth.    . cholecalciferol (VITAMIN D) 1000 UNITS tablet Take 1,000 Units by mouth daily.    . clonazePAM (KLONOPIN) 1 MG tablet Take 1 mg by mouth as needed for anxiety (1-2 tablets daily as needed).     . Cyanocobalamin (RA VITAMIN B-12 TR) 1000 MCG TBCR Take by mouth.    Marland Kitchen HYDROcodone-acetaminophen (NORCO/VICODIN) 5-325 MG tablet Take 1-2 tablets by mouth every 6 (six) hours as needed. 60 tablet 0  . Multiple Vitamin (MULTIVITAMIN) tablet Take 1 tablet by mouth daily.    . sertraline (ZOLOFT) 100 MG tablet Take 200 mg by mouth every morning.     . Zoledronic Acid (ZOMETA) 4 MG/100ML IVPB Inject 4 mg into the vein every 30 (thirty) days.    Marland Kitchen ZYTIGA 250 MG tablet TAKE 4 TABLETS BY MOUTH DAILY. TAKE ON AN EMPTY STOMACH 1 HOUR BEFORE OR 2 HOURS AFTER A MEAL 120 tablet 0  . losartan-hydrochlorothiazide (HYZAAR) 100-25 MG per tablet  No current facility-administered medications for this visit.    Allergies:  Allergies  Allergen Reactions  . Penicillins Other (See Comments)    Unknown     His past medical history, social history reviewed and unchanged.  Physical Exam: Blood pressure 149/81, pulse 79, temperature 98.2 F (36.8 C), temperature source Oral, resp. rate 18, height 6\' 3"  (1.905 m), weight 324 lb 12.8 oz (147.328 kg), SpO2 100 %. ECOG: 1 General appearance: Well-appearing gentleman without  distress. Head: Normocephalic, without obvious abnormality no oral thrush Neck: no adenopathy, no thyroid masses. Lymph nodes: Cervical, supraclavicular, and axillary nodes normal. Heart:regular rate and rhythm, S1, S2 normal, no murmur, click, rub or gallop Lung:chest clear, no wheezing, rales, normal symmetric air entry. Abdomen: soft, non-tender, without masses or organomegaly. Obese abdomen without rebound or guarding.  EXT:no erythema, induration, or nodules.  Neurological examination: No motor, sensory deficits.   Lab Results: Lab Results  Component Value Date   WBC 6.7 03/24/2016   HGB 13.2 03/24/2016   HCT 38.6 03/24/2016   MCV 87.1 03/24/2016   PLT 130* 03/24/2016     Chemistry      Component Value Date/Time   NA 138 12/19/2015 0937   NA 140 01/01/2014 1213   K 3.5 12/19/2015 0937   K 3.2* 01/01/2014 1213   CL 103 01/01/2014 1213   CL 104 03/09/2013 0921   CO2 26 12/19/2015 0937   CO2 27 01/01/2014 1213   BUN 15.6 12/19/2015 0937   BUN 10 01/01/2014 1213   CREATININE 0.9 12/19/2015 0937   CREATININE 0.83 01/01/2014 1213      Component Value Date/Time   CALCIUM 9.6 12/19/2015 0937   CALCIUM 8.5 01/01/2014 1213   ALKPHOS 96 12/19/2015 0937   ALKPHOS 115 01/01/2014 1213   AST 18 12/19/2015 0937   AST 18 01/01/2014 1213   ALT 17 12/19/2015 0937   ALT 14 01/01/2014 1213   BILITOT 0.51 12/19/2015 0937   BILITOT 0.7 01/01/2014 1213      Results for DELMON, KONDO (MRN [id number]) as of 03/24/2016 09:22  Ref. Range 09/18/2015 08:45 12/19/2015 09:37  PSA Latest Ref Range: <=4.00 ng/mL 0.38 0.75       Impression and Plan: This is a 60 year old gentleman with the following issues:  1. 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  He did receive Provenge immunotherapy as mentioned previously.  He has been on Zytiga since 10/2013 and has tolerated it well. He continues to have  excellent PSA control as well as clinical improvement in his symptoms of pelvic pain. The plan is to continue with Zytiga of the same dose and schedule and uses different salvage therapy upon symptomatic progression.   2. Androgen deprivation.  He S/P Bilateral orchiectomy.  3. Bony disease. He continues Zometa monthly at [facility]. He will continue his calcium and vitamin D supplements.  No recent complications to this therapy noted.  4. Hypertension: His following up with primary care physician. Losartan was started recently. His blood pressure is under better control at this time.  5. Back pain: Reasonably controlled with hydrocodone which she takes very infrequently mostly at nighttime. Refill was given to the patient today.  6. Followup. In 3 months to assess for any new complications.    [facility] MD 5/10/20179:54 AM

## 2016-03-25 ENCOUNTER — Telehealth: Payer: Self-pay | Admitting: *Deleted

## 2016-03-25 LAB — PSA: Prostate Specific Ag, Serum: 1.1 ng/mL (ref 0.0–4.0)

## 2016-03-25 NOTE — Telephone Encounter (Signed)
-----   Message from Wyatt Portela, MD sent at 03/25/2016  8:15 AM EDT ----- Please let him know his PSA. Slightly up but still very low and no change is needed.

## 2016-03-25 NOTE — Telephone Encounter (Signed)
Unable to reach pt. LMOVM ,Notified pt of PSA results, no changes needed. Pt to call office with any concerns.

## 2016-03-29 ENCOUNTER — Other Ambulatory Visit: Payer: Self-pay | Admitting: Oncology

## 2016-03-29 DIAGNOSIS — C7951 Secondary malignant neoplasm of bone: Secondary | ICD-10-CM

## 2016-03-29 DIAGNOSIS — C61 Malignant neoplasm of prostate: Secondary | ICD-10-CM

## 2016-03-29 MED FILL — ZYTIGA 250 MG TABLET: 250 | 30 days supply | Qty: 120 | Fill #0

## 2016-04-01 DIAGNOSIS — M810 Age-related osteoporosis without current pathological fracture: Secondary | ICD-10-CM | POA: Diagnosis not present

## 2016-04-01 DIAGNOSIS — Z0000 Encounter for general adult medical examination without abnormal findings: Secondary | ICD-10-CM | POA: Diagnosis not present

## 2016-04-01 DIAGNOSIS — C61 Malignant neoplasm of prostate: Secondary | ICD-10-CM | POA: Diagnosis not present

## 2016-04-30 ENCOUNTER — Other Ambulatory Visit: Payer: Self-pay | Admitting: Oncology

## 2016-04-30 MED FILL — ZYTIGA 250 MG TABLET: 250 | 30 days supply | Qty: 120 | Fill #0

## 2016-05-07 DIAGNOSIS — C61 Malignant neoplasm of prostate: Secondary | ICD-10-CM | POA: Diagnosis not present

## 2016-05-07 DIAGNOSIS — M810 Age-related osteoporosis without current pathological fracture: Secondary | ICD-10-CM | POA: Diagnosis not present

## 2016-05-24 ENCOUNTER — Other Ambulatory Visit: Payer: Self-pay | Admitting: *Deleted

## 2016-05-24 ENCOUNTER — Encounter: Payer: Self-pay | Admitting: *Deleted

## 2016-05-24 DIAGNOSIS — C61 Malignant neoplasm of prostate: Secondary | ICD-10-CM

## 2016-05-24 MED ORDER — ABIRATERONE ACETATE 250 MG PO TABS: 1000.0000 mg | ORAL_TABLET | Freq: Every day | ORAL | Status: DC

## 2016-05-31 ENCOUNTER — Other Ambulatory Visit: Payer: Self-pay | Admitting: Oncology

## 2016-05-31 MED FILL — ZYTIGA 250 MG TABLET: 250 | 30 days supply | Qty: 120 | Fill #0

## 2016-06-22 ENCOUNTER — Other Ambulatory Visit: Payer: Self-pay | Admitting: Oncology

## 2016-06-24 ENCOUNTER — Other Ambulatory Visit (HOSPITAL_BASED_OUTPATIENT_CLINIC_OR_DEPARTMENT_OTHER): Payer: Medicare HMO

## 2016-06-24 ENCOUNTER — Encounter: Payer: Self-pay | Admitting: Pharmacist

## 2016-06-24 ENCOUNTER — Telehealth: Payer: Self-pay | Admitting: Oncology

## 2016-06-24 ENCOUNTER — Ambulatory Visit (HOSPITAL_BASED_OUTPATIENT_CLINIC_OR_DEPARTMENT_OTHER): Payer: Medicare HMO | Admitting: Oncology

## 2016-06-24 VITALS — BP 172/93 | HR 69 | Temp 98.2°F | Resp 20 | Ht 75.0 in | Wt 329.9 lb

## 2016-06-24 DIAGNOSIS — E291 Testicular hypofunction: Secondary | ICD-10-CM | POA: Diagnosis not present

## 2016-06-24 DIAGNOSIS — C7951 Secondary malignant neoplasm of bone: Secondary | ICD-10-CM

## 2016-06-24 DIAGNOSIS — E876 Hypokalemia: Secondary | ICD-10-CM

## 2016-06-24 DIAGNOSIS — C61 Malignant neoplasm of prostate: Secondary | ICD-10-CM | POA: Diagnosis not present

## 2016-06-24 DIAGNOSIS — I10 Essential (primary) hypertension: Secondary | ICD-10-CM

## 2016-06-24 LAB — COMPREHENSIVE METABOLIC PANEL
ALT: 15 U/L (ref 0–55)
AST: 16 U/L (ref 5–34)
Albumin: 3.8 g/dL (ref 3.5–5.0)
Alkaline Phosphatase: 72 U/L (ref 40–150)
Anion Gap: 10 mEq/L (ref 3–11)
BUN: 15.3 mg/dL (ref 7.0–26.0)
CO2: 29 mEq/L (ref 22–29)
Calcium: 9.4 mg/dL (ref 8.4–10.4)
Chloride: 100 mEq/L (ref 98–109)
Creatinine: 0.9 mg/dL (ref 0.7–1.3)
EGFR: >90 mL/min/{1.73_m2} (ref >90)
Glucose: 104 mg/dl (ref 70–140)
Potassium: 3.5 mEq/L (ref 3.5–5.1)
Sodium: 139 mEq/L (ref 136–145)
Total Bilirubin: 0.9 mg/dL (ref 0.20–1.20)
Total Protein: 7.1 g/dL (ref 6.4–8.3)

## 2016-06-24 LAB — CBC WITH DIFFERENTIAL/PLATELET
BASO%: 0.2 % (ref 0.0–2.0)
Basophils Absolute: 0 10*3/uL (ref 0.0–0.1)
EOS%: 2.8 % (ref 0.0–7.0)
Eosinophils Absolute: 0.2 10*3/uL (ref 0.0–0.5)
HCT: 35.7 % — ABNORMAL LOW (ref 38.4–49.9)
HGB: 12.4 g/dL — ABNORMAL LOW (ref 13.0–17.1)
LYMPH%: 22.7 % (ref 14.0–49.0)
MCH: 30 pg (ref 27.2–33.4)
MCHC: 34.7 g/dL (ref 32.0–36.0)
MCV: 86.4 fL (ref 79.3–98.0)
MONO#: 0.3 10*3/uL (ref 0.1–0.9)
MONO%: 5.5 % (ref 0.0–14.0)
NEUT#: 3.9 10*3/uL (ref 1.5–6.5)
NEUT%: 68.8 % (ref 39.0–75.0)
Platelets: 108 10*3/uL — ABNORMAL LOW (ref 140–400)
RBC: 4.13 10*6/uL — ABNORMAL LOW (ref 4.20–5.82)
RDW: 14.2 % (ref 11.0–14.6)
WBC: 5.6 10*3/uL (ref 4.0–10.3)
lymph#: 1.3 10*3/uL (ref 0.9–3.3)

## 2016-06-24 MED ORDER — HYDROCODONE-ACETAMINOPHEN 5-325 MG PO TABS: 1.0000 | ORAL_TABLET | Freq: Four times a day (QID) | ORAL | 0 refills | Status: DC | PRN

## 2016-06-24 NOTE — Telephone Encounter (Signed)
per pof to sh pt appt-gave pt opy of avs/cal-adv central sch will call to sch trmt

## 2016-06-24 NOTE — Progress Notes (Signed)
Oral Chemotherapy Pharmacist Encounter   Received voicemail from [employer] patient assistance in reference to patient's Zytiga prescription. A new pharmacy will be handling their fills for home delivery and the new pharmacy needs a new Zytiga prescription prior to the patient's next fill.  Received form via fax and passed this onto Erline Levine, RN who is at the desk of Dr. Alen Blew today. Per Erline Levine, refill form completed and faxed.  Johny Drilling, PharmD, BCPS [facility]

## 2016-06-24 NOTE — Progress Notes (Signed)
Hematology and Oncology Follow Up Visit  Gregory Schneider [id number] Jan 27, 1956 60 y.o. 06/24/2016 9:42 AM   Principle Diagnosis: 60 year old with prostate cancer diagnosed in 2010. PSA was 15 and subsequently underwent a biopsy which showed a Gleason score 4 + 5 = 9 in the right and the left apex of his prostate, indicating a poorly-differentiated adenocarcinoma of the prostate. 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  Prior Therapy:  Combined androgen deprivation with Lupron and Casodex. He had a good response and then developed a rise in the PSA with castrate level testosterone.  He is S/P Bilateral simple orchiectomy done on 12/25/2012.  Provenge started on 03/03/12. Therapy Ended in 03/31/2012.   Current therapy:  Zytiga started in 10/2013.  He continues to be on Zometa given at  [facility].  Interim History:  Gregory Schneider returns today for a follow up visit. Since his last visit, he reports no major changes in his health. He reports running out of his blood pressure medication for the last few days and his blood pressure have been elevated. He does not report symptoms associated with it including headaches or blurry vision.    He continues to tolerate Zytiga without any side effects. He denied any fluid retention, dyspepsia or any GI complaints. He does not report any pelvic discomfort but does report diffuse arthralgias and myalgias and requires hydrocodone for pain control at times.  He remains active and attends to activities of daily living. He reports no major decline in his energy or performance status.   He has no headaches or dizziness. He reports no chest pain, shortness of breath, dyspnea, abdominal pain, nausea, vomiting. He denies any hematuria. He is not reporting any nausea or vomiting. Does not report any syncope. Is not reporting any chest pain palpitation orthopnea but has reported increase in his blood pressure. Does not report any  cough or hemoptysis. Does not report any epistaxis, petechiae or lymphadenopathy. His review of system is otherwise unremarkable.  Medications: Reviewed today and no changes. Current Outpatient Prescriptions  Medication Sig Dispense Refill  . abiraterone Acetate (ZYTIGA) 250 MG tablet Take 4 tablets (1,000 mg total) by mouth daily. Take on an empty stomach 1 hour before or 2 hours after a meal 120 tablet 0  . amLODipine (NORVASC) 5 MG tablet Take by mouth.    Marland Kitchen buPROPion (WELLBUTRIN SR) 150 MG 12 hr tablet Take 150 mg by mouth daily.    . calcium carbonate (OS-CAL) 600 MG TABS Take 600 mg by mouth daily.    . calcium-vitamin D (CALCIUM 500/D) 500-200 MG-UNIT per tablet Take by mouth.    . cholecalciferol (VITAMIN D) 1000 UNITS tablet Take 1,000 Units by mouth daily.    . clonazePAM (KLONOPIN) 1 MG tablet Take 1 mg by mouth as needed for anxiety (1-2 tablets daily as needed).     . Cyanocobalamin (RA VITAMIN B-12 TR) 1000 MCG TBCR Take by mouth.    Marland Kitchen HYDROcodone-acetaminophen (NORCO/VICODIN) 5-325 MG tablet Take 1-2 tablets by mouth every 6 (six) hours as needed. 60 tablet 0  . Multiple Vitamin (MULTIVITAMIN) tablet Take 1 tablet by mouth daily.    . potassium chloride SA (K-DUR,KLOR-CON) 20 MEQ tablet take 1 tablet by mouth once daily 60 tablet 0  . sertraline (ZOLOFT) 100 MG tablet Take 200 mg by mouth every morning.     . Zoledronic Acid (ZOMETA) 4 MG/100ML IVPB Inject 4 mg into the vein every 30 (thirty) days.    Marland Kitchen  ZYTIGA 250 MG tablet TAKE 4 TABLETS BY MOUTH DAILY. TAKE ON AN EMPTY STOMACH 1 HOUR BEFORE OR 2 HOURS AFTER A MEAL 120 tablet 4  . losartan-hydrochlorothiazide (HYZAAR) 100-25 MG per tablet      No current facility-administered medications for this visit.     Allergies:  Allergies  Allergen Reactions  . Penicillins Other (See Comments)    Unknown     His past medical history, social history reviewed and unchanged.  Physical Exam: Blood pressure (!) 172/93, pulse 69,  temperature 98.2 F (36.8 C), temperature source Oral, resp. rate 20, height 6\' 3"  (1.905 m), weight (!) 329 lb 14.4 oz (149.6 kg), SpO2 98 %. ECOG: 1 General appearance: Alert, awake gentleman without distress. Head: Normocephalic, without obvious abnormality no oral thrush noted. Neck: no adenopathy, no thyroid masses. Lymph nodes: Cervical, supraclavicular, and axillary nodes normal. Heart:regular rate and rhythm, S1, S2 normal, no murmur, click, rub or gallop Lung:chest clear, no wheezing, rales, normal symmetric air entry. Abdomen: soft, non-tender, without masses or organomegaly. No shifting dullness or ascites. EXT:no erythema, induration, or nodules.  Neurological examination: No motor, sensory deficits.   Lab Results: Lab Results  Component Value Date   WBC 5.6 06/24/2016   HGB 12.4 (L) 06/24/2016   HCT 35.7 (L) 06/24/2016   MCV 86.4 06/24/2016   PLT 108 (L) 06/24/2016     Chemistry      Component Value Date/Time   NA 138 03/24/2016 0925   K 2.8 (LL) 03/24/2016 0925   CL 103 01/01/2014 1213   CL 104 03/09/2013 0921   CO2 28 03/24/2016 0925   BUN 19.8 03/24/2016 0925   CREATININE 1.0 03/24/2016 0925      Component Value Date/Time   CALCIUM 9.2 03/24/2016 0925   ALKPHOS 93 03/24/2016 0925   AST 18 03/24/2016 0925   ALT 18 03/24/2016 0925   BILITOT 0.62 03/24/2016 0925      Results for Gregory, Schneider (MRN [id number]) as of 06/24/2016 09:31  Ref. Range 12/19/2015 09:37 12/19/2015 09:37 03/24/2016 09:25  PSA Latest Ref Range: 0.0 - 4.0 ng/mL 0.75 0.7 1.1        Impression and Plan: This is a 60 year old gentleman with the following issues:  1. 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  His disease includes pelvic recurrence as well as bony metastasis.  He has been on Zytiga since 10/2013 and has tolerated it well. He had an excellent response clinically with improvement in his  overall symptoms. His PSA is a slow rise in the last year or so. The plan is to continue Zytiga with the same dose and schedule and repeat imaging studies for staging purposes before the next visit. I favor continuing this medication given his excellent response.  2. Androgen deprivation.  He S/P Bilateral orchiectomy.  3. Bony disease. He continues Zometa monthly at [facility]. He will continue his calcium and vitamin D supplements.  No recent complications to this therapy noted.  4. Hypertension: His following up with primary care physician during this issue. He ran out of his medication as of late and he is about to restart tap.  5. Back pain: Reasonably controlled with hydrocodone which she takes very infrequently mostly at nighttime. Refill was given to the patient today.  6. Hypokalemia: Potassium is within normal range today.  7. Followup. In 3 months to assess for any new complications.    [facility] MD 8/10/20179:42 AM

## 2016-06-24 NOTE — Addendum Note (Signed)
Addended by: Amelia Jo I on: 06/24/2016 10:32 AM   Modules accepted: Orders

## 2016-06-25 ENCOUNTER — Telehealth: Payer: Self-pay | Admitting: *Deleted

## 2016-06-25 LAB — PSA: Prostate Specific Ag, Serum: 1.7 ng/mL (ref 0.0–4.0)

## 2016-06-25 NOTE — Telephone Encounter (Signed)
Per Dr. [name], I informed patient of his PSA level. Patient verbalized understanding. 

## 2016-07-16 DIAGNOSIS — Z6841 Body Mass Index (BMI) 40.0 and over, adult: Secondary | ICD-10-CM | POA: Diagnosis not present

## 2016-07-16 DIAGNOSIS — Z79899 Other long term (current) drug therapy: Secondary | ICD-10-CM | POA: Diagnosis not present

## 2016-07-16 DIAGNOSIS — I10 Essential (primary) hypertension: Secondary | ICD-10-CM | POA: Diagnosis not present

## 2016-07-16 DIAGNOSIS — Z1211 Encounter for screening for malignant neoplasm of colon: Secondary | ICD-10-CM | POA: Diagnosis not present

## 2016-07-16 DIAGNOSIS — Z0000 Encounter for general adult medical examination without abnormal findings: Secondary | ICD-10-CM | POA: Diagnosis not present

## 2016-07-20 ENCOUNTER — Other Ambulatory Visit: Payer: Self-pay | Admitting: *Deleted

## 2016-07-20 ENCOUNTER — Encounter: Payer: Self-pay | Admitting: *Deleted

## 2016-07-20 DIAGNOSIS — C61 Malignant neoplasm of prostate: Secondary | ICD-10-CM

## 2016-07-20 MED ORDER — ABIRATERONE ACETATE 250 MG PO TABS: 1000.0000 mg | ORAL_TABLET | Freq: Every day | ORAL | 0 refills | Status: DC

## 2016-07-30 DIAGNOSIS — Z1211 Encounter for screening for malignant neoplasm of colon: Secondary | ICD-10-CM | POA: Diagnosis not present

## 2016-08-06 DIAGNOSIS — C61 Malignant neoplasm of prostate: Secondary | ICD-10-CM | POA: Diagnosis not present

## 2016-08-06 DIAGNOSIS — M810 Age-related osteoporosis without current pathological fracture: Secondary | ICD-10-CM | POA: Diagnosis not present

## 2016-08-10 DIAGNOSIS — Z23 Encounter for immunization: Secondary | ICD-10-CM | POA: Diagnosis not present

## 2016-08-17 ENCOUNTER — Other Ambulatory Visit: Payer: Self-pay | Admitting: *Deleted

## 2016-08-17 DIAGNOSIS — C61 Malignant neoplasm of prostate: Secondary | ICD-10-CM

## 2016-08-17 MED ORDER — ABIRATERONE ACETATE 250 MG PO TABS: 1000.0000 mg | ORAL_TABLET | Freq: Every day | ORAL | 0 refills | Status: DC

## 2016-09-09 ENCOUNTER — Encounter: Payer: Self-pay | Admitting: *Deleted

## 2016-09-09 NOTE — Progress Notes (Signed)
This RN called patient and instructed him to bring in statements that include income for [employer] Patient assistance paperwork. Patient stated,"they mailed me one too, and I'm working on that." Instructed him to bring his paperwork to next weeks visit with Dr. Alen Blew and we would review the paperwork at that visit. Patient verbalized understanding.

## 2016-09-14 ENCOUNTER — Other Ambulatory Visit: Payer: Self-pay | Admitting: *Deleted

## 2016-09-14 DIAGNOSIS — C61 Malignant neoplasm of prostate: Secondary | ICD-10-CM

## 2016-09-14 MED ORDER — ABIRATERONE ACETATE 250 MG PO TABS: 1000.0000 mg | ORAL_TABLET | Freq: Every day | ORAL | 0 refills | Status: DC

## 2016-09-15 ENCOUNTER — Telehealth: Payer: Self-pay | Admitting: *Deleted

## 2016-09-15 ENCOUNTER — Other Ambulatory Visit: Payer: Medicare HMO

## 2016-09-15 NOTE — Telephone Encounter (Signed)
"  I think I have an appointment this week with Dr. Alen Blew."   Appointment information provided.  09-17-2016 beginning at 0900.

## 2016-09-17 ENCOUNTER — Other Ambulatory Visit (HOSPITAL_BASED_OUTPATIENT_CLINIC_OR_DEPARTMENT_OTHER): Payer: Medicare HMO

## 2016-09-17 ENCOUNTER — Ambulatory Visit (HOSPITAL_BASED_OUTPATIENT_CLINIC_OR_DEPARTMENT_OTHER): Payer: Medicare HMO | Admitting: Oncology

## 2016-09-17 ENCOUNTER — Telehealth: Payer: Self-pay | Admitting: Pharmacist

## 2016-09-17 VITALS — BP 143/92 | HR 75 | Temp 98.1°F | Resp 18 | Ht 75.0 in | Wt 320.4 lb

## 2016-09-17 DIAGNOSIS — C7951 Secondary malignant neoplasm of bone: Secondary | ICD-10-CM

## 2016-09-17 DIAGNOSIS — E291 Testicular hypofunction: Secondary | ICD-10-CM

## 2016-09-17 DIAGNOSIS — C61 Malignant neoplasm of prostate: Secondary | ICD-10-CM

## 2016-09-17 DIAGNOSIS — I10 Essential (primary) hypertension: Secondary | ICD-10-CM | POA: Diagnosis not present

## 2016-09-17 DIAGNOSIS — E876 Hypokalemia: Secondary | ICD-10-CM

## 2016-09-17 LAB — CBC WITH DIFFERENTIAL/PLATELET
BASO%: 0.1 % (ref 0.0–2.0)
Basophils Absolute: 0 10*3/uL (ref 0.0–0.1)
EOS%: 2.7 % (ref 0.0–7.0)
Eosinophils Absolute: 0.2 10*3/uL (ref 0.0–0.5)
HCT: 36.6 % — ABNORMAL LOW (ref 38.4–49.9)
HGB: 12.8 g/dL — ABNORMAL LOW (ref 13.0–17.1)
LYMPH%: 26 % (ref 14.0–49.0)
MCH: 29.6 pg (ref 27.2–33.4)
MCHC: 35 g/dL (ref 32.0–36.0)
MCV: 84.7 fL (ref 79.3–98.0)
MONO#: 0.2 10*3/uL (ref 0.1–0.9)
MONO%: 3.3 % (ref 0.0–14.0)
NEUT#: 5 10*3/uL (ref 1.5–6.5)
NEUT%: 67.9 % (ref 39.0–75.0)
Platelets: 129 10*3/uL — ABNORMAL LOW (ref 140–400)
RBC: 4.32 10*6/uL (ref 4.20–5.82)
RDW: 14.9 % — ABNORMAL HIGH (ref 11.0–14.6)
WBC: 7.3 10*3/uL (ref 4.0–10.3)
lymph#: 1.9 10*3/uL (ref 0.9–3.3)

## 2016-09-17 LAB — COMPREHENSIVE METABOLIC PANEL
ALT: 18 U/L (ref 0–55)
AST: 15 U/L (ref 5–34)
Albumin: 3.8 g/dL (ref 3.5–5.0)
Alkaline Phosphatase: 102 U/L (ref 40–150)
Anion Gap: 12 mEq/L — ABNORMAL HIGH (ref 3–11)
BUN: 18.5 mg/dL (ref 7.0–26.0)
CO2: 27 mEq/L (ref 22–29)
Calcium: 9.4 mg/dL (ref 8.4–10.4)
Chloride: 101 mEq/L (ref 98–109)
Creatinine: 0.8 mg/dL (ref 0.7–1.3)
EGFR: >90 mL/min/{1.73_m2} (ref >90)
Glucose: 123 mg/dl (ref 70–140)
Potassium: 3.3 mEq/L — ABNORMAL LOW (ref 3.5–5.1)
Sodium: 140 mEq/L (ref 136–145)
Total Bilirubin: 0.52 mg/dL (ref 0.20–1.20)
Total Protein: 7.2 g/dL (ref 6.4–8.3)

## 2016-09-17 MED ORDER — HYDROCODONE-ACETAMINOPHEN 5-325 MG PO TABS: 1.0000 | ORAL_TABLET | Freq: Four times a day (QID) | ORAL | 0 refills | Status: DC | PRN

## 2016-09-17 NOTE — Telephone Encounter (Signed)
Oral Chemotherapy Pharmacist Encounter  I met with patient in exam room to go over [employer] application for their patient assistance program in order to obtain free Zytiga from the manufacturer. I made a copy of patient's 2016 1040 to include with application for proof of income. Application completed. Application faxed to [employer] at [facility] will continue to follow and update patient on status of application as we here.  Johny Drilling, PharmD, BCPS 09/17/2016  10:45 AM Oral Chemotherapy Clinic [phone]

## 2016-09-17 NOTE — Progress Notes (Signed)
Hematology and Oncology Follow Up Visit  Gregory Schneider [id number] 22-Apr-1956 60 y.o. 09/17/2016 9:53 AM   Principle Diagnosis: 60 year old with prostate cancer diagnosed in 2010. PSA was 15 and subsequently underwent a biopsy which showed a Gleason score 4 + 5 = 9 in the right and the left apex of his prostate, indicating a poorly-differentiated adenocarcinoma of the prostate. 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  Prior Therapy:  Combined androgen deprivation with Lupron and Casodex. He had a good response and then developed a rise in the PSA with castrate level testosterone.  He is S/P Bilateral simple orchiectomy done on 12/25/2012.  Provenge started on 03/03/12. Therapy Ended in 03/31/2012.   Current therapy:  Zytiga started in 10/2013.  He continues to be on Zometa given at  [facility].  Interim History:  Mr. Callison returns today for a follow up visit. Since his last visit, he reports feeling well overall without any recent complaints.He continues to tolerate Zytiga without any side effects. He denied any fluid retention, dyspepsia or any GI complaints. He does not report any pelvic discomfort but does report diffuse arthralgias and myalgias and requires hydrocodone for pain control at times.  He denied any signs and symptoms of cancer progression including back pain, shoulder pain or pathological fractures. His appetite remain excellent and his performance status remains at baseline.   He has no headaches or dizziness. He reports no chest pain, shortness of breath, dyspnea, abdominal pain, nausea, vomiting. He denies any hematuria. He is not reporting any nausea or vomiting. Does not report any syncope. Is not reporting any chest pain palpitation orthopnea but has reported increase in his blood pressure. Does not report any cough or hemoptysis. Does not report any epistaxis, petechiae or lymphadenopathy. His review of system is otherwise  unremarkable.  Medications: Reviewed today and no changes. Current Outpatient Prescriptions  Medication Sig Dispense Refill  . abiraterone Acetate (ZYTIGA) 250 MG tablet Take 4 tablets (1,000 mg total) by mouth daily. Take on an empty stomach 1 hour before or 2 hours after a meal 120 tablet 0  . amLODipine (NORVASC) 5 MG tablet Take by mouth.    Marland Kitchen buPROPion (WELLBUTRIN SR) 150 MG 12 hr tablet Take 150 mg by mouth daily.    . calcium carbonate (OS-CAL) 600 MG TABS Take 600 mg by mouth daily.    . calcium-vitamin D (CALCIUM 500/D) 500-200 MG-UNIT per tablet Take by mouth.    . cholecalciferol (VITAMIN D) 1000 UNITS tablet Take 1,000 Units by mouth daily.    . clonazePAM (KLONOPIN) 1 MG tablet Take 1 mg by mouth as needed for anxiety (1-2 tablets daily as needed).     . Cyanocobalamin (RA VITAMIN B-12 TR) 1000 MCG TBCR Take by mouth.    Marland Kitchen HYDROcodone-acetaminophen (NORCO/VICODIN) 5-325 MG tablet Take 1-2 tablets by mouth every 6 (six) hours as needed. 60 tablet 0  . Multiple Vitamin (MULTIVITAMIN) tablet Take 1 tablet by mouth daily.    . potassium chloride SA (K-DUR,KLOR-CON) 20 MEQ tablet take 1 tablet by mouth once daily 60 tablet 0  . sertraline (ZOLOFT) 100 MG tablet Take 200 mg by mouth every morning.     . Zoledronic Acid (ZOMETA) 4 MG/100ML IVPB Inject 4 mg into the vein every 30 (thirty) days.    Marland Kitchen losartan-hydrochlorothiazide (HYZAAR) 100-25 MG per tablet      No current facility-administered medications for this visit.     Allergies:  Allergies  Allergen  Reactions  . Penicillins Other (See Comments)    Patient stated,"I don't remember what kind of reaction because I was a kid."    His past medical history, social history reviewed and unchanged.  Physical Exam: Blood pressure (!) 143/92, pulse 75, temperature 98.1 F (36.7 C), temperature source Oral, resp. rate 18, height 6\' 3"  (1.905 m), weight (!) 320 lb 6.4 oz (145.3 kg), SpO2 100 %. ECOG: 1 General  appearance:Well-appearing gentleman without distress. Head: Normocephalic, without obvious abnormality no oral ulcers or lesions. Neck: no adenopathy, no thyroid masses. Lymph nodes: Cervical, supraclavicular, and axillary nodes normal. Heart:regular rate and rhythm, S1, S2 normal, no murmur, click, rub or gallop Lung:chest clear, no wheezing, rales, normal symmetric air entry. Abdomen: soft, non-tender, without masses or organomegaly. No rebound or guarding.  EXT:no erythema, induration, or nodules.  Neurological examination: No motor, sensory deficits.   Lab Results: Lab Results  Component Value Date   WBC 7.3 09/17/2016   HGB 12.8 (L) 09/17/2016   HCT 36.6 (L) 09/17/2016   MCV 84.7 09/17/2016   PLT 129 (L) 09/17/2016     Chemistry      Component Value Date/Time   NA 139 06/24/2016 0908   K 3.5 06/24/2016 0908   CL 103 01/01/2014 1213   CL 104 03/09/2013 0921   CO2 29 06/24/2016 0908   BUN 15.3 06/24/2016 0908   CREATININE 0.9 06/24/2016 0908      Component Value Date/Time   CALCIUM 9.4 06/24/2016 0908   ALKPHOS 72 06/24/2016 0908   AST 16 06/24/2016 0908   ALT 15 06/24/2016 0908   BILITOT 0.90 06/24/2016 0908        Results for ELICEO, MOGUL (MRN [id number]) as of 09/17/2016 09:37  Ref. Range 12/19/2015 09:37 03/24/2016 09:25 06/24/2016 09:08  PSA Latest Ref Range: 0.0 - 4.0 ng/mL 0.7 1.1 1.7       Impression and Plan: This is a 60 year old gentleman with the following issues:  1. 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  His disease includes pelvic recurrence as well as bony metastasis.  He has been on Zytiga since 10/2013 and has tolerated it well. He had an excellent response clinically with improvement in his overall symptoms.   His PSA is a slow rise up to 1.7. I recommended continuing Zytiga at this time and repeat staging workup before the next visit. We have discussed  itself therapy Xtandi as well as systemic chemotherapy. If his disease remains relatively stable with a stable PSA (continue Zytiga.   2. Androgen deprivation.  He S/P Bilateral orchiectomy.  3. Bony disease. He continues Zometa monthly at [facility]. He will continue his calcium and vitamin D supplements.  No recent complications to this therapy noted.  4. Hypertension: His following up with primary care physician during this issue. His blood pressure seems to be under reasonable control at this time.  5. Back pain: Reasonably controlled with hydrocodone which she takes very infrequently mostly at nighttime. Refill was given to the patient today.  6. Hypokalemia: Potassium is within normal range today.  7. Followup. In 3 months after repeat staging workup.    [facility] MD 11/3/20179:53 AM

## 2016-09-18 LAB — TESTOSTERONE: Testosterone, Serum: <3 ng/dL — ABNORMAL LOW (ref 264–916)

## 2016-09-18 LAB — PSA: Prostate Specific Ag, Serum: 3.1 ng/mL (ref 0.0–4.0)

## 2016-09-20 NOTE — Progress Notes (Signed)
Spoke with patient, gave results of last PSA. No changes for now.

## 2016-09-24 DIAGNOSIS — C61 Malignant neoplasm of prostate: Secondary | ICD-10-CM | POA: Diagnosis not present

## 2016-09-24 DIAGNOSIS — C7951 Secondary malignant neoplasm of bone: Secondary | ICD-10-CM | POA: Diagnosis not present

## 2016-09-29 ENCOUNTER — Telehealth: Payer: Self-pay | Admitting: General Practice

## 2016-09-29 NOTE — Telephone Encounter (Signed)
Spoke w/pt confirmed 12/15/16 appt.

## 2016-10-13 ENCOUNTER — Other Ambulatory Visit: Payer: Self-pay | Admitting: *Deleted

## 2016-10-13 DIAGNOSIS — C61 Malignant neoplasm of prostate: Secondary | ICD-10-CM

## 2016-10-13 MED ORDER — ABIRATERONE ACETATE 250 MG PO TABS: 1000.0000 mg | ORAL_TABLET | Freq: Every day | ORAL | 0 refills | Status: DC

## 2016-10-22 DIAGNOSIS — C61 Malignant neoplasm of prostate: Secondary | ICD-10-CM | POA: Diagnosis not present

## 2016-10-22 DIAGNOSIS — C7951 Secondary malignant neoplasm of bone: Secondary | ICD-10-CM | POA: Diagnosis not present

## 2016-10-25 ENCOUNTER — Encounter: Payer: Self-pay | Admitting: Oncology

## 2016-10-25 NOTE — Progress Notes (Signed)
Returned patient's call to advise CT scan authorized, from 09/13/16 to 12/12/16. No answer, left message to call me back.

## 2016-10-29 ENCOUNTER — Telehealth: Payer: Self-pay | Admitting: Pharmacist

## 2016-10-29 NOTE — Telephone Encounter (Signed)
Spoke with Mortimer Fries at [employer]. He stated that only a partial page 1 was received from patient enrollment application faxed on [phone]. All other pages were completely received.  [employer] needs [place] to refax page 1.  They will begin reviewing applications on Jan 2, [id number].  Faxed requested information. Fax confirmation received.  Raul Del, PharmD, BCPS, BCOP Oral Chemotherapy Clinic [phone]

## 2016-11-03 DIAGNOSIS — R69 Illness, unspecified: Secondary | ICD-10-CM | POA: Diagnosis not present

## 2016-11-03 DIAGNOSIS — Z6841 Body Mass Index (BMI) 40.0 and over, adult: Secondary | ICD-10-CM | POA: Diagnosis not present

## 2016-11-03 DIAGNOSIS — C61 Malignant neoplasm of prostate: Secondary | ICD-10-CM | POA: Diagnosis not present

## 2016-11-03 DIAGNOSIS — I10 Essential (primary) hypertension: Secondary | ICD-10-CM | POA: Diagnosis not present

## 2016-11-03 DIAGNOSIS — C7951 Secondary malignant neoplasm of bone: Secondary | ICD-10-CM | POA: Diagnosis not present

## 2016-11-03 DIAGNOSIS — Z0000 Encounter for general adult medical examination without abnormal findings: Secondary | ICD-10-CM | POA: Diagnosis not present

## 2016-11-09 ENCOUNTER — Other Ambulatory Visit: Payer: Self-pay | Admitting: Nurse Practitioner

## 2016-11-17 ENCOUNTER — Ambulatory Visit (HOSPITAL_COMMUNITY)
Admission: RE | Admit: 2016-11-17 | Discharge: 2016-11-17 | Disposition: A | Discharge status: Home / Self Care | Payer: Medicare HMO | Source: Ambulatory Visit | Attending: Oncology | Admitting: Oncology

## 2016-11-17 ENCOUNTER — Telehealth: Payer: Self-pay | Admitting: Oncology

## 2016-11-17 ENCOUNTER — Encounter (HOSPITAL_COMMUNITY): Payer: Self-pay

## 2016-11-17 DIAGNOSIS — I700 Atherosclerosis of aorta: Secondary | ICD-10-CM | POA: Diagnosis not present

## 2016-11-17 DIAGNOSIS — C61 Malignant neoplasm of prostate: Secondary | ICD-10-CM

## 2016-11-17 DIAGNOSIS — C7951 Secondary malignant neoplasm of bone: Secondary | ICD-10-CM | POA: Insufficient documentation

## 2016-11-17 DIAGNOSIS — K760 Fatty (change of) liver, not elsewhere classified: Secondary | ICD-10-CM | POA: Diagnosis not present

## 2016-11-17 DIAGNOSIS — K5730 Diverticulosis of large intestine without perforation or abscess without bleeding: Secondary | ICD-10-CM | POA: Insufficient documentation

## 2016-11-17 MED ORDER — TECHNETIUM TC 99M MEDRONATE IV KIT: 25.0000 | PACK | Freq: Once | INTRAVENOUS | Status: DC | PRN

## 2016-11-17 MED ORDER — IOPAMIDOL (ISOVUE-300) INJECTION 61%
INTRAVENOUS | Status: AC
  Filled 2016-11-17: qty 100

## 2016-11-17 MED ORDER — IOPAMIDOL (ISOVUE-300) INJECTION 61%
100.0000 mL | Freq: Once | INTRAVENOUS | Status: AC | PRN
  Administered 2016-11-17: 100 mL via INTRAVENOUS

## 2016-11-17 NOTE — Telephone Encounter (Signed)
sw pt to confirm 1/4 appt time per FS LOS

## 2016-11-18 ENCOUNTER — Ambulatory Visit (HOSPITAL_BASED_OUTPATIENT_CLINIC_OR_DEPARTMENT_OTHER): Payer: Medicare HMO | Admitting: Oncology

## 2016-11-18 DIAGNOSIS — I10 Essential (primary) hypertension: Secondary | ICD-10-CM | POA: Diagnosis not present

## 2016-11-18 DIAGNOSIS — C7951 Secondary malignant neoplasm of bone: Secondary | ICD-10-CM

## 2016-11-18 DIAGNOSIS — E876 Hypokalemia: Secondary | ICD-10-CM

## 2016-11-18 DIAGNOSIS — E291 Testicular hypofunction: Secondary | ICD-10-CM | POA: Diagnosis not present

## 2016-11-18 DIAGNOSIS — C61 Malignant neoplasm of prostate: Secondary | ICD-10-CM

## 2016-11-18 MED ORDER — ENZALUTAMIDE 40 MG PO CAPS: 160.0000 mg | ORAL_CAPSULE | Freq: Every day | ORAL | 0 refills | Status: DC

## 2016-11-18 MED ORDER — HYDROCODONE-ACETAMINOPHEN 5-325 MG PO TABS: 1.0000 | ORAL_TABLET | Freq: Four times a day (QID) | ORAL | 0 refills | Status: DC | PRN

## 2016-11-18 NOTE — Progress Notes (Signed)
Patient given educational information on Xtandi and hard copy of script given to [employer] oral chemo navigator for financial assistance.

## 2016-11-18 NOTE — Progress Notes (Signed)
Hematology and Oncology Follow Up Visit  Gregory Schneider [id number] 1956-06-04 61 y.o. 11/18/2016 3:40 PM   Principle Diagnosis: 61 year old with prostate cancer diagnosed in 2010. PSA was 15 and subsequently underwent a biopsy which showed a Gleason score 4 + 5 = 9 in the right and the left apex of his prostate, indicating a poorly-differentiated adenocarcinoma of the prostate. 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  Prior Therapy:  Combined androgen deprivation with Lupron and Casodex. He had a good response and then developed a rise in the PSA with castrate level testosterone.  He is S/P Bilateral simple orchiectomy done on 12/25/2012.  Provenge started on 03/03/12. Therapy Ended in 03/31/2012.  Zytiga started in 10/2013. Therapy discontinued in January 2018 because of progression of disease.  Current therapy:   He continues to be on Zometa given at  [facility].  Interim History:  Gregory Schneider returns today for a follow up visit. Since his last visit, he reports increasing urinary symptoms. He is reporting frequency of urination, dysuria and pelvic discomfort. He reported similar symptoms 3 years ago when he developed progression of disease and a rise in his PSA. He denies any hematuria or dysuria. He denied any bone pain or pathological fractures. His pelvic pain does require hydrocodone which she has been taking periodically.  Gregory KitchenHe continues to tolerate Zytiga without any side effects. He denied any fluid retention, dyspepsia or any GI complaints. He denies missing any doses or compliance issues.    He has no headaches or dizziness. He reports no chest pain, shortness of breath, dyspnea, abdominal pain, nausea, vomiting. He denies any hematuria. He is not reporting any nausea or vomiting. Does not report any syncope. Is not reporting any chest pain palpitation orthopnea but has reported increase in his blood pressure. Does not report any cough or  hemoptysis. Does not report any epistaxis, petechiae or lymphadenopathy. His review of system is otherwise unremarkable.  Medications: Reviewed today and no changes. Current Outpatient Prescriptions  Medication Sig Dispense Refill  . amLODipine (NORVASC) 5 MG tablet Take by mouth.    Gregory Schneider buPROPion (WELLBUTRIN SR) 150 MG 12 hr tablet Take 150 mg by mouth daily.    . calcium carbonate (OS-CAL) 600 MG TABS Take 600 mg by mouth daily.    . calcium-vitamin D (CALCIUM 500/D) 500-200 MG-UNIT per tablet Take by mouth.    . cholecalciferol (VITAMIN D) 1000 UNITS tablet Take 1,000 Units by mouth daily.    . clonazePAM (KLONOPIN) 1 MG tablet Take 1 mg by mouth as needed for anxiety (1-2 tablets daily as needed).     . Cyanocobalamin (RA VITAMIN B-12 TR) 1000 MCG TBCR Take by mouth.    Gregory Schneider HYDROcodone-acetaminophen (NORCO/VICODIN) 5-325 MG tablet Take 1-2 tablets by mouth every 6 (six) hours as needed. 60 tablet 0  . losartan-hydrochlorothiazide (HYZAAR) 100-25 MG tablet   0  . Multiple Vitamin (MULTIVITAMIN) tablet Take 1 tablet by mouth daily.    . potassium chloride SA (K-DUR,KLOR-CON) 20 MEQ tablet take 1 tablet by mouth once daily 60 tablet 0  . sertraline (ZOLOFT) 100 MG tablet Take 200 mg by mouth every morning.     . Zoledronic Acid (ZOMETA) 4 MG/100ML IVPB Inject 4 mg into the vein every 30 (thirty) days.    . enzalutamide (XTANDI) 40 MG capsule Take 4 capsules (160 mg total) by mouth daily. 120 capsule 0  . losartan-hydrochlorothiazide (HYZAAR) 100-25 MG per tablet  No current facility-administered medications for this visit.    Facility-Administered Medications Ordered in Other Visits  Medication Dose Route Frequency Provider Last Rate Last Dose  . technetium medronate (TC-MDP) injection 25 millicurie  25 millicurie Intravenous Once PRN Gaspar Cola, MD        Allergies:  Allergies  Allergen Reactions  . Penicillins Other (See Comments)    Patient stated,"I don't remember what kind of  reaction because I was a kid."    His past medical history, social history reviewed and unchanged.  Physical Exam:  Vitals persuade reviewed today showed a blood pressure 164/92 with a pulse of 79. Temperature is 98.7 and oxygen saturation of 98%. His 319.7 pounds.  ECOG: 1 General appearance: Alert, awake gentleman without distress. Head: Normocephalic, without obvious abnormality no oral thrush noted. Neck: no adenopathy, no thyroid masses. Lymph nodes: Cervical, supraclavicular, and axillary nodes normal. Heart:regular rate and rhythm, S1, S2 normal, no murmur, click, rub or gallop Lung:chest clear, no wheezing, rales, normal symmetric air entry. Abdomen: soft, non-tender, without masses or organomegaly. Shifting dullness or ascites. EXT:no erythema, induration, or nodules.  Neurological examination: No motor, sensory deficits.   Lab Results: Lab Results  Component Value Date   WBC 7.3 09/17/2016   HGB 12.8 (L) 09/17/2016   HCT 36.6 (L) 09/17/2016   MCV 84.7 09/17/2016   PLT 129 (L) 09/17/2016     Chemistry      Component Value Date/Time   NA 140 09/17/2016 0908   K 3.3 (L) 09/17/2016 0908   CL 103 01/01/2014 1213   CL 104 03/09/2013 0921   CO2 27 09/17/2016 0908   BUN 18.5 09/17/2016 0908   CREATININE 0.8 09/17/2016 0908      Component Value Date/Time   CALCIUM 9.4 09/17/2016 0908   ALKPHOS 102 09/17/2016 0908   AST 15 09/17/2016 0908   ALT 18 09/17/2016 0908   BILITOT 0.52 09/17/2016 0908     EXAM: CT ABDOMEN AND PELVIS WITH CONTRAST  TECHNIQUE: Multidetector CT imaging of the abdomen and pelvis was performed using the standard protocol following bolus administration of intravenous contrast.  CONTRAST:  113mL ISOVUE-300 IOPAMIDOL (ISOVUE-300) INJECTION 61%  COMPARISON:  CT the abdomen and pelvis 10/08/2013.  FINDINGS: Lower chest: Unremarkable.  Hepatobiliary: Diffuse low attenuation throughout the hepatic parenchyma, compatible with a  background of hepatic steatosis. There are multiple well-defined low-attenuation lesions in the liver, compatible with simple cysts, largest of which measure up to 1.2 cm in segment 6. No suspicious hepatic lesions are identified. No intra or extrahepatic biliary ductal dilatation. Gallbladder is normal in appearance.  Pancreas: No pancreatic mass. No pancreatic ductal dilatation. No pancreatic or peripancreatic fluid or inflammatory changes.  Spleen: Unremarkable.  Adrenals/Urinary Tract: Bilateral adrenal glands and bilateral kidneys are normal in appearance. There is no hydroureteronephrosis or perinephric stranding to suggest urinary tract obstruction at this time. Urinary bladder wall is markedly thickened, without a discrete bladder mass.  Stomach/Bowel: The appearance of the stomach is normal. There is no pathologic dilatation of small bowel or colon. Numerous colonic diverticulae are noted, without surrounding inflammatory changes to suggest an acute diverticulitis at this time. The appendix is not confidently identified and may be surgically absent. Regardless, there are no inflammatory changes noted adjacent to the cecum to suggest the presence of an acute appendicitis at this time.  Vascular/Lymphatic: Aortic atherosclerosis, without evidence of aneurysm or dissection in the abdominal or pelvic vasculature. No lymphadenopathy noted in the abdomen or pelvis.  Reproductive: Prostate  gland and seminal vesicles are unremarkable in appearance.  Other: No significant volume of ascites. No pneumoperitoneum. Small left inguinal hernia containing only fat.  Musculoskeletal: Multiple with sclerotic lesions are again noted throughout the visualized axial and appendicular skeleton. These generally appear increased in size and number compared to the prior examination. The best example of this is at the level of T12 where nearly the entire vertebral body is involved on  today's examination, as compared with approximately half of the vertebral body on prior study from 10/08/2013. Similar progression is also noted at S1.  IMPRESSION: 1. Progression of widespread metastatic disease to the bones, as above. 2. No extraskeletal metastatic disease identified in the abdomen or pelvis. 3. Markedly thickened urinary bladder wall. This could indicate chronic bladder outlet obstruction. 4. Colonic diverticulosis without evidence of acute diverticulitis at this time. 5. Hepatic steatosis. 6. Aortic atherosclerosis. 7. Additional incidental findings, as above.     EXAM: NUCLEAR MEDICINE WHOLE BODY BONE SCAN  TECHNIQUE: Whole body anterior and posterior images were obtained approximately 3 hours after intravenous injection of radiopharmaceutical.  RADIOPHARMACEUTICALS:  20.0 mCi Technetium-102m MDP IV  COMPARISON:  Bone scans dated 12/17/2011 and 02/23/2011 and 12/23/2008  FINDINGS: There are new metastatic lesions in the right side of the distal sternum and in the left first rib. There is increased activity in the metastasis in the head of the left clavicle at the inferior aspect of the right sacroiliac joint.  There are old unchanged metastatic lesions in both shoulders, in the head of the right clavicle and in the anterior aspect of the left sixth rib.  There is diminished activity in the metastases in the lower thoracic spine and at the tip of the right scapula.  Activity in both feet is felt to be degenerative in origin.  IMPRESSION: New metastatic lesions in the sternum and left first rib. Increased activity at the left clavicular head.  The other lesions are  stable to decreased.     Impression and Plan:    61 year old gentleman with the following issues:  1. 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  His disease includes pelvic  recurrence as well as bony metastasis.  He has been on Zytiga since 10/2013 and has tolerated it well. He had an excellent response clinically with improvement in his overall symptoms.   His PSA continues to rise and most recently up to 3.1 with a rapid doubling time. CT scan and bone scan obtained on 11/17/2016 were reviewed and showed slight progression of disease. He is exhibiting signs and symptoms of clinical progression at warrants different treatment.  Options of therapy were reviewed today which include Xtandi, systemic chemotherapy and possible local radiation therapy to the pelvis. After discussion today he opted to proceed with Xtandi and reserve radiation therapy and chemotherapy to a later date. Risks and benefits associated with this medication were reviewed today. These complications will include fatigue, tiredness, lower extremity edema and rarely seizures. He is agreeable to proceed at this time and we'll recheck his PSA in 4 weeks.  2. Androgen deprivation.  He S/P Bilateral orchiectomy.  3. Bony disease. He continues Zometa monthly at [facility] u[facility]. He will continue his calcium and vitamin D supplements.  No recent complications to this therapy noted.  4. Hypertension: His following up with primary care physician during this issue. I anticipate improvement of Zytiga.  5. Back pain: Reasonably controlled with hydrocodone which she takes very  infrequently mostly at nighttime. Refill was given to the patient today.  6. Hypokalemia: Potassium dramatically changed from previous measurements.  7. Followup. In one month to assess his response and tolerance to [place].    [facility] MD 1/4/20183:40 PM

## 2016-11-18 NOTE — Progress Notes (Signed)
Spoke with patient. He will be here today @ 3:30 for appt with dr Alen Blew.

## 2016-11-19 ENCOUNTER — Telehealth: Payer: Self-pay | Admitting: Pharmacist

## 2016-11-19 NOTE — Telephone Encounter (Signed)
Oral Chemotherapy Pharmacist Encounter  Received new prescription for [facility] for patient for metastatic prostate cancer. Labs from 09/17/16 reviewed, OK for treatment  Current medication list in Epic assessed, some DDIs with Gillermina Phy identified:  Xtandi and amlodipine: Category D interaction: Gillermina Phy is an inducer of the enzyme responsible for amlodipine metabolism (CYP3A4), which may lead to decreased exposure to the amlodipine and less efficacious blood pressure control. This will be monitored.  Gillermina Phy and Hyzaar: Category D interaction: Gillermina Phy is an inducer of 2 enzymes responsible for losartan metabolism (CYP3A4 and CYP2C9), which may lead to decreased exposure to the losartan and less efficacious blood pressure control. This will be monitored.  No change to therapy is indicated at this time. Patient does have high BPs noted in Epic, BP will be monitored at each clinic visit, and changes to medication therapy will be initiated if necessary.   Xtandi and sertraline: Category C interaction: Gillermina Phy is an inducer of the enzyme responsible for sertraline metabolism (CYP3A4), which may lead to decreased exposure to the sertraline and less efficacious symptom control. This will be monitored.  Prescription will be sent to [facility] for benefits analysis and prescription processing.   Oral Oncology Clinic will continue to follow.  Johny Drilling, PharmD, BCPS, BCOP 11/19/2016  4:39 PM Oral Oncology Clinic [phone]

## 2016-11-19 NOTE — Telephone Encounter (Signed)
Oral Chemotherapy Pharmacist Encounter  Received notification that patient is switching off of Zytiga therapy. We will cancel his PAP application to [employer].  Gregory Schneider, PharmD, BCPS, BCOP 11/19/2016  2:22 PM Oral Oncology Clinic [phone]

## 2016-11-23 ENCOUNTER — Telehealth: Payer: Self-pay

## 2016-11-23 NOTE — Telephone Encounter (Signed)
Patients copay thru [facility] was $2901.97  Enrolled in Patient Day  Approved for $6500 until 11/23/2017  ID:  [id number] BIN: [id number] PCN: [id number] Group: [id number]  Notified Mr Lao and [facility] as to this information.  Thank you Henreitta Leber, PharmD

## 2016-11-23 NOTE — Telephone Encounter (Signed)
11/23/16  Received notification from [facility] prior authorization needed for Xtandi.  PA sent thru CoverMyMeds to [place].  Received approval for Xtandi from 11/14/16 to 11/14/17.  Approval info sent to [facility] to rerun and determine patient copay.  Awaiting copay information, will follow-up 11/24/16.  Thank you  Henreitta Leber, PharmD  Oral Oncology [facility]

## 2016-11-26 DIAGNOSIS — C7951 Secondary malignant neoplasm of bone: Secondary | ICD-10-CM | POA: Diagnosis not present

## 2016-11-26 DIAGNOSIS — C61 Malignant neoplasm of prostate: Secondary | ICD-10-CM | POA: Diagnosis not present

## 2016-11-30 MED FILL — XTANDI 40 MG CAPSULE: 40 | 30 days supply | Qty: 120 | Fill #0

## 2016-12-12 ENCOUNTER — Telehealth: Payer: Self-pay | Admitting: Oncology

## 2016-12-12 NOTE — Telephone Encounter (Signed)
Lvm advising appt chg from 1/31 + 2/2 to 3/1 lab @ 1.15 and 3/7 md at 1.15 due to md CME.

## 2016-12-15 ENCOUNTER — Other Ambulatory Visit: Payer: Medicare HMO

## 2016-12-17 ENCOUNTER — Ambulatory Visit: Payer: Medicare HMO | Admitting: Oncology

## 2016-12-17 ENCOUNTER — Encounter: Payer: Self-pay | Admitting: *Deleted

## 2016-12-22 ENCOUNTER — Other Ambulatory Visit: Payer: Self-pay | Admitting: Oncology

## 2016-12-23 MED FILL — XTANDI 40 MG CAPSULE: 40 | 30 days supply | Qty: 120 | Fill #0

## 2016-12-30 DIAGNOSIS — C61 Malignant neoplasm of prostate: Secondary | ICD-10-CM | POA: Diagnosis not present

## 2016-12-30 DIAGNOSIS — C7951 Secondary malignant neoplasm of bone: Secondary | ICD-10-CM | POA: Diagnosis not present

## 2017-01-13 ENCOUNTER — Other Ambulatory Visit (HOSPITAL_BASED_OUTPATIENT_CLINIC_OR_DEPARTMENT_OTHER): Payer: Medicare HMO

## 2017-01-13 DIAGNOSIS — C61 Malignant neoplasm of prostate: Secondary | ICD-10-CM

## 2017-01-13 LAB — COMPREHENSIVE METABOLIC PANEL
ALT: 22 U/L (ref 0–55)
AST: 18 U/L (ref 5–34)
Albumin: 4.1 g/dL (ref 3.5–5.0)
Alkaline Phosphatase: 97 U/L (ref 40–150)
Anion Gap: 8 mEq/L (ref 3–11)
BUN: 23.2 mg/dL (ref 7.0–26.0)
CO2: 27 mEq/L (ref 22–29)
Calcium: 9.9 mg/dL (ref 8.4–10.4)
Chloride: 100 mEq/L (ref 98–109)
Creatinine: 0.8 mg/dL (ref 0.7–1.3)
EGFR: >90 mL/min/{1.73_m2} (ref >90)
Glucose: 101 mg/dl (ref 70–140)
Potassium: 4.2 mEq/L (ref 3.5–5.1)
Sodium: 135 mEq/L — ABNORMAL LOW (ref 136–145)
Total Bilirubin: 0.35 mg/dL (ref 0.20–1.20)
Total Protein: 7.6 g/dL (ref 6.4–8.3)

## 2017-01-13 LAB — CBC WITH DIFFERENTIAL/PLATELET
BASO%: 0.8 % (ref 0.0–2.0)
Basophils Absolute: 0.1 10*3/uL (ref 0.0–0.1)
EOS%: 2.6 % (ref 0.0–7.0)
Eosinophils Absolute: 0.2 10*3/uL (ref 0.0–0.5)
HCT: 39.5 % (ref 38.4–49.9)
HGB: 13.8 g/dL (ref 13.0–17.1)
LYMPH%: 24.1 % (ref 14.0–49.0)
MCH: 30 pg (ref 27.2–33.4)
MCHC: 34.9 g/dL (ref 32.0–36.0)
MCV: 85.9 fL (ref 79.3–98.0)
MONO#: 0.5 10*3/uL (ref 0.1–0.9)
MONO%: 6.4 % (ref 0.0–14.0)
NEUT#: 4.8 10*3/uL (ref 1.5–6.5)
NEUT%: 66.1 % (ref 39.0–75.0)
Platelets: 137 10*3/uL — ABNORMAL LOW (ref 140–400)
RBC: 4.6 10*6/uL (ref 4.20–5.82)
RDW: 14.6 % (ref 11.0–14.6)
WBC: 7.3 10*3/uL (ref 4.0–10.3)
lymph#: 1.7 10*3/uL (ref 0.9–3.3)

## 2017-01-14 LAB — PSA: Prostate Specific Ag, Serum: 0.5 ng/mL (ref 0.0–4.0)

## 2017-01-19 ENCOUNTER — Telehealth: Payer: Self-pay | Admitting: Oncology

## 2017-01-19 ENCOUNTER — Ambulatory Visit (HOSPITAL_BASED_OUTPATIENT_CLINIC_OR_DEPARTMENT_OTHER): Payer: Medicare HMO | Admitting: Oncology

## 2017-01-19 DIAGNOSIS — C7951 Secondary malignant neoplasm of bone: Secondary | ICD-10-CM

## 2017-01-19 DIAGNOSIS — M549 Dorsalgia, unspecified: Secondary | ICD-10-CM

## 2017-01-19 DIAGNOSIS — E876 Hypokalemia: Secondary | ICD-10-CM

## 2017-01-19 DIAGNOSIS — I10 Essential (primary) hypertension: Secondary | ICD-10-CM

## 2017-01-19 DIAGNOSIS — C61 Malignant neoplasm of prostate: Secondary | ICD-10-CM | POA: Diagnosis not present

## 2017-01-19 MED ORDER — HYDROCODONE-ACETAMINOPHEN 5-325 MG PO TABS: 1.0000 | ORAL_TABLET | Freq: Four times a day (QID) | ORAL | 0 refills | Status: DC | PRN

## 2017-01-19 NOTE — Telephone Encounter (Signed)
Gave patient avs report and appointments for May. Dates per patient due to out of town 5/1 and 5/3.

## 2017-01-19 NOTE — Progress Notes (Signed)
Hematology and Oncology Follow Up Visit  Gregory Schneider [id number] July 11, 1956 61 y.o. 01/19/2017 1:40 PM   Principle Diagnosis: 61 year old with prostate cancer diagnosed in 2010. PSA was 15 and subsequently underwent a biopsy which showed a Gleason score 4 + 5 = 9 in the right and the left apex of his prostate, indicating a poorly-differentiated adenocarcinoma of the prostate. 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  Prior Therapy:  Combined androgen deprivation with Lupron and Casodex. He had a good response and then developed a rise in the PSA with castrate level testosterone.  He is S/P Bilateral simple orchiectomy done on 12/25/2012.  Provenge started on 03/03/12. Therapy Ended in 03/31/2012.  Zytiga started in 10/2013. Therapy discontinued in January 2018 because of progression of disease.  Current therapy:  Xtandi 160 mg daily started in February 2018. He continues to be on Zometa given at  [facility].  Interim History:  Gregory Schneider returns today for a follow up visit. Since his last visit, he started [place] and if tolerated it well. He denied any complications related to this medication including fatigue, tiredness or GI toxicity. He reports his pelvic fullness and discomfort has improved dramatically in the last month. He is reporting frequency of urination but he denies any hematuria or dysuria. He denied any bone pain or pathological fractures. His pelvic pain does require hydrocodone which she has been taking periodically.  His quality of life and performance status not dramatically different. He continues to attend to her activities of daily living without any hindrance or decline. He is able to drive and eat reasonably well.    He does not report any headaches, blurry vision, syncope or seizures. He does not report any fevers, chills, sweats or weight loss. He does not report any cough, wheezing or hemoptysis. Not report any chest pain,  palpitation orthopnea. He is not reporting any nausea or vomiting.  Does not report any epistaxis, petechiae or lymphadenopathy. His review of system is otherwise unremarkable.  Medications: Reviewed today and no changes. Current Outpatient Prescriptions  Medication Sig Dispense Refill  . amLODipine (NORVASC) 5 MG tablet Take by mouth.    Marland Kitchen buPROPion (WELLBUTRIN SR) 150 MG 12 hr tablet Take 150 mg by mouth daily.    . calcium carbonate (OS-CAL) 600 MG TABS Take 600 mg by mouth daily.    . calcium-vitamin D (CALCIUM 500/D) 500-200 MG-UNIT per tablet Take by mouth.    . cholecalciferol (VITAMIN D) 1000 UNITS tablet Take 1,000 Units by mouth daily.    . clonazePAM (KLONOPIN) 1 MG tablet Take 1 mg by mouth as needed for anxiety (1-2 tablets daily as needed).     . Cyanocobalamin (RA VITAMIN B-12 TR) 1000 MCG TBCR Take by mouth.    Marland Kitchen HYDROcodone-acetaminophen (NORCO/VICODIN) 5-325 MG tablet Take 1-2 tablets by mouth every 6 (six) hours as needed. 60 tablet 0  . losartan-hydrochlorothiazide (HYZAAR) 100-25 MG per tablet     . losartan-hydrochlorothiazide (HYZAAR) 100-25 MG tablet   0  . Multiple Vitamin (MULTIVITAMIN) tablet Take 1 tablet by mouth daily.    . potassium chloride SA (K-DUR,KLOR-CON) 20 MEQ tablet take 1 tablet by mouth once daily 60 tablet 0  . sertraline (ZOLOFT) 100 MG tablet Take 200 mg by mouth every morning.     Gregory Schneider 40 MG capsule TAKE 4 CAPSULES BY MOUTH DAILY 120 capsule 0  . Zoledronic Acid (ZOMETA) 4 MG/100ML IVPB Inject 4 mg into the vein every  30 (thirty) days.     No current facility-administered medications for this visit.     Allergies:  Allergies  Allergen Reactions  . Penicillins Other (See Comments)    Patient stated,"I don't remember what kind of reaction because I was a kid."    His past medical history, social history reviewed and unchanged.  Physical Exam: Blood pressure (!) 156/85, pulse 89, temperature 98.3 F (36.8 C), temperature source Oral,  resp. rate 19, height 6\' 3"  (1.905 m), weight (!) 327 lb 6.4 oz (148.5 kg), SpO2 99 %.    ECOG: 1 General appearance: Alert, awake gentleman appeared without distress. Head: Normocephalic, without obvious abnormality no oral ulcers or lesions. Neck: no adenopathy, no thyroid masses. Lymph nodes: Cervical, supraclavicular, and axillary nodes normal. Heart:regular rate and rhythm, S1, S2 normal, no murmur, click, rub or gallop Lung:chest clear, no wheezing, rales, normal symmetric air entry. Abdomen: soft, non-tender, without masses or organomegaly. No rebound or guarding. EXT:no erythema, induration, or nodules.  Neurological examination: No motor, sensory deficits.   Lab Results: Lab Results  Component Value Date   WBC 7.3 01/13/2017   HGB 13.8 01/13/2017   HCT 39.5 01/13/2017   MCV 85.9 01/13/2017   PLT 137 (L) 01/13/2017     Chemistry      Component Value Date/Time   NA 135 (L) 01/13/2017 1312   K 4.2 01/13/2017 1312   CL 103 01/01/2014 1213   CL 104 03/09/2013 0921   CO2 27 01/13/2017 1312   BUN 23.2 01/13/2017 1312   CREATININE 0.8 01/13/2017 1312      Component Value Date/Time   CALCIUM 9.9 01/13/2017 1312   ALKPHOS 97 01/13/2017 1312   AST 18 01/13/2017 1312   ALT 22 01/13/2017 1312   BILITOT 0.35 01/13/2017 1312         Impression and Plan:    61 year old gentleman with the following issues:  1. 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  His disease includes pelvic recurrence as well as bony metastasis.  He he was started 10/2013 after PSA rise and symptomatic progression. He had an excellent response up until 2018. He developed pelvic pain and a rise in his PSA up to 3.1. Bone scan at that time showed also progression of his bony metastasis.  He is currently on Gregory Schneider has started in February 2018 and have tolerated it well. He had an excellent clinical response with  decrease in his pelvic pain and his PSA. His last PSA was down to 0.5 on 01/13/2017.  Risks and benefits of continuing this medication were reviewed and he is agreeable to continue.  2. Androgen deprivation.  He S/P Bilateral orchiectomy.  3. Bony disease. He continues Zometa monthly at [facility]. He will continue his calcium and vitamin D supplements.  No recent complications to this therapy noted.  4. Hypertension: His following up with primary care physician during this issue. Blood pressure appears to be at baseline.  5. Back pain: Reasonably controlled with hydrocodone which she takes very infrequently mostly at nighttime. This was refilled for him today.  6. Hypokalemia: This has normalized off Zytiga.  7. Followup. In 2 months to follow his progress.    [facility] MD 3/7/20181:40 PM

## 2017-01-28 ENCOUNTER — Other Ambulatory Visit: Payer: Self-pay | Admitting: Oncology

## 2017-01-28 DIAGNOSIS — C7951 Secondary malignant neoplasm of bone: Secondary | ICD-10-CM | POA: Diagnosis not present

## 2017-01-28 DIAGNOSIS — C61 Malignant neoplasm of prostate: Secondary | ICD-10-CM | POA: Diagnosis not present

## 2017-01-28 MED FILL — XTANDI 40 MG CAPSULE: 40 | 30 days supply | Qty: 120 | Fill #0

## 2017-02-08 DIAGNOSIS — C61 Malignant neoplasm of prostate: Secondary | ICD-10-CM | POA: Diagnosis not present

## 2017-02-08 DIAGNOSIS — R69 Illness, unspecified: Secondary | ICD-10-CM | POA: Diagnosis not present

## 2017-02-08 DIAGNOSIS — Z79899 Other long term (current) drug therapy: Secondary | ICD-10-CM | POA: Diagnosis not present

## 2017-02-08 DIAGNOSIS — I10 Essential (primary) hypertension: Secondary | ICD-10-CM | POA: Diagnosis not present

## 2017-02-28 ENCOUNTER — Other Ambulatory Visit: Payer: Self-pay | Admitting: Oncology

## 2017-02-28 MED FILL — XTANDI 40 MG CAPSULE: 40 | 30 days supply | Qty: 120 | Fill #0

## 2017-03-04 DIAGNOSIS — C7951 Secondary malignant neoplasm of bone: Secondary | ICD-10-CM | POA: Diagnosis not present

## 2017-03-04 DIAGNOSIS — C61 Malignant neoplasm of prostate: Secondary | ICD-10-CM | POA: Diagnosis not present

## 2017-03-22 ENCOUNTER — Other Ambulatory Visit (HOSPITAL_BASED_OUTPATIENT_CLINIC_OR_DEPARTMENT_OTHER): Payer: Medicare HMO

## 2017-03-22 DIAGNOSIS — C61 Malignant neoplasm of prostate: Secondary | ICD-10-CM | POA: Diagnosis not present

## 2017-03-22 DIAGNOSIS — C7951 Secondary malignant neoplasm of bone: Secondary | ICD-10-CM

## 2017-03-22 LAB — CBC WITH DIFFERENTIAL/PLATELET
BASO%: 0.6 % (ref 0.0–2.0)
Basophils Absolute: 0 10*3/uL (ref 0.0–0.1)
EOS%: 2.7 % (ref 0.0–7.0)
Eosinophils Absolute: 0.2 10*3/uL (ref 0.0–0.5)
HCT: 38.1 % — ABNORMAL LOW (ref 38.4–49.9)
HGB: 13.5 g/dL (ref 13.0–17.1)
LYMPH%: 27.9 % (ref 14.0–49.0)
MCH: 30.3 pg (ref 27.2–33.4)
MCHC: 35.4 g/dL (ref 32.0–36.0)
MCV: 85.7 fL (ref 79.3–98.0)
MONO#: 0.3 10*3/uL (ref 0.1–0.9)
MONO%: 5.1 % (ref 0.0–14.0)
NEUT#: 4.1 10*3/uL (ref 1.5–6.5)
NEUT%: 63.7 % (ref 39.0–75.0)
Platelets: 133 10*3/uL — ABNORMAL LOW (ref 140–400)
RBC: 4.45 10*6/uL (ref 4.20–5.82)
RDW: 14.4 % (ref 11.0–14.6)
WBC: 6.4 10*3/uL (ref 4.0–10.3)
lymph#: 1.8 10*3/uL (ref 0.9–3.3)

## 2017-03-22 LAB — COMPREHENSIVE METABOLIC PANEL
ALT: 28 U/L (ref 0–55)
AST: 18 U/L (ref 5–34)
Albumin: 3.9 g/dL (ref 3.5–5.0)
Alkaline Phosphatase: 74 U/L (ref 40–150)
Anion Gap: 11 mEq/L (ref 3–11)
BUN: 20 mg/dL (ref 7.0–26.0)
CO2: 26 mEq/L (ref 22–29)
Calcium: 9.1 mg/dL (ref 8.4–10.4)
Chloride: 100 mEq/L (ref 98–109)
Creatinine: 0.8 mg/dL (ref 0.7–1.3)
EGFR: >90 mL/min/{1.73_m2} (ref >90)
Glucose: 111 mg/dl (ref 70–140)
Potassium: 3.3 mEq/L — ABNORMAL LOW (ref 3.5–5.1)
Sodium: 137 mEq/L (ref 136–145)
Total Bilirubin: 0.42 mg/dL (ref 0.20–1.20)
Total Protein: 7.4 g/dL (ref 6.4–8.3)

## 2017-03-23 LAB — PSA: Prostate Specific Ag, Serum: 0.4 ng/mL (ref 0.0–4.0)

## 2017-03-24 ENCOUNTER — Telehealth: Payer: Self-pay | Admitting: Oncology

## 2017-03-24 ENCOUNTER — Ambulatory Visit (HOSPITAL_BASED_OUTPATIENT_CLINIC_OR_DEPARTMENT_OTHER): Payer: Medicare HMO | Admitting: Oncology

## 2017-03-24 VITALS — BP 184/95 | HR 77 | Temp 97.8°F | Resp 18 | Wt 341.1 lb

## 2017-03-24 DIAGNOSIS — C7951 Secondary malignant neoplasm of bone: Secondary | ICD-10-CM

## 2017-03-24 DIAGNOSIS — C61 Malignant neoplasm of prostate: Secondary | ICD-10-CM

## 2017-03-24 DIAGNOSIS — I10 Essential (primary) hypertension: Secondary | ICD-10-CM | POA: Diagnosis not present

## 2017-03-24 MED ORDER — HYDROCODONE-ACETAMINOPHEN 5-325 MG PO TABS: 1.0000 | ORAL_TABLET | Freq: Four times a day (QID) | ORAL | 0 refills | Status: DC | PRN

## 2017-03-24 NOTE — Telephone Encounter (Signed)
Gave patient AVS and calender per 5/10-  lab and f/u 7/18.

## 2017-03-24 NOTE — Progress Notes (Signed)
Hematology and Oncology Follow Up Visit  Gregory Schneider [id number] [birth year] 61 y.o. 03/24/2017 9:56 AM   Principle Diagnosis: 61 year old with prostate cancer diagnosed in 2010. PSA was 15 and subsequently underwent a biopsy which showed a Gleason score 4 + 5 = 9 in the right and the left apex of his prostate, indicating a poorly-differentiated adenocarcinoma of the prostate. 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  Prior Therapy:  Combined androgen deprivation with Lupron and Casodex. He had a good response and then developed a rise in the PSA with castrate level testosterone.  He is S/P Bilateral simple orchiectomy done on 12/25/2012.  Provenge started on 03/03/12. Therapy Ended in 03/31/2012.  Zytiga started in 10/2013. Therapy discontinued in January 2018 because of progression of disease.  Current therapy:  Xtandi 160 mg daily started in February 2018. He continues to be on Zometa given at  [facility].  Interim History:  Gregory Schneider returns today for a follow up visit. Since his last visit, he reports no major changes in his health. He continues to have excellent appetite and has gained more weight since last visit. He continues to take Xtandi and tolerated it well. He denied any complications related to this medication including fatigue, tiredness or GI toxicity. He reports his pelvic fullness and discomfort has nearly resolved. He is reporting frequency of urination but he denies any hematuria or dysuria. He denied any bone pain or pathological fractures.   His pelvic pain does require hydrocodone which she has been taking periodically. He denied any excessive use of this pain medication at this time. His quality of life remain unchanged.    He does not report any headaches, blurry vision, syncope or seizures. He does not report any fevers, chills, sweats or weight loss. He does not report any cough, wheezing or hemoptysis. Not report any chest  pain, palpitation orthopnea. He is not reporting any nausea or vomiting.  Does not report any epistaxis, petechiae or lymphadenopathy. His review of system is otherwise unremarkable.  Medications: Reviewed today and no changes. Current Outpatient Prescriptions  Medication Sig Dispense Refill  . amLODipine (NORVASC) 5 MG tablet Take by mouth.    Marland Kitchen buPROPion (WELLBUTRIN SR) 150 MG 12 hr tablet Take 150 mg by mouth daily.    . calcium carbonate (OS-CAL) 600 MG TABS Take 600 mg by mouth daily.    . calcium-vitamin D (CALCIUM 500/D) 500-200 MG-UNIT per tablet Take by mouth.    . cholecalciferol (VITAMIN D) 1000 UNITS tablet Take 1,000 Units by mouth daily.    . clonazePAM (KLONOPIN) 1 MG tablet Take 1 mg by mouth as needed for anxiety (1-2 tablets daily as needed).     . Cyanocobalamin (RA VITAMIN B-12 TR) 1000 MCG TBCR Take by mouth.    Marland Kitchen HYDROcodone-acetaminophen (NORCO/VICODIN) 5-325 MG tablet Take 1-2 tablets by mouth every 6 (six) hours as needed. 60 tablet 0  . losartan-hydrochlorothiazide (HYZAAR) 100-25 MG per tablet     . losartan-hydrochlorothiazide (HYZAAR) 100-25 MG tablet   0  . Multiple Vitamin (MULTIVITAMIN) tablet Take 1 tablet by mouth daily.    . potassium chloride SA (K-DUR,KLOR-CON) 20 MEQ tablet take 1 tablet by mouth once daily 60 tablet 0  . sertraline (ZOLOFT) 100 MG tablet Take 200 mg by mouth every morning.     Gregory Schneider 40 MG capsule TAKE 4 CAPSULES BY MOUTH DAILY 120 capsule 0  . Zoledronic Acid (ZOMETA) 4 MG/100ML IVPB Inject 4 mg  into the vein every 30 (thirty) days.     No current facility-administered medications for this visit.     Allergies:  Allergies  Allergen Reactions  . Penicillins Other (See Comments)    Patient stated,"I don't remember what kind of reaction because I was a kid."    His past medical history, social history reviewed and unchanged.  Physical Exam: Blood pressure (!) 184/95, pulse 77, temperature 97.8 F (36.6 C), temperature source  Oral, resp. rate 18, weight (!) 341 lb 2 oz (154.7 kg), SpO2 98 %.    ECOG: 1 General appearance: Well-appearing gentleman appeared without distress. Head: Normocephalic, without obvious abnormality no oral thrush or ulcers. Neck: no adenopathy, no thyroid masses. Lymph nodes: Cervical, supraclavicular, and axillary nodes normal. Heart:regular rate and rhythm, S1, S2 normal, no murmur, click, rub or gallop Lung:chest clear, no wheezing, rales, normal symmetric air entry. Abdomen: soft, non-tender, without masses or organomegaly. No shifting dullness or ascites. EXT:no erythema, induration, or nodules.  Neurological examination: No deficits noted. She is ambulating without difficulties.   Lab Results: Lab Results  Component Value Date   WBC 6.4 03/22/2017   HGB 13.5 03/22/2017   HCT 38.1 (L) 03/22/2017   MCV 85.7 03/22/2017   PLT 133 (L) 03/22/2017     Chemistry      Component Value Date/Time   NA 137 03/22/2017 0904   K 3.3 (L) 03/22/2017 0904   CL 103 01/01/2014 1213   CL 104 03/09/2013 0921   CO2 26 03/22/2017 0904   BUN 20.0 03/22/2017 0904   CREATININE 0.8 03/22/2017 0904      Component Value Date/Time   CALCIUM 9.1 03/22/2017 0904   ALKPHOS 74 03/22/2017 0904   AST 18 03/22/2017 0904   ALT 28 03/22/2017 0904   BILITOT 0.42 03/22/2017 0904         Impression and Plan:    61 year old gentleman with the following issues:  1. Castrate-resistant prostate cancer: He had a rising PSA despite combined androgen deprivation and castrate level testosterone.  His disease includes pelvic recurrence as well as bony metastasis.  He is currently on Gregory Schneider has started in February 2018 and have tolerated it well. His PSA continues to show reasonable response with excellent clinical benefit.  The plan is to continue the same dose and schedule without any reduction.  2. Androgen deprivation. He S/P Bilateral orchiectomy.  3. Bony disease. He continues Zometa monthly at  [facility]. He will continue his calcium and vitamin D supplements.  No recent complications to this therapy noted.  4. Hypertension: His following up with primary care physician during this issue. His blood pressure is elevated today but he has not taking his medication.  5. Back pain: Reasonably controlled with hydrocodone which she takes very infrequently mostly at nighttime. This was refilled for him today.  6. Hypokalemia: This has normalized off Zytiga.  7. Followup. In 2 months to follow his progress.    [facility] MD 5/10/20189:56 AM

## 2017-03-29 ENCOUNTER — Other Ambulatory Visit: Payer: Self-pay | Admitting: Oncology

## 2017-03-29 MED FILL — XTANDI 40 MG CAPSULE: 40 | 30 days supply | Qty: 120 | Fill #0

## 2017-04-07 DIAGNOSIS — C61 Malignant neoplasm of prostate: Secondary | ICD-10-CM | POA: Diagnosis not present

## 2017-04-07 DIAGNOSIS — Z5111 Encounter for antineoplastic chemotherapy: Secondary | ICD-10-CM | POA: Diagnosis not present

## 2017-04-26 ENCOUNTER — Other Ambulatory Visit: Payer: Self-pay | Admitting: Oncology

## 2017-04-26 MED FILL — XTANDI 40 MG CAPSULE: 40 | 30 days supply | Qty: 120 | Fill #0

## 2017-05-09 DIAGNOSIS — C61 Malignant neoplasm of prostate: Secondary | ICD-10-CM | POA: Diagnosis not present

## 2017-05-09 DIAGNOSIS — Z5111 Encounter for antineoplastic chemotherapy: Secondary | ICD-10-CM | POA: Diagnosis not present

## 2017-05-27 ENCOUNTER — Other Ambulatory Visit: Payer: Self-pay | Admitting: Oncology

## 2017-05-30 MED FILL — XTANDI 40 MG CAPSULE: 40 | 30 days supply | Qty: 120 | Fill #0

## 2017-06-01 ENCOUNTER — Ambulatory Visit (HOSPITAL_BASED_OUTPATIENT_CLINIC_OR_DEPARTMENT_OTHER): Payer: Medicare HMO | Admitting: Oncology

## 2017-06-01 ENCOUNTER — Other Ambulatory Visit (HOSPITAL_BASED_OUTPATIENT_CLINIC_OR_DEPARTMENT_OTHER): Payer: Medicare HMO

## 2017-06-01 ENCOUNTER — Telehealth: Payer: Self-pay | Admitting: Oncology

## 2017-06-01 VITALS — BP 184/91 | HR 71 | Temp 98.1°F | Resp 18 | Ht 75.0 in | Wt 338.5 lb

## 2017-06-01 DIAGNOSIS — I10 Essential (primary) hypertension: Secondary | ICD-10-CM

## 2017-06-01 DIAGNOSIS — C61 Malignant neoplasm of prostate: Secondary | ICD-10-CM

## 2017-06-01 DIAGNOSIS — E876 Hypokalemia: Secondary | ICD-10-CM | POA: Diagnosis not present

## 2017-06-01 DIAGNOSIS — C7951 Secondary malignant neoplasm of bone: Secondary | ICD-10-CM | POA: Diagnosis not present

## 2017-06-01 LAB — CBC WITH DIFFERENTIAL/PLATELET
BASO%: 0.7 % (ref 0.0–2.0)
Basophils Absolute: 0 10*3/uL (ref 0.0–0.1)
EOS%: 2.2 % (ref 0.0–7.0)
Eosinophils Absolute: 0.2 10*3/uL (ref 0.0–0.5)
HCT: 42 % (ref 38.4–49.9)
HGB: 14.4 g/dL (ref 13.0–17.1)
LYMPH%: 26.7 % (ref 14.0–49.0)
MCH: 30 pg (ref 27.2–33.4)
MCHC: 34.4 g/dL (ref 32.0–36.0)
MCV: 87.2 fL (ref 79.3–98.0)
MONO#: 0.4 10*3/uL (ref 0.1–0.9)
MONO%: 6 % (ref 0.0–14.0)
NEUT#: 4.6 10*3/uL (ref 1.5–6.5)
NEUT%: 64.4 % (ref 39.0–75.0)
Platelets: 136 10*3/uL — ABNORMAL LOW (ref 140–400)
RBC: 4.81 10*6/uL (ref 4.20–5.82)
RDW: 14.3 % (ref 11.0–14.6)
WBC: 7.1 10*3/uL (ref 4.0–10.3)
lymph#: 1.9 10*3/uL (ref 0.9–3.3)

## 2017-06-01 LAB — COMPREHENSIVE METABOLIC PANEL
ALT: 48 U/L (ref 0–55)
AST: 27 U/L (ref 5–34)
Albumin: 4.1 g/dL (ref 3.5–5.0)
Alkaline Phosphatase: 73 U/L (ref 40–150)
Anion Gap: 12 mEq/L — ABNORMAL HIGH (ref 3–11)
BUN: 17.7 mg/dL (ref 7.0–26.0)
CO2: 26 mEq/L (ref 22–29)
Calcium: 9.9 mg/dL (ref 8.4–10.4)
Chloride: 99 mEq/L (ref 98–109)
Creatinine: 0.9 mg/dL (ref 0.7–1.3)
EGFR: >90 mL/min/{1.73_m2} (ref >90)
Glucose: 106 mg/dl (ref 70–140)
Potassium: 3.3 mEq/L — ABNORMAL LOW (ref 3.5–5.1)
Sodium: 137 mEq/L (ref 136–145)
Total Bilirubin: 0.54 mg/dL (ref 0.20–1.20)
Total Protein: 7.6 g/dL (ref 6.4–8.3)

## 2017-06-01 MED ORDER — HYDROCODONE-ACETAMINOPHEN 5-325 MG PO TABS: 1.0000 | ORAL_TABLET | Freq: Four times a day (QID) | ORAL | 0 refills | Status: DC | PRN

## 2017-06-01 NOTE — Telephone Encounter (Signed)
Gave patient avs report and appointments for October  °

## 2017-06-01 NOTE — Progress Notes (Signed)
Hematology and Oncology Follow Up Visit  Gregory Schneider [id number] 1956-04-28 61 y.o. 06/01/2017 3:10 PM   Principle Diagnosis: 61 year old with prostate cancer diagnosed in 2010. PSA was 15 and subsequently underwent a biopsy which showed a Gleason score 4 + 5 = 9 in the right and the left apex of his prostate, indicating a poorly-differentiated adenocarcinoma of the prostate. 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  Prior Therapy:  Combined androgen deprivation with Lupron and Casodex. He had a good response and then developed a rise in the PSA with castrate level testosterone.  He is S/P Bilateral simple orchiectomy done on 12/25/2012.  Provenge started on 03/03/12. Therapy Ended in 03/31/2012.  Zytiga started in 10/2013. Therapy discontinued in January 2018 because of progression of disease.  Current therapy:  Xtandi 160 mg daily started in February 2018. He continues to be on Zometa given at  [facility].  Interim History:  Gregory Schneider returns today for a follow up visit. Since his last visit, he continues to do well without any major changes. He does report periodic arthralgias and myalgias but have been manageable with hydrocodone. He continues to take Xtandi and tolerated it well. He denied any complications related to this medication including fatigue, tiredness or GI toxicity. Marland Kitchen He is reporting frequency of urination but he denies any hematuria or dysuria. He denied any bone pain or pathological fractures. His performance status and activity level remain at baseline.    He does not report any headaches, blurry vision, syncope or seizures. He does not report any fevers, chills, sweats or weight loss. He does not report any cough, wheezing or hemoptysis. Not report any chest pain, palpitation orthopnea. He is not reporting any nausea or vomiting.  Does not report any epistaxis, petechiae or lymphadenopathy. His review of system is otherwise  unremarkable.  Medications: Reviewed today and no changes. Current Outpatient Prescriptions  Medication Sig Dispense Refill  . amLODipine (NORVASC) 5 MG tablet Take by mouth.    Marland Kitchen buPROPion (WELLBUTRIN SR) 150 MG 12 hr tablet Take 150 mg by mouth daily.    . calcium carbonate (OS-CAL) 600 MG TABS Take 600 mg by mouth daily.    . calcium-vitamin D (CALCIUM 500/D) 500-200 MG-UNIT per tablet Take by mouth.    . cholecalciferol (VITAMIN D) 1000 UNITS tablet Take 1,000 Units by mouth daily.    . clonazePAM (KLONOPIN) 1 MG tablet Take 1 mg by mouth as needed for anxiety (1-2 tablets daily as needed).     . Cyanocobalamin (RA VITAMIN B-12 TR) 1000 MCG TBCR Take by mouth.    Marland Kitchen HYDROcodone-acetaminophen (NORCO/VICODIN) 5-325 MG tablet Take 1-2 tablets by mouth every 6 (six) hours as needed. 60 tablet 0  . losartan-hydrochlorothiazide (HYZAAR) 100-25 MG per tablet     . losartan-hydrochlorothiazide (HYZAAR) 100-25 MG tablet   0  . Multiple Vitamin (MULTIVITAMIN) tablet Take 1 tablet by mouth daily.    . potassium chloride SA (K-DUR,KLOR-CON) 20 MEQ tablet take 1 tablet by mouth once daily 60 tablet 0  . sertraline (ZOLOFT) 100 MG tablet Take 200 mg by mouth every morning.     Gillermina Phy 40 MG capsule TAKE 4 CAPSULES BY MOUTH DAILY 120 capsule 0  . Zoledronic Acid (ZOMETA) 4 MG/100ML IVPB Inject 4 mg into the vein every 30 (thirty) days.     No current facility-administered medications for this visit.     Allergies:  Allergies  Allergen Reactions  . Penicillins Other (  See Comments)    Patient stated,"I don't remember what kind of reaction because I was a kid."    His past medical history, social history reviewed and unchanged.  Physical Exam: Blood pressure (!) 184/91, pulse 71, temperature 98.1 F (36.7 C), temperature source Oral, resp. rate 18, height 6\' 3"  (1.905 m), weight (!) 338 lb 8 oz (153.5 kg), SpO2 96 %.    ECOG: 1 General appearance: Alert, awake gentleman without  distress. Head: Normocephalic, without obvious abnormality no oral thrush or ulcers. Neck: no adenopathy, no thyroid masses. Lymph nodes: Cervical, supraclavicular, and axillary nodes normal. Heart:regular rate and rhythm, S1, S2 normal, no murmur, click, rub or gallop Lung:chest clear, no wheezing, rales, normal symmetric air entry. Abdomen: soft, non-tender, without masses or organomegaly. No rebound or guarding. EXT:no erythema, induration, or nodules.  Neurological examination: No deficits noted.    Lab Results: Lab Results  Component Value Date   WBC 7.1 06/01/2017   HGB 14.4 06/01/2017   HCT 42.0 06/01/2017   MCV 87.2 06/01/2017   PLT 136 (L) 06/01/2017     Chemistry      Component Value Date/Time   NA 137 03/22/2017 0904   K 3.3 (L) 03/22/2017 0904   CL 103 01/01/2014 1213   CL 104 03/09/2013 0921   CO2 26 03/22/2017 0904   BUN 20.0 03/22/2017 0904   CREATININE 0.8 03/22/2017 0904      Component Value Date/Time   CALCIUM 9.1 03/22/2017 0904   ALKPHOS 74 03/22/2017 0904   AST 18 03/22/2017 0904   ALT 28 03/22/2017 0904   BILITOT 0.42 03/22/2017 0904      Results for Gregory Schneider (MRN [id number]) as of 06/01/2017 15:00  Ref. Range 09/17/2016 09:08 01/13/2017 13:12 03/22/2017 09:04  Prostate Specific Ag, Serum Latest Ref Range: 0.0 - 4.0 ng/mL 3.1 0.5 0.4     Impression and Plan:    61 year old gentleman with the following issues:  1. Castrate-resistant prostate cancer: He had a rising PSA despite combined androgen deprivation and castrate level testosterone.  His disease includes pelvic recurrence as well as bony metastasis.  He is currently on Gillermina Phy has started in February 2018 and have tolerated it well. His PSA is showing excellent response and continues to decline to 0.4.  The plan is to continue the same dose and schedule without any reduction. Different salvage therapy will be used if needed to the future.  2. Androgen deprivation. He S/P Bilateral  orchiectomy.  3. Bony disease. He continues Zometa monthly at [facility]. He will continue his calcium and vitamin D supplements.  No recent complications to this therapy noted.  4. Hypertension: This is elevated today but has been close to normal range outside of his doctor's visit.  5. Back pain: Reasonably controlled with hydrocodone which she takes very infrequently mostly at nighttime. This was refilled for him today.  6. Hypokalemia: Relatively stable without any need for supplementation.  7. Followup. In 2 months to follow his progress.    [facility] MD 7/18/20183:10 PM

## 2017-06-02 ENCOUNTER — Telehealth: Payer: Self-pay | Admitting: *Deleted

## 2017-06-02 LAB — PSA: Prostate Specific Ag, Serum: 0.4 ng/mL (ref 0.0–4.0)

## 2017-06-02 NOTE — Telephone Encounter (Signed)
-----   Message from Gregory Portela, MD sent at 06/02/2017  8:19 AM EDT ----- Please let him know his PSA is still low.

## 2017-06-02 NOTE — Telephone Encounter (Signed)
Spoke with patient, gave results of last PSA 

## 2017-06-22 ENCOUNTER — Other Ambulatory Visit: Payer: Self-pay | Admitting: Oncology

## 2017-06-27 ENCOUNTER — Telehealth: Payer: Self-pay | Admitting: Pharmacy Technician

## 2017-06-27 NOTE — Telephone Encounter (Signed)
Oral Oncology Patient Advocate Encounter  Received notification from [facility] Patient Assistance program that patient has been successfully enrolled into their program to receive Xtandi from the manufacturer at $0 out of pocket until 11/14/2017.   I called and spoke with patient.   He knows we will have to re-apply for continued enrollment in 2019.  Patient knows to call the office with questions or concerns and is appreciative of our help.   Oral Oncology Clinic will continue to follow.  Gilmore Laroche, CPhT, [place] Oral Oncology Patient Advocate [phone] 06/27/2017 3:52 PM

## 2017-07-15 DIAGNOSIS — R350 Frequency of micturition: Secondary | ICD-10-CM | POA: Diagnosis not present

## 2017-07-15 DIAGNOSIS — C61 Malignant neoplasm of prostate: Secondary | ICD-10-CM | POA: Diagnosis not present

## 2017-07-15 DIAGNOSIS — C7951 Secondary malignant neoplasm of bone: Secondary | ICD-10-CM | POA: Diagnosis not present

## 2017-08-01 ENCOUNTER — Other Ambulatory Visit: Payer: Self-pay | Admitting: *Deleted

## 2017-08-01 DIAGNOSIS — C61 Malignant neoplasm of prostate: Secondary | ICD-10-CM

## 2017-08-01 DIAGNOSIS — C7951 Secondary malignant neoplasm of bone: Secondary | ICD-10-CM

## 2017-08-01 MED ORDER — HYDROCODONE-ACETAMINOPHEN 5-325 MG PO TABS: 1.0000 | ORAL_TABLET | Freq: Four times a day (QID) | ORAL | 0 refills | Status: DC | PRN

## 2017-08-01 NOTE — Telephone Encounter (Signed)
Called patient and informed him his prescription is ready for pickup. He verbalized understanding.

## 2017-08-02 ENCOUNTER — Other Ambulatory Visit: Payer: Self-pay | Admitting: *Deleted

## 2017-08-02 MED ORDER — ENZALUTAMIDE 40 MG PO CAPS: 160.0000 mg | ORAL_CAPSULE | Freq: Every day | ORAL | 0 refills | Status: DC

## 2017-08-04 ENCOUNTER — Other Ambulatory Visit: Payer: Self-pay | Admitting: *Deleted

## 2017-08-04 MED ORDER — ENZALUTAMIDE 40 MG PO CAPS: 160.0000 mg | ORAL_CAPSULE | Freq: Every day | ORAL | 0 refills | Status: DC

## 2017-08-11 DIAGNOSIS — Z79899 Other long term (current) drug therapy: Secondary | ICD-10-CM | POA: Diagnosis not present

## 2017-08-11 DIAGNOSIS — Z0000 Encounter for general adult medical examination without abnormal findings: Secondary | ICD-10-CM | POA: Diagnosis not present

## 2017-08-11 DIAGNOSIS — I10 Essential (primary) hypertension: Secondary | ICD-10-CM | POA: Diagnosis not present

## 2017-08-11 DIAGNOSIS — Z23 Encounter for immunization: Secondary | ICD-10-CM | POA: Diagnosis not present

## 2017-08-11 DIAGNOSIS — R739 Hyperglycemia, unspecified: Secondary | ICD-10-CM | POA: Diagnosis not present

## 2017-08-11 DIAGNOSIS — Z6841 Body Mass Index (BMI) 40.0 and over, adult: Secondary | ICD-10-CM | POA: Diagnosis not present

## 2017-08-17 ENCOUNTER — Other Ambulatory Visit (HOSPITAL_BASED_OUTPATIENT_CLINIC_OR_DEPARTMENT_OTHER): Payer: Medicare HMO

## 2017-08-17 ENCOUNTER — Telehealth: Payer: Self-pay | Admitting: Oncology

## 2017-08-17 ENCOUNTER — Ambulatory Visit (HOSPITAL_BASED_OUTPATIENT_CLINIC_OR_DEPARTMENT_OTHER): Payer: Medicare HMO | Admitting: Oncology

## 2017-08-17 VITALS — BP 183/101 | HR 79 | Temp 98.3°F | Resp 18 | Ht 75.0 in | Wt 347.6 lb

## 2017-08-17 DIAGNOSIS — I10 Essential (primary) hypertension: Secondary | ICD-10-CM

## 2017-08-17 DIAGNOSIS — C7951 Secondary malignant neoplasm of bone: Secondary | ICD-10-CM | POA: Diagnosis not present

## 2017-08-17 DIAGNOSIS — C61 Malignant neoplasm of prostate: Secondary | ICD-10-CM | POA: Diagnosis not present

## 2017-08-17 DIAGNOSIS — E291 Testicular hypofunction: Secondary | ICD-10-CM

## 2017-08-17 LAB — CBC WITH DIFFERENTIAL/PLATELET
BASO%: 0.3 % (ref 0.0–2.0)
Basophils Absolute: 0 10*3/uL (ref 0.0–0.1)
EOS%: 2.2 % (ref 0.0–7.0)
Eosinophils Absolute: 0.1 10*3/uL (ref 0.0–0.5)
HCT: 41.8 % (ref 38.4–49.9)
HGB: 14.4 g/dL (ref 13.0–17.1)
LYMPH%: 22.9 % (ref 14.0–49.0)
MCH: 30.5 pg (ref 27.2–33.4)
MCHC: 34.4 g/dL (ref 32.0–36.0)
MCV: 88.6 fL (ref 79.3–98.0)
MONO#: 0.4 10*3/uL (ref 0.1–0.9)
MONO%: 5.9 % (ref 0.0–14.0)
NEUT#: 4.4 10*3/uL (ref 1.5–6.5)
NEUT%: 68.7 % (ref 39.0–75.0)
Platelets: 143 10*3/uL (ref 140–400)
RBC: 4.72 10*6/uL (ref 4.20–5.82)
RDW: 14.2 % (ref 11.0–14.6)
WBC: 6.5 10*3/uL (ref 4.0–10.3)
lymph#: 1.5 10*3/uL (ref 0.9–3.3)
nRBC: 0 % (ref 0–0)

## 2017-08-17 LAB — COMPREHENSIVE METABOLIC PANEL
ALT: 55 U/L (ref 0–55)
AST: 27 U/L (ref 5–34)
Albumin: 4.1 g/dL (ref 3.5–5.0)
Alkaline Phosphatase: 78 U/L (ref 40–150)
Anion Gap: 11 mEq/L (ref 3–11)
BUN: 21.6 mg/dL (ref 7.0–26.0)
CO2: 25 mEq/L (ref 22–29)
Calcium: 10.4 mg/dL (ref 8.4–10.4)
Chloride: 100 mEq/L (ref 98–109)
Creatinine: 0.9 mg/dL (ref 0.7–1.3)
EGFR: >90 mL/min/{1.73_m2} (ref >90)
Glucose: 125 mg/dl (ref 70–140)
Potassium: 4.2 mEq/L (ref 3.5–5.1)
Sodium: 136 mEq/L (ref 136–145)
Total Bilirubin: 0.46 mg/dL (ref 0.20–1.20)
Total Protein: 7.8 g/dL (ref 6.4–8.3)

## 2017-08-17 MED ORDER — HYDROCODONE-ACETAMINOPHEN 5-325 MG PO TABS: 1.0000 | ORAL_TABLET | Freq: Four times a day (QID) | ORAL | 0 refills | Status: DC | PRN

## 2017-08-17 NOTE — Telephone Encounter (Signed)
Scheduled appt per 10/3 los - Gave patient AVS and calender per los.  

## 2017-08-17 NOTE — Progress Notes (Signed)
Hematology and Oncology Follow Up Visit  Gregory Schneider [id number] [birth year] 61 y.o. 08/17/2017 11:06 AM   Principle Diagnosis: 62 year old with prostate cancer diagnosed in 2010. PSA was 15 and subsequently underwent a biopsy which showed a Gleason score 4 + 5 = 9 in the right and the left apex of his prostate, indicating a poorly-differentiated adenocarcinoma of the prostate. 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  Prior Therapy:  Combined androgen deprivation with Lupron and Casodex. He had a good response and then developed a rise in the PSA with castrate level testosterone.  He is S/P Bilateral simple orchiectomy done on 12/25/2012.  Provenge started on 03/03/12. Therapy Ended in 03/31/2012.  Zytiga started in 10/2013. Therapy discontinued in January 2018 because of progression of disease.  Current therapy:  Xtandi 160 mg daily started in February 2018. He continues to be on Zometa given at  [facility].  Interim History:  Gregory Schneider returns today for a follow up visit. Since his last visit, he reports some mild fatigue which has slightly increased in the last few months. He continues to take Xtandi and tolerated without any other complaints. He denied nausea, vomiting or increased pain. He is reporting frequency of urination but he denies any hematuria or dysuria. He denied any bone pain or pathological fractures. He continues to have chronic arthralgias and uses hydrocodone for His performance status and activity level remain at baseline.    He does not report any headaches, blurry vision, syncope or seizures. He does not report any fevers, chills, sweats or weight loss. He does not report any cough, wheezing or hemoptysis. Not report any chest pain, palpitation orthopnea. He is not reporting any nausea or vomiting.  Does not report any epistaxis, petechiae or lymphadenopathy. His review of system is otherwise unremarkable.  Medications: Reviewed  today and no changes. Current Outpatient Prescriptions  Medication Sig Dispense Refill  . amLODipine (NORVASC) 5 MG tablet Take by mouth.    Marland Kitchen buPROPion (WELLBUTRIN SR) 150 MG 12 hr tablet Take 150 mg by mouth daily.    . calcium carbonate (OS-CAL) 600 MG TABS Take 600 mg by mouth daily.    . calcium-vitamin D (CALCIUM 500/D) 500-200 MG-UNIT per tablet Take by mouth.    . cholecalciferol (VITAMIN D) 1000 UNITS tablet Take 1,000 Units by mouth daily.    . clonazePAM (KLONOPIN) 1 MG tablet Take 1 mg by mouth as needed for anxiety (1-2 tablets daily as needed).     . Cyanocobalamin (RA VITAMIN B-12 TR) 1000 MCG TBCR Take by mouth.    . enzalutamide (XTANDI) 40 MG capsule Take 4 capsules (160 mg total) by mouth daily. 120 capsule 0  . HYDROcodone-acetaminophen (NORCO/VICODIN) 5-325 MG tablet Take 1-2 tablets by mouth every 6 (six) hours as needed. 60 tablet 0  . losartan-hydrochlorothiazide (HYZAAR) 100-25 MG per tablet     . losartan-hydrochlorothiazide (HYZAAR) 100-25 MG tablet   0  . Multiple Vitamin (MULTIVITAMIN) tablet Take 1 tablet by mouth daily.    . potassium chloride SA (K-DUR,KLOR-CON) 20 MEQ tablet take 1 tablet by mouth once daily 60 tablet 0  . sertraline (ZOLOFT) 100 MG tablet Take 200 mg by mouth every morning.     . Zoledronic Acid (ZOMETA) 4 MG/100ML IVPB Inject 4 mg into the vein every 30 (thirty) days.     No current facility-administered medications for this visit.     Allergies:  Allergies  Allergen Reactions  . Penicillins Other (  See Comments)    Patient stated,"I don't remember what kind of reaction because I was a kid."    His past medical history, social history reviewed and unchanged.  Physical Exam: Blood pressure (!) 183/101, pulse 79, temperature 98.3 F (36.8 C), temperature source Oral, resp. rate 18, height 6\' 3"  (1.905 m), weight (!) 347 lb 9.6 oz (157.7 kg), SpO2 99 %.    ECOG: 1 General appearance: Well-appearing gentleman without  distress. Head: Normocephalic, without obvious abnormality no oral ulcers or lesions. Neck: no adenopathy, no thyroid masses. Lymph nodes: Cervical, supraclavicular, and axillary nodes normal. Heart:regular rate and rhythm, S1, S2 normal, no murmur, click, rub or gallop Lung:chest clear, no wheezing, rales, normal symmetric air entry. Abdomen: soft, non-tender, without masses or organomegaly. No shifting dullness or ascites. EXT:no erythema, induration, or nodules.  Neurological examination: No deficits noted.    Lab Results: Lab Results  Component Value Date   WBC 6.5 08/17/2017   HGB 14.4 08/17/2017   HCT 41.8 08/17/2017   MCV 88.6 08/17/2017   PLT 143 08/17/2017     Chemistry      Component Value Date/Time   NA 137 06/01/2017 1446   K 3.3 (L) 06/01/2017 1446   CL 103 01/01/2014 1213   CL 104 03/09/2013 0921   CO2 26 06/01/2017 1446   BUN 17.7 06/01/2017 1446   CREATININE 0.9 06/01/2017 1446      Component Value Date/Time   CALCIUM 9.9 06/01/2017 1446   ALKPHOS 73 06/01/2017 1446   AST 27 06/01/2017 1446   ALT 48 06/01/2017 1446   BILITOT 0.54 06/01/2017 1446     Results for Gregory Schneider (MRN [id number]) as of 08/17/2017 10:46  Ref. Range 01/13/2017 13:12 03/22/2017 09:04 06/01/2017 14:46  Prostate Specific Ag, Serum Latest Ref Range: 0.0 - 4.0 ng/mL 0.5 0.4 0.4     Impression and Plan:    61 year old gentleman with the following issues:  1. Castrate-resistant prostate cancer: He had a rising PSA despite combined androgen deprivation and castrate level testosterone.  His disease includes pelvic recurrence as well as bony metastasis.  He is currently on Gillermina Phy has started in February 2018 and have tolerated it well.   His PSA is showing excellent response and continues to decline to 0.4.  Risks and benefits of continuing the current dose was discussed today. I offered him the option of dose reduction given his recent fatigue but he elected to continue with the full  dose. The plan is to continue at this time and monitor his PSA periodically. We'll repeat imaging studies if he develops any symptoms or PSA starts to rise.  2. Androgen deprivation. He S/P Bilateral orchiectomy.  3. Bony disease. He continues Zometa monthly at [facility]. He will continue his calcium and vitamin D supplements.  No recent complications to this therapy noted.  4. Hypertension: I continue to encourage him to do periodic outpatient blood pressure monitoring. He might require blood pressure medication adjustment by his primary care physician.  5. Back pain: Reasonably controlled with hydrocodone which she takes very infrequently mostly at nighttime. This was refilled for him today.  6. Followup. In 3 months to follow his progress.    Zola Button MD 10/3/201811:06 AM

## 2017-08-17 NOTE — Addendum Note (Signed)
Addended by: Randolm Idol on: 08/17/2017 11:31 AM   Modules accepted: Orders

## 2017-08-18 ENCOUNTER — Telehealth: Payer: Self-pay | Admitting: *Deleted

## 2017-08-18 LAB — PSA: Prostate Specific Ag, Serum: 0.6 ng/mL (ref 0.0–4.0)

## 2017-08-18 NOTE — Telephone Encounter (Signed)
As noted below by Dr. [name], I informed patient of his PSA level. He verbalized understanding.  

## 2017-08-18 NOTE — Telephone Encounter (Signed)
-----   Message from Wyatt Portela, MD sent at 08/18/2017  8:33 AM EDT ----- Please let him know his PSA is still low

## 2017-08-19 DIAGNOSIS — C7951 Secondary malignant neoplasm of bone: Secondary | ICD-10-CM | POA: Diagnosis not present

## 2017-08-19 DIAGNOSIS — C61 Malignant neoplasm of prostate: Secondary | ICD-10-CM | POA: Diagnosis not present

## 2017-09-06 ENCOUNTER — Telehealth: Payer: Self-pay

## 2017-09-06 DIAGNOSIS — R69 Illness, unspecified: Secondary | ICD-10-CM | POA: Diagnosis not present

## 2017-09-06 NOTE — Telephone Encounter (Signed)
Pt called that he was having jaw sensitivity and went to dentist. The xrays showed nothing wrong. 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 This gets hypersensitive after each dose of zometa. He is asking if he should continue the zometa or is there another medication he can use. His next dose is due in about 10 days.

## 2017-09-06 NOTE — Telephone Encounter (Signed)
He should stop Zometa for now. He gets it at [facility].

## 2017-09-07 NOTE — Telephone Encounter (Signed)
S/w pt per Dr Alen Blew stop zometa for now. Next appt in January.

## 2017-09-09 ENCOUNTER — Other Ambulatory Visit: Payer: Self-pay | Admitting: *Deleted

## 2017-09-09 MED ORDER — ENZALUTAMIDE 40 MG PO CAPS: 160.0000 mg | ORAL_CAPSULE | Freq: Every day | ORAL | 0 refills | Status: DC

## 2017-09-26 DIAGNOSIS — R69 Illness, unspecified: Secondary | ICD-10-CM | POA: Diagnosis not present

## 2017-09-27 DIAGNOSIS — T458X5A Adverse effect of other primarily systemic and hematological agents, initial encounter: Secondary | ICD-10-CM | POA: Diagnosis not present

## 2017-09-27 DIAGNOSIS — Z7983 Long term (current) use of bisphosphonates: Secondary | ICD-10-CM | POA: Diagnosis not present

## 2017-09-27 DIAGNOSIS — M87180 Osteonecrosis due to drugs, jaw: Secondary | ICD-10-CM | POA: Diagnosis not present

## 2017-10-05 ENCOUNTER — Other Ambulatory Visit: Payer: Self-pay | Admitting: *Deleted

## 2017-10-05 MED ORDER — ENZALUTAMIDE 40 MG PO CAPS: 160.0000 mg | ORAL_CAPSULE | Freq: Every day | ORAL | 0 refills | Status: DC

## 2017-10-11 ENCOUNTER — Other Ambulatory Visit: Payer: Self-pay | Admitting: *Deleted

## 2017-10-11 DIAGNOSIS — C61 Malignant neoplasm of prostate: Secondary | ICD-10-CM

## 2017-10-11 DIAGNOSIS — C7951 Secondary malignant neoplasm of bone: Secondary | ICD-10-CM

## 2017-10-11 MED ORDER — HYDROCODONE-ACETAMINOPHEN 5-325 MG PO TABS: 1.0000 | ORAL_TABLET | Freq: Four times a day (QID) | ORAL | 0 refills | Status: DC | PRN

## 2017-10-11 NOTE — Telephone Encounter (Signed)
Informed patient that prescription was ready for pickup. He verbalized understanding.

## 2017-10-13 DIAGNOSIS — I10 Essential (primary) hypertension: Secondary | ICD-10-CM | POA: Diagnosis not present

## 2017-10-13 DIAGNOSIS — G589 Mononeuropathy, unspecified: Secondary | ICD-10-CM | POA: Diagnosis not present

## 2017-10-13 DIAGNOSIS — K08409 Partial loss of teeth, unspecified cause, unspecified class: Secondary | ICD-10-CM | POA: Diagnosis not present

## 2017-10-13 DIAGNOSIS — R69 Illness, unspecified: Secondary | ICD-10-CM | POA: Diagnosis not present

## 2017-10-13 DIAGNOSIS — Z0000 Encounter for general adult medical examination without abnormal findings: Secondary | ICD-10-CM | POA: Diagnosis not present

## 2017-10-13 DIAGNOSIS — C7951 Secondary malignant neoplasm of bone: Secondary | ICD-10-CM | POA: Diagnosis not present

## 2017-10-13 DIAGNOSIS — C61 Malignant neoplasm of prostate: Secondary | ICD-10-CM | POA: Diagnosis not present

## 2017-10-19 ENCOUNTER — Telehealth: Payer: Self-pay | Admitting: Pharmacy Technician

## 2017-10-19 NOTE — Telephone Encounter (Signed)
Oral Oncology Patient Advocate Encounter  Received communication from [employer] that the patient's eligibility in the patient assistance program was due for re-enrollment.  The renewal application has been completed and faxed in an effort to keep the patient's out of pocket expense for Xtandi at $0.    Application completed and faxed to [phone].   [place] phone number for follow up is [phone].   This encounter will be updated until final determination.  Fabio Asa. Melynda Keller, [place] Patient [place] [phone] 10/19/2017 1:02 PM

## 2017-11-03 NOTE — Telephone Encounter (Signed)
Oral Oncology Patient Advocate Encounter  Received notification from [place] that Gregory Schneider has been successfully re-enrolled into their program.  He will continue to receive Xtandi from the manufacturer at $0 out of pocket until 11/14/2018.   I called and spoke with patient about the good news.   He understands we will have to re-apply for 2020 enrollment  Patient call the office with any additional questions or concerns.  Gilmore Laroche, CPhT, [place] Oral Oncology Patient Advocate [phone] 11/03/2017 4:03 PM

## 2017-11-04 ENCOUNTER — Other Ambulatory Visit: Payer: Self-pay | Admitting: *Deleted

## 2017-11-04 MED ORDER — ENZALUTAMIDE 40 MG PO CAPS: 160.0000 mg | ORAL_CAPSULE | Freq: Every day | ORAL | 0 refills | Status: DC

## 2017-11-10 ENCOUNTER — Other Ambulatory Visit: Payer: Self-pay | Admitting: Pharmacist

## 2017-11-10 DIAGNOSIS — C61 Malignant neoplasm of prostate: Secondary | ICD-10-CM

## 2017-11-10 MED ORDER — ENZALUTAMIDE 40 MG PO CAPS: 160.0000 mg | ORAL_CAPSULE | Freq: Every day | ORAL | 0 refills | Status: DC

## 2017-11-17 ENCOUNTER — Ambulatory Visit (HOSPITAL_BASED_OUTPATIENT_CLINIC_OR_DEPARTMENT_OTHER): Payer: Medicare HMO | Admitting: Oncology

## 2017-11-17 ENCOUNTER — Telehealth: Payer: Self-pay | Admitting: Oncology

## 2017-11-17 ENCOUNTER — Other Ambulatory Visit (HOSPITAL_BASED_OUTPATIENT_CLINIC_OR_DEPARTMENT_OTHER): Payer: Medicare HMO

## 2017-11-17 ENCOUNTER — Other Ambulatory Visit: Payer: Self-pay | Admitting: *Deleted

## 2017-11-17 VITALS — BP 160/96 | HR 83 | Temp 97.8°F | Resp 19 | Ht 75.0 in | Wt 351.7 lb

## 2017-11-17 DIAGNOSIS — I10 Essential (primary) hypertension: Secondary | ICD-10-CM

## 2017-11-17 DIAGNOSIS — C61 Malignant neoplasm of prostate: Secondary | ICD-10-CM

## 2017-11-17 DIAGNOSIS — C7951 Secondary malignant neoplasm of bone: Secondary | ICD-10-CM

## 2017-11-17 LAB — CBC WITH DIFFERENTIAL/PLATELET
BASO%: 0.2 % (ref 0.0–2.0)
Basophils Absolute: 0 10*3/uL (ref 0.0–0.1)
EOS%: 3 % (ref 0.0–7.0)
Eosinophils Absolute: 0.2 10*3/uL (ref 0.0–0.5)
HCT: 40.5 % (ref 38.4–49.9)
HGB: 14 g/dL (ref 13.0–17.1)
LYMPH%: 25.4 % (ref 14.0–49.0)
MCH: 30.7 pg (ref 27.2–33.4)
MCHC: 34.6 g/dL (ref 32.0–36.0)
MCV: 88.8 fL (ref 79.3–98.0)
MONO#: 0.3 10*3/uL (ref 0.1–0.9)
MONO%: 5.2 % (ref 0.0–14.0)
NEUT#: 4.2 10*3/uL (ref 1.5–6.5)
NEUT%: 66.2 % (ref 39.0–75.0)
Platelets: 140 10*3/uL (ref 140–400)
RBC: 4.56 10*6/uL (ref 4.20–5.82)
RDW: 13.8 % (ref 11.0–14.6)
WBC: 6.4 10*3/uL (ref 4.0–10.3)
lymph#: 1.6 10*3/uL (ref 0.9–3.3)

## 2017-11-17 LAB — COMPREHENSIVE METABOLIC PANEL
ALT: 47 U/L (ref 0–55)
AST: 21 U/L (ref 5–34)
Albumin: 3.9 g/dL (ref 3.5–5.0)
Alkaline Phosphatase: 89 U/L (ref 40–150)
Anion Gap: 9 mEq/L (ref 3–11)
BUN: 19.7 mg/dL (ref 7.0–26.0)
CO2: 28 mEq/L (ref 22–29)
Calcium: 9.5 mg/dL (ref 8.4–10.4)
Chloride: 99 mEq/L (ref 98–109)
Creatinine: 0.9 mg/dL (ref 0.7–1.3)
EGFR: >60 mL/min/{1.73_m2} (ref >60)
Glucose: 142 mg/dl — ABNORMAL HIGH (ref 70–140)
Potassium: 3.8 mEq/L (ref 3.5–5.1)
Sodium: 136 mEq/L (ref 136–145)
Total Bilirubin: 0.39 mg/dL (ref 0.20–1.20)
Total Protein: 7.4 g/dL (ref 6.4–8.3)

## 2017-11-17 MED ORDER — HYDROCODONE-ACETAMINOPHEN 5-325 MG PO TABS: 1.0000 | ORAL_TABLET | Freq: Four times a day (QID) | ORAL | 0 refills | Status: DC | PRN

## 2017-11-17 NOTE — Progress Notes (Signed)
Hematology and Oncology Follow Up Visit  Gregory Schneider [id number] 11/22/1955 62 y.o. 11/17/2017 10:25 AM   Principle Diagnosis: 62 year old with prostate cancer diagnosed in 2010. PSA was 15 and subsequently underwent a biopsy which showed a Gleason score 4 + 5 = 9 in the right and the left apex of his prostate, indicating a poorly-differentiated adenocarcinoma of the prostate. 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  Prior Therapy:  Combined androgen deprivation with Lupron and Casodex. He had a good response and then developed a rise in the PSA with castrate level testosterone.  He is S/P Bilateral simple orchiectomy done on 12/25/2012.  Provenge started on 03/03/12. Therapy Ended in 03/31/2012.  Zytiga started in 10/2013. Therapy discontinued in January 2018 because of progression of disease.  Current therapy:  Xtandi 160 mg daily started in February 2018. He continues to be on Zometa given at  [facility].  Has been discontinued due to dental complications.  Interim History:  Gregory Schneider returns today for a follow up visit. Since his last visit, he reports no major changes in his health.  He started developing dental pain and discomfort and he was instructed to stop Zometa immediately.  He was evaluated by oral surgery and no osteonecrosis has been noted although a dental infection was diagnosed and appears to have resolved at this time.  He denies any dental pain at this time.   He continues to take Xtandi and tolerated without any other complaints. He denied nausea, vomiting or increased pain. He is reporting frequency of urination but he denies any hematuria or dysuria. He denied any bone pain or pathological fractures. He continues to have chronic arthralgias and uses hydrocodone.  He denies any pelvic pain or discomfort.  He denies any urination difficulties.    He does not report any headaches, blurry vision, syncope or seizures. He does not  report any fevers, chills, sweats or weight loss. He does not report any cough, wheezing or hemoptysis. Not report any chest pain, palpitation orthopnea. He is not reporting any nausea or vomiting.  Does not report any epistaxis, petechiae or lymphadenopathy. His review of system is otherwise unremarkable.  Medications: Reviewed today and no changes. Current Outpatient Medications  Medication Sig Dispense Refill  . albuterol (PROVENTIL HFA;VENTOLIN HFA) 108 (90 Base) MCG/ACT inhaler Inhale into the lungs.    . calcium carbonate (OS-CAL) 600 MG TABS Take 600 mg by mouth daily.    . cholecalciferol (VITAMIN D) 1000 UNITS tablet Take 1,000 Units by mouth daily.    . clonazePAM (KLONOPIN) 1 MG tablet Take 1 mg by mouth as needed for anxiety (1-2 tablets daily as needed).     . Cyanocobalamin (RA VITAMIN B-12 TR) 1000 MCG TBCR Take by mouth.    . enzalutamide (XTANDI) 40 MG capsule Take 4 capsules (160 mg total) by mouth daily. 120 capsule 0  . HYDROcodone-acetaminophen (NORCO/VICODIN) 5-325 MG tablet Take 1-2 tablets by mouth every 6 (six) hours as needed. 60 tablet 0  . losartan-hydrochlorothiazide (HYZAAR) 100-25 MG tablet   0  . Multiple Vitamin (MULTIVITAMIN) tablet Take 1 tablet by mouth daily.    . sertraline (ZOLOFT) 100 MG tablet Take 200 mg by mouth every morning.     Marland Kitchen buPROPion (ZYBAN) 150 MG 12 hr tablet Take 150 mg by mouth.    . chlorhexidine (PERIDEX) 0.12 % solution 2 (two) times daily. as directed  0  . Melatonin 5 MG CAPS Take by mouth.  No current facility-administered medications for this visit.     Allergies:  Allergies  Allergen Reactions  . Penicillins Other (See Comments)    Patient stated,"I don't remember what kind of reaction because I was a kid."    His past medical history, social history reviewed and unchanged.  Physical Exam: Blood pressure (!) 160/96, pulse 83, temperature 97.8 F (36.6 C), temperature source Oral, resp. rate 19, height 6\' 3"  (1.905 m),  weight (!) 351 lb 11.2 oz (159.5 kg), SpO2 96 %.    ECOG: 1 General appearance: Alert, awake gentleman without distress. Head: Normocephalic, without obvious abnormality no oral or ulcers. Neck: no adenopathy, no thyroid masses. Lymph nodes: Cervical, supraclavicular, and axillary nodes normal. Heart:regular rate and rhythm, S1, S2 normal, no murmur, click, rub or gallop Lung:chest clear, no wheezing, rales, normal symmetric air entry. Abdomen: soft, non-tender, without masses or organomegaly. No rebound or guarding. EXT:no erythema, induration, or nodules.  Neurological examination: No deficits noted.    Lab Results: Lab Results  Component Value Date   WBC 6.4 11/17/2017   HGB 14.0 11/17/2017   HCT 40.5 11/17/2017   MCV 88.8 11/17/2017   PLT 140 11/17/2017     Chemistry      Component Value Date/Time   NA 136 08/17/2017 1034   K 4.2 08/17/2017 1034   CL 103 01/01/2014 1213   CL 104 03/09/2013 0921   CO2 25 08/17/2017 1034   BUN 21.6 08/17/2017 1034   CREATININE 0.9 08/17/2017 1034      Component Value Date/Time   CALCIUM 10.4 08/17/2017 1034   ALKPHOS 78 08/17/2017 1034   AST 27 08/17/2017 1034   ALT 55 08/17/2017 1034   BILITOT 0.46 08/17/2017 1034      Results for Gregory Schneider (MRN [id number]) as of 11/17/2017 10:09  Ref. Range 06/01/2017 14:46 08/17/2017 10:34  Prostate Specific Ag, Serum Latest Ref Range: 0.0 - 4.0 ng/mL 0.4 0.6     Impression and Plan:    62 year old gentleman with the following issues:  1. Castrate-resistant prostate cancer: He had a rising PSA despite combined androgen deprivation and castrate level testosterone.  His disease includes pelvic recurrence as well as bony metastasis.  He is currently on Gillermina Phy has started in February 2018 and have tolerated it well.   His PSA is showing excellent response initially with a nadir to 0.4 in July 2018.  His PSA was 0.6 on August 17, 2017.  Plan is to continue with the same dose and schedule  for the time being and repeat imaging studies for staging purposes in March 2019.  Different salvage therapy can be used in the future if needed 2.  2. Androgen deprivation. He S/P Bilateral orchiectomy.  3. Bony disease.  Zometa has been discontinued because of dental complications.  I instructed him to stay off this medication for the time being.  4. Hypertension: His blood pressure appears to be adequately controlled for the time being.  5. Back pain: Related to prostate cancer as well as arthritic pain.  His pain appears to be adequately controlled without any changes.  His hydrocodone was refilled today.  6. Followup. In 3 months to follow his progress.    Zola Button MD 1/3/201910:25 AM

## 2017-11-17 NOTE — Telephone Encounter (Signed)
Gave avs and calendar for April  °

## 2017-11-18 ENCOUNTER — Telehealth: Payer: Self-pay | Admitting: *Deleted

## 2017-11-18 LAB — PSA: Prostate Specific Ag, Serum: 0.9 ng/mL (ref 0.0–4.0)

## 2017-11-18 NOTE — Telephone Encounter (Signed)
-----   Message from Wyatt Portela, MD sent at 11/18/2017  8:11 AM EST ----- Please let him know his PSA slightly up but no change is needed.

## 2017-11-18 NOTE — Telephone Encounter (Signed)
Spoke with patient, gave results of last PSA 

## 2017-12-30 ENCOUNTER — Other Ambulatory Visit: Payer: Self-pay | Admitting: *Deleted

## 2017-12-30 DIAGNOSIS — C61 Malignant neoplasm of prostate: Secondary | ICD-10-CM

## 2017-12-30 MED ORDER — ENZALUTAMIDE 40 MG PO CAPS: 160.0000 mg | ORAL_CAPSULE | Freq: Every day | ORAL | 0 refills | Status: DC

## 2018-01-10 DIAGNOSIS — Z7983 Long term (current) use of bisphosphonates: Secondary | ICD-10-CM | POA: Diagnosis not present

## 2018-01-10 DIAGNOSIS — T458X5A Adverse effect of other primarily systemic and hematological agents, initial encounter: Secondary | ICD-10-CM | POA: Diagnosis not present

## 2018-01-10 DIAGNOSIS — M87180 Osteonecrosis due to drugs, jaw: Secondary | ICD-10-CM | POA: Diagnosis not present

## 2018-01-18 ENCOUNTER — Telehealth: Payer: Self-pay | Admitting: Oncology

## 2018-01-18 NOTE — Telephone Encounter (Signed)
Tried to call patient regarding vm

## 2018-01-19 DIAGNOSIS — R69 Illness, unspecified: Secondary | ICD-10-CM | POA: Diagnosis not present

## 2018-01-23 ENCOUNTER — Encounter: Payer: Self-pay | Admitting: *Deleted

## 2018-01-23 ENCOUNTER — Telehealth: Payer: Self-pay | Admitting: *Deleted

## 2018-01-23 NOTE — Telephone Encounter (Signed)
Patient calling to say bone scan and CT scan have not been scheduled. Called central scheduling and spoke with Irine Seal, she will call patient and schedule today.

## 2018-01-31 ENCOUNTER — Other Ambulatory Visit: Payer: Self-pay | Admitting: *Deleted

## 2018-01-31 DIAGNOSIS — C61 Malignant neoplasm of prostate: Secondary | ICD-10-CM

## 2018-01-31 MED ORDER — ENZALUTAMIDE 40 MG PO CAPS: 160.0000 mg | ORAL_CAPSULE | Freq: Every day | ORAL | 0 refills | Status: DC

## 2018-02-01 DIAGNOSIS — R69 Illness, unspecified: Secondary | ICD-10-CM | POA: Diagnosis not present

## 2018-02-13 ENCOUNTER — Telehealth: Payer: Self-pay

## 2018-02-13 NOTE — Telephone Encounter (Signed)
Called patient this morning to verify upcoming appointments, on 4/2 for lab and CT's. Per 4/1 phone message returns.

## 2018-02-14 ENCOUNTER — Ambulatory Visit (HOSPITAL_COMMUNITY)
Admission: RE | Admit: 2018-02-14 | Discharge: 2018-02-14 | Disposition: A | Discharge status: Home / Self Care | Payer: Medicare HMO | Source: Ambulatory Visit | Attending: Oncology | Admitting: Oncology

## 2018-02-14 ENCOUNTER — Other Ambulatory Visit: Payer: Medicare HMO

## 2018-02-14 ENCOUNTER — Inpatient Hospital Stay: Payer: Medicare HMO | Attending: Oncology

## 2018-02-14 ENCOUNTER — Encounter (HOSPITAL_COMMUNITY)
Admission: RE | Admit: 2018-02-14 | Discharge: 2018-02-14 | Disposition: A | Discharge status: Home / Self Care | Payer: Medicare HMO | Source: Ambulatory Visit | Attending: Oncology | Admitting: Oncology

## 2018-02-14 ENCOUNTER — Encounter (HOSPITAL_COMMUNITY): Payer: Self-pay

## 2018-02-14 DIAGNOSIS — I10 Essential (primary) hypertension: Secondary | ICD-10-CM | POA: Diagnosis not present

## 2018-02-14 DIAGNOSIS — C412 Malignant neoplasm of vertebral column: Secondary | ICD-10-CM | POA: Diagnosis not present

## 2018-02-14 DIAGNOSIS — C61 Malignant neoplasm of prostate: Secondary | ICD-10-CM

## 2018-02-14 DIAGNOSIS — R911 Solitary pulmonary nodule: Secondary | ICD-10-CM | POA: Diagnosis not present

## 2018-02-14 DIAGNOSIS — C7951 Secondary malignant neoplasm of bone: Secondary | ICD-10-CM | POA: Insufficient documentation

## 2018-02-14 DIAGNOSIS — K760 Fatty (change of) liver, not elsewhere classified: Secondary | ICD-10-CM | POA: Diagnosis not present

## 2018-02-14 DIAGNOSIS — M549 Dorsalgia, unspecified: Secondary | ICD-10-CM | POA: Diagnosis not present

## 2018-02-14 DIAGNOSIS — Z79899 Other long term (current) drug therapy: Secondary | ICD-10-CM | POA: Diagnosis not present

## 2018-02-14 HISTORY — DX: Essential (primary) hypertension: I10

## 2018-02-14 LAB — COMPREHENSIVE METABOLIC PANEL
ALT: 69 U/L — ABNORMAL HIGH (ref 0–55)
AST: 36 U/L — ABNORMAL HIGH (ref 5–34)
Albumin: 4 g/dL (ref 3.5–5.0)
Alkaline Phosphatase: 68 U/L (ref 40–150)
Anion gap: 10 (ref 3–11)
BUN: 14 mg/dL (ref 7–26)
CO2: 29 mmol/L (ref 22–29)
Calcium: 9.8 mg/dL (ref 8.4–10.4)
Chloride: 99 mmol/L (ref 98–109)
Creatinine, Ser: 0.85 mg/dL (ref 0.70–1.30)
GFR calc Af Amer: >60 mL/min (ref >60)
GFR calc non Af Amer: >60 mL/min (ref >60)
Glucose, Bld: 116 mg/dL (ref 70–140)
Potassium: 3.4 mmol/L — ABNORMAL LOW (ref 3.5–5.1)
Sodium: 138 mmol/L (ref 136–145)
Total Bilirubin: 0.4 mg/dL (ref 0.2–1.2)
Total Protein: 7.3 g/dL (ref 6.4–8.3)

## 2018-02-14 LAB — CBC WITH DIFFERENTIAL/PLATELET
Basophils Absolute: 0 K/uL (ref 0.0–0.1)
Basophils Relative: 0 %
Eosinophils Absolute: 0.2 K/uL (ref 0.0–0.5)
Eosinophils Relative: 3 %
HCT: 41.9 % (ref 38.4–49.9)
Hemoglobin: 14.4 g/dL (ref 13.0–17.1)
Lymphocytes Relative: 22 %
Lymphs Abs: 1.3 K/uL (ref 0.9–3.3)
MCH: 30.6 pg (ref 27.2–33.4)
MCHC: 34.4 g/dL (ref 32.0–36.0)
MCV: 89.1 fL (ref 79.3–98.0)
Monocytes Absolute: 0.3 K/uL (ref 0.1–0.9)
Monocytes Relative: 5 %
Neutro Abs: 4.1 K/uL (ref 1.5–6.5)
Neutrophils Relative %: 70 %
Platelets: 118 K/uL — ABNORMAL LOW (ref 140–400)
RBC: 4.7 MIL/uL (ref 4.20–5.82)
RDW: 14.8 % — ABNORMAL HIGH (ref 11.0–14.6)
WBC: 5.9 K/uL (ref 4.0–10.3)

## 2018-02-14 MED ORDER — TECHNETIUM TC 99M MEDRONATE IV KIT
21.9000 | PACK | Freq: Once | INTRAVENOUS | Status: AC | PRN
  Administered 2018-02-14: 21.9 via INTRAVENOUS

## 2018-02-14 MED ORDER — IOPAMIDOL (ISOVUE-300) INJECTION 61%
100.0000 mL | Freq: Once | INTRAVENOUS | Status: AC | PRN
  Administered 2018-02-14: 100 mL via INTRAVENOUS

## 2018-02-14 MED ORDER — IOPAMIDOL (ISOVUE-300) INJECTION 61%
INTRAVENOUS | Status: AC
  Filled 2018-02-14: qty 100

## 2018-02-15 ENCOUNTER — Telehealth: Payer: Self-pay

## 2018-02-15 ENCOUNTER — Inpatient Hospital Stay (HOSPITAL_BASED_OUTPATIENT_CLINIC_OR_DEPARTMENT_OTHER): Payer: Medicare HMO | Admitting: Oncology

## 2018-02-15 ENCOUNTER — Encounter: Payer: Self-pay | Admitting: Radiation Oncology

## 2018-02-15 VITALS — BP 160/100 | HR 92 | Temp 98.1°F | Resp 17 | Ht 75.0 in | Wt 360.1 lb

## 2018-02-15 DIAGNOSIS — M549 Dorsalgia, unspecified: Secondary | ICD-10-CM | POA: Diagnosis not present

## 2018-02-15 DIAGNOSIS — C61 Malignant neoplasm of prostate: Secondary | ICD-10-CM | POA: Diagnosis not present

## 2018-02-15 DIAGNOSIS — C7951 Secondary malignant neoplasm of bone: Secondary | ICD-10-CM

## 2018-02-15 DIAGNOSIS — Z79899 Other long term (current) drug therapy: Secondary | ICD-10-CM | POA: Diagnosis not present

## 2018-02-15 DIAGNOSIS — I10 Essential (primary) hypertension: Secondary | ICD-10-CM

## 2018-02-15 LAB — PROSTATE-SPECIFIC AG, SERUM (LABCORP): Prostate Specific Ag, Serum: 2.3 ng/mL (ref 0.0–4.0)

## 2018-02-15 MED ORDER — HYDROCODONE-ACETAMINOPHEN 5-325 MG PO TABS: 1.0000 | ORAL_TABLET | Freq: Four times a day (QID) | ORAL | 0 refills | Status: DC | PRN

## 2018-02-15 NOTE — Progress Notes (Signed)
Hematology and Oncology Follow Up Visit  Gregory Schneider [id number] 05-24-1956 62 y.o. 02/15/2018 11:52 AM   Principle Diagnosis: 62 year old man with castration resistant metastatic prostate cancer with disease to the bone.  He was diagnosed in 2010 with PSA was 15 and Gleason score 4 + 5 = 9.   Prior Therapy:  Combined androgen deprivation with Lupron and Casodex. He had a good response and then developed a rise in the PSA with castrate level testosterone.  He is S/P Bilateral simple orchiectomy done on 12/25/2012.  Provenge started on 03/03/12. Therapy Ended in 03/31/2012.  Zytiga started in 10/2013. Therapy discontinued in January 2018 because of progression of disease.  Current therapy:  Xtandi 160 mg daily started in February 2018.   Interim History:  [name] presents today for a follow-up visit.  He reports a few complaints since the last visit.  He continues to have diffuse arthralgias and myalgias and he takes hydrocodone for his bone pain.  He also reported some slight fatigue and some tiredness.  He has been eating reasonably well and his weight is stable.  He continues to take [facility] and has not reported any recent complications.  He denies any vomiting, abdominal pain or edema.  His performance status and activity level remained unchanged.    He does not report any headaches, blurry vision, syncope or seizures. He does not report any fevers, chills, sweats or weight loss. He does not report any cough, wheezing or hemoptysis. Not report any chest pain, palpitation orthopnea.  He denies any worsening leg edema.  He is not reporting any nausea or vomiting.  He denies any abdominal pain, constipation or changes in bowel habits.  Does not report any epistaxis, petechiae or lymphadenopathy.  He denies any back pain, shoulder pain but does report chronic hip pain.  He does not report any skin rashes or lesions.  He does not report any anxiety or depression.  His remaining review of system is  negative.  Medications: Reviewed today and no changes. Current Outpatient Medications  Medication Sig Dispense Refill  . albuterol (PROVENTIL HFA;VENTOLIN HFA) 108 (90 Base) MCG/ACT inhaler Inhale into the lungs.    Marland Kitchen buPROPion (ZYBAN) 150 MG 12 hr tablet Take 150 mg by mouth.    . calcium carbonate (OS-CAL) 600 MG TABS Take 600 mg by mouth daily.    . chlorhexidine (PERIDEX) 0.12 % solution 2 (two) times daily. as directed  0  . cholecalciferol (VITAMIN D) 1000 UNITS tablet Take 1,000 Units by mouth daily.    . clonazePAM (KLONOPIN) 1 MG tablet Take 1 mg by mouth as needed for anxiety (1-2 tablets daily as needed).     . Cyanocobalamin (RA VITAMIN B-12 TR) 1000 MCG TBCR Take by mouth.    . enzalutamide (XTANDI) 40 MG capsule Take 4 capsules (160 mg total) by mouth daily. 120 capsule 0  . HYDROcodone-acetaminophen (NORCO/VICODIN) 5-325 MG tablet Take 1-2 tablets by mouth every 6 (six) hours as needed. 60 tablet 0  . losartan-hydrochlorothiazide (HYZAAR) 100-25 MG tablet   0  . Melatonin 5 MG CAPS Take by mouth.    . Multiple Vitamin (MULTIVITAMIN) tablet Take 1 tablet by mouth daily.    . sertraline (ZOLOFT) 100 MG tablet Take 200 mg by mouth every morning.      No current facility-administered medications for this visit.     Allergies:  Allergies  Allergen Reactions  . Penicillins Other (See Comments)    Patient stated,"I don't remember what kind  of reaction because I was a kid."    His past medical history, social history updated without changes today.  Physical Exam: Blood pressure (!) 160/100, pulse 92, temperature 98.1 F (36.7 C), temperature source Oral, resp. rate 17, height 6\' 3"  (1.905 m), weight (!) 360 lb 1.6 oz (163.3 kg), SpO2 99 %.   ECOG: 1 General appearance: Well-appearing gentleman appeared comfortable. Head: Atraumatic without abnormalities. Eyes: No scleral icterus. Oropharynx: Oral mucosa without thrush or ulcers. Lymph nodes: Cervical, supraclavicular,  and axillary nodes normal. Heart: Regular rate and rhythm without any murmurs or gallops. Lung: Clear to auscultation without any wheezes or dullness to percussion. Abdomen: Soft, nontender without any rebound or guarding. Musculoskeletal: No joint deformity or effusion. Neurological examination: No motor, sensory deficits. Skin: No rashes or lesions.  Skin appears dry. Psychiatric: Appropriate mood and affect.   Lab Results: Lab Results  Component Value Date   WBC 5.9 02/14/2018   HGB 14.4 02/14/2018   HCT 41.9 02/14/2018   MCV 89.1 02/14/2018   PLT 118 (L) 02/14/2018     Chemistry      Component Value Date/Time   NA 138 02/14/2018 0912   NA 136 11/17/2017 0956   K 3.4 (L) 02/14/2018 0912   K 3.8 11/17/2017 0956   CL 99 02/14/2018 0912   CL 104 03/09/2013 0921   CO2 29 02/14/2018 0912   CO2 28 11/17/2017 0956   BUN 14 02/14/2018 0912   BUN 19.7 11/17/2017 0956   CREATININE 0.85 02/14/2018 0912   CREATININE 0.9 11/17/2017 0956      Component Value Date/Time   CALCIUM 9.8 02/14/2018 0912   CALCIUM 9.5 11/17/2017 0956   ALKPHOS 68 02/14/2018 0912   ALKPHOS 89 11/17/2017 0956   AST 36 (H) 02/14/2018 0912   AST 21 11/17/2017 0956   ALT 69 (H) 02/14/2018 0912   ALT 47 11/17/2017 0956   BILITOT 0.4 02/14/2018 0912   BILITOT 0.39 11/17/2017 0956      Results for BAKER, MORONTA (MRN [id number]) as of 02/15/2018 11:57  Ref. Range 08/17/2017 10:34 11/17/2017 09:55 02/14/2018 09:11  Prostate Specific Ag, Serum Latest Ref Range: 0.0 - 4.0 ng/mL 0.6 0.9 2.3    EXAM: CT CHEST, ABDOMEN, AND PELVIS WITH CONTRAST  TECHNIQUE: Multidetector CT imaging of the chest, abdomen and pelvis was performed following the standard protocol during bolus administration of intravenous contrast.  CONTRAST:  167mL ISOVUE-300 IOPAMIDOL (ISOVUE-300) INJECTION 61%  COMPARISON:  11/17/2016  FINDINGS: Cardiovascular: No acute findings. Aortic and coronary  artery atherosclerosis.  Mediastinum/Lymph Nodes: No masses or pathologically enlarged lymph nodes identified.  Lungs/Pleura: 5 mm pulmonary nodule in the posterior right lower lobe on image 139/6 remains stable. No new or enlarging pulmonary nodules or masses identified. No evidence of pulmonary infiltrate or pleural effusion.  Hepatobiliary: No masses identified. Increased severe diffuse hepatic steatosis since prior study. Gallbladder is unremarkable. No evidence of biliary ductal dilatation.  Pancreas:  No mass or inflammatory changes.  Spleen:  Within normal limits in size and appearance.  Adrenals/Urinary tract: No masses or hydronephrosis. Unremarkable unopacified urinary bladder.  Stomach/Bowel: No evidence of obstruction, inflammatory process, or abnormal fluid collections. Sigmoid diverticulosis is noted, without evidence of diverticulitis.  Vascular/Lymphatic: No pathologically enlarged lymph nodes identified. No abdominal aortic aneurysm. Aortic atherosclerosis.  Reproductive:  No mass or other significant abnormality identified.  Other:  None.  Musculoskeletal: Diffuse sclerotic bone metastases show no significant interval change.  IMPRESSION: No significant change in diffuse sclerotic bone metastases.  No evidence of lymph node or other soft tissue metastatic disease.  Stable 5 mm right lower lobe pulmonary nodule, most likely benign. Recommend continued attention on follow-up CT.  Increased severe hepatic steatosis.  Bone scan: IMPRESSION: Increased numbers and conspicuity of metastatic lesions to the sternum, ribs, spine, and pelvis.    Impression and Plan:    62 year old man with  1.  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  His PSA showed recent increase currently at 2.3.  CT scan and bone scan obtained and reviewed  personally today.  His CT scan showed no visceral metastasis and is sclerotic bony lesions are not changed at this time.  His bone scan does suggest possible progression of disease.  Options of therapy were reviewed today with patient which includes continuing Xtandi for the time being, proceeding with chemotherapy or utilizing Xofigo as an option.  Risks and benefits of all these approaches were reviewed today.  After discussion, he elected to continue with Xtandi and consider adding Xofigo.  Complication associated with this therapy reviewed today briefly including fatigue, myelosuppression and injection related issues.  We will make the appropriate referral to radiation oncology for evaluation.   2. Androgen deprivation. He S/P Bilateral orchiectomy.  Testosterone level remain castrate and needs to be monitored periodically.  3. Bony disease.  He has been off Zometa due to dental complications.  He developed osteonecrosis of the jaw and will not receive this treatment in the future.  4. Hypertension: His blood pressure elevated today and has a follow-up with his primary care physician in the near future.  5. Back pain: Manageable with hydrocodone.  This was refilled for him today.  6. Followup. In 2 months to follow his progress.  25  minutes was spent with the patient face-to-face today.  More than 50% of time was dedicated to patient counseling, education and coordination of the patient's multifaceted care.     Zola Button MD 4/3/201911:52 AM

## 2018-02-15 NOTE — Telephone Encounter (Signed)
Printed avs and calender of upcoming appointment. Per 4/3 los 

## 2018-02-23 DIAGNOSIS — R739 Hyperglycemia, unspecified: Secondary | ICD-10-CM | POA: Diagnosis not present

## 2018-02-23 DIAGNOSIS — C7951 Secondary malignant neoplasm of bone: Secondary | ICD-10-CM | POA: Diagnosis not present

## 2018-02-23 DIAGNOSIS — R69 Illness, unspecified: Secondary | ICD-10-CM | POA: Diagnosis not present

## 2018-02-23 DIAGNOSIS — Z79899 Other long term (current) drug therapy: Secondary | ICD-10-CM | POA: Diagnosis not present

## 2018-02-23 DIAGNOSIS — Z131 Encounter for screening for diabetes mellitus: Secondary | ICD-10-CM | POA: Diagnosis not present

## 2018-02-23 DIAGNOSIS — C61 Malignant neoplasm of prostate: Secondary | ICD-10-CM | POA: Diagnosis not present

## 2018-02-23 DIAGNOSIS — E782 Mixed hyperlipidemia: Secondary | ICD-10-CM | POA: Diagnosis not present

## 2018-02-23 DIAGNOSIS — I10 Essential (primary) hypertension: Secondary | ICD-10-CM | POA: Diagnosis not present

## 2018-02-24 ENCOUNTER — Encounter: Payer: Self-pay | Admitting: Radiation Oncology

## 2018-02-24 NOTE — Progress Notes (Signed)
Histology and Location of Primary Cancer: Castration resistant metastatic prostate cancer with disease to the bone.  He was diagnosed in 2010 with PSA was 15 and Gleason score 4 + 5 = 9.    Sites of Visceral and Bony Metastatic Disease: bony  Location(s) of Symptomatic Metastases: diffuse  Past/Anticipated chemotherapy by medical oncology, if any:  Prior Therapy:  Combined androgen deprivation with Lupron and Casodex. He had a good response and then developed a rise in the PSA with castrate level testosterone.  He is S/P Bilateral simple orchiectomy done on 12/25/2012.  Provenge started on 03/03/12. Therapy Ended in 03/31/2012.  Zytiga started in 10/2013. Therapy discontinued in January 2018 because of progression of disease.  Current therapy:  Xtandi 160 mg daily started in February 2018.   Pain on a scale of 0-10 is:  Patient denies chronic pain. He reports mild intermittent bilateral shoulder and hip pain. Also, patient reports lower back/spine pain. Reports taking hydrocodone/acetaminopen plus motrin at bedtime prn.     If Spine Met(s), symptoms, if any, include:  Bowel/Bladder retention or incontinence (please describe): No. LUTS are unchanged.  Numbness or weakness in extremities (please describe): Reports numbness in toes on both feet.  Current Decadron regimen, if applicable: no  Ambulatory status? Walker? Wheelchair?: Ambulatory  SAFETY ISSUES:  Prior radiation? yes  Pacemaker/ICD? no  Possible current pregnancy? no  Is the patient on methotrexate? no  Current Complaints / other details:  62 year old male.

## 2018-02-27 ENCOUNTER — Encounter: Payer: Self-pay | Admitting: Radiation Oncology

## 2018-02-27 ENCOUNTER — Ambulatory Visit
Admission: RE | Admit: 2018-02-27 | Discharge: 2018-02-27 | Disposition: A | Discharge status: Home / Self Care | Payer: Medicare HMO | Source: Ambulatory Visit | Attending: Radiation Oncology | Admitting: Radiation Oncology

## 2018-02-27 VITALS — BP 146/92 | HR 93 | Temp 98.1°F | Resp 18 | Ht 74.0 in | Wt 355.8 lb

## 2018-02-27 DIAGNOSIS — C7951 Secondary malignant neoplasm of bone: Secondary | ICD-10-CM | POA: Insufficient documentation

## 2018-02-27 DIAGNOSIS — Z79899 Other long term (current) drug therapy: Secondary | ICD-10-CM | POA: Insufficient documentation

## 2018-02-27 DIAGNOSIS — Z803 Family history of malignant neoplasm of breast: Secondary | ICD-10-CM | POA: Diagnosis not present

## 2018-02-27 DIAGNOSIS — F329 Major depressive disorder, single episode, unspecified: Secondary | ICD-10-CM | POA: Insufficient documentation

## 2018-02-27 DIAGNOSIS — Z192 Hormone resistant malignancy status: Secondary | ICD-10-CM | POA: Diagnosis not present

## 2018-02-27 DIAGNOSIS — I10 Essential (primary) hypertension: Secondary | ICD-10-CM | POA: Insufficient documentation

## 2018-02-27 DIAGNOSIS — Z8546 Personal history of malignant neoplasm of prostate: Secondary | ICD-10-CM | POA: Diagnosis not present

## 2018-02-27 DIAGNOSIS — Z87891 Personal history of nicotine dependence: Secondary | ICD-10-CM | POA: Diagnosis not present

## 2018-02-27 DIAGNOSIS — C7952 Secondary malignant neoplasm of bone marrow: Secondary | ICD-10-CM

## 2018-02-27 DIAGNOSIS — Z9289 Personal history of other medical treatment: Secondary | ICD-10-CM | POA: Diagnosis not present

## 2018-02-27 DIAGNOSIS — C61 Malignant neoplasm of prostate: Secondary | ICD-10-CM | POA: Diagnosis not present

## 2018-02-27 DIAGNOSIS — R69 Illness, unspecified: Secondary | ICD-10-CM | POA: Diagnosis not present

## 2018-02-27 NOTE — Progress Notes (Signed)
See progress note under physician encounter. 

## 2018-02-27 NOTE — Progress Notes (Signed)
Radiation Oncology         [phone] ________________________________  Initial Outpatient Consultation  Name: Gregory Schneider MRN: [id number]  Date of Service: 02/27/2018 DOB: 1955-11-25  GD:JMEQAST, Beverely Low, MD  Gregory Portela, MD   REFERRING PHYSICIAN: Wyatt Portela, MD  DIAGNOSIS: 62 y.o. 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    ICD-10-CM   1. Secondary malignant neoplasm of bone and bone marrow (HCC) C79.51    C79.52     HISTORY OF PRESENT ILLNESS: Gregory Schneider is a 62 y.o. male seen at the request of Dr. 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  He was originally diagnosed with prostate cancer in 2010 that was Gleason 4+5 with PSA of 15.  At that time he received combined ADT with Lupron and Casodex.  He had a good response and then developed a rise in the PSA with castrate level testosterone.  He was started on Provenge in April 2013 which ended in May 2013.  He is s/p bilateral simple orchiectomy done on 12/25/2012.  He was started on Zytiga in December 2014 which was discontinued in January 2018 due to progression of disease.  His current therapy is Gillermina Phy started in February 2018.  His PSA has shown a recent increase, currently at 2.3.  His recent CT scan on 02/14/2018 showed no evidence of visceral metastatic disease, and there is no significant change in diffuse sclerotic bone metastases.  His bone scan does suggest possible progression of disease with increased numbers and conspicuity of metastatic lesions to the sternum, ribs, spine, and pelvis.  After discussion of treatment options with Dr. Alen Schneider, the patient elected to continue with [facility] and consider adding Xofigo.  He presents today to discuss the role of radiation treatment options in the management of his disease.  PREVIOUS RADIATION THERAPY: Yes   PAST MEDICAL HISTORY:  Past Medical History:  Diagnosis Date  . Arthritis   . BPH (benign  prostatic hyperplasia)   . Cancer ([place])   . Depression   . Hypertension   . Pneumonia    hx of  . Prostate cancer ([place]) 03/03/2012      PAST SURGICAL HISTORY: Past Surgical History:  Procedure Laterality Date  . HYDROCELE EXCISION Left 12/25/2012   Procedure: HYDROCELECTOMY ADULT;  Surgeon: Dutch Gray, MD;  Location: [facility];  Service: Urology;  Laterality: Left;  LEFT HYDROCELE REPAIR, BILATERAL SIMPLE ORCHIECTOMY   . ORCHIECTOMY Bilateral 12/25/2012   Procedure: ORCHIECTOMY;  Surgeon: Dutch Gray, MD;  Location: [facility];  Service: Urology;  Laterality: Bilateral;    FAMILY HISTORY:  Family History  Problem Relation Age of Onset  . Prostate cancer Paternal Grandfather 54  . Stomach cancer Paternal Grandfather   . Cancer Paternal Aunt 35       breast    SOCIAL HISTORY:  Social History   Socioeconomic History  . Marital status: Married    Spouse name: Not on file  . Number of children: Not on file  . Years of education: Not on file  . Highest education level: Not on file  Occupational History  . Not on file  Social Needs  . Financial resource strain: Not on file  . Food insecurity:    Worry: Not on file    Inability: Not on file  . Transportation needs:    Medical: Not on file    Non-medical: Not on file  Tobacco Use  . Smoking status: Former Smoker  Packs/day: 1.50    Years: 20.00    Pack years: 30.00    Types: Cigarettes    Last attempt to quit: 03/03/1992    Years since quitting: 26.0  . Smokeless tobacco: Current User    Types: Snuff  Substance and Sexual Activity  . Alcohol use: Yes    Comment: 2 a day  . Drug use: Yes    Types: Marijuana    Comment: last night  . Sexual activity: Not Currently  Lifestyle  . Physical activity:    Days per week: Not on file    Minutes per session: Not on file  . Stress: Not on file  Relationships  . Social connections:    Talks on phone: Not on file    Gets together: Not on file    Attends religious service: Not  on file    Active member of club or organization: Not on file    Attends meetings of clubs or organizations: Not on file    Relationship status: Not on file  . Intimate partner violence:    Fear of current or ex partner: Not on file    Emotionally abused: Not on file    Physically abused: Not on file    Forced sexual activity: Not on file  Other Topics Concern  . Not on file  Social History Narrative   Resides in [place].    ALLERGIES: Penicillins  MEDICATIONS:  Current Outpatient Medications  Medication Sig Dispense Refill  . amLODipine (NORVASC) 10 MG tablet Take by mouth.    Marland Kitchen buPROPion (ZYBAN) 150 MG 12 hr tablet Take 150 mg by mouth.    . calcium carbonate (OS-CAL) 600 MG TABS Take 600 mg by mouth daily.    . cholecalciferol (VITAMIN D) 1000 UNITS tablet Take 1,000 Units by mouth daily.    . clonazePAM (KLONOPIN) 1 MG tablet Take 1 mg by mouth as needed for anxiety (1-2 tablets daily as needed).     . Cyanocobalamin (RA VITAMIN B-12 TR) 1000 MCG TBCR Take by mouth.    . enzalutamide (XTANDI) 40 MG capsule Take 4 capsules (160 mg total) by mouth daily. 120 capsule 0  . HYDROcodone-acetaminophen (NORCO/VICODIN) 5-325 MG tablet Take 1-2 tablets by mouth every 6 (six) hours as needed. 60 tablet 0  . losartan-hydrochlorothiazide (HYZAAR) 100-25 MG tablet   0  . Melatonin 5 MG CAPS Take by mouth.    . Multiple Vitamin (MULTIVITAMIN) tablet Take 1 tablet by mouth daily.    . sertraline (ZOLOFT) 100 MG tablet Take 200 mg by mouth every morning.      No current facility-administered medications for this encounter.     REVIEW OF SYSTEMS:  On review of systems, the patient reports that he is doing well overall. He denies any chest pain, shortness of breath, cough, fevers, chills, night sweats, unintended weight changes. He denies any bowel or bladder disturbances, and denies abdominal pain, nausea or vomiting. He denies any back pain or shoulder pain but does report chronic hip pain  and difficulty with lumbar flexion. He reports taking hydrocodone to manage his pain. A complete review of systems is obtained and is otherwise negative.    PHYSICAL EXAM:  Wt Readings from Last 3 Encounters:  02/27/18 (!) 355 lb 12.8 oz (161.4 kg)  02/15/18 (!) 360 lb 1.6 oz (163.3 kg)  11/17/17 (!) 351 lb 11.2 oz (159.5 kg)   Temp Readings from Last 3 Encounters:  02/27/18 98.1 F (36.7 C) (Oral)  02/15/18 98.1 F (36.7 C) (Oral)  11/17/17 97.8 F (36.6 C) (Oral)   BP Readings from Last 3 Encounters:  02/27/18 (!) 146/92  02/15/18 (!) 160/100  11/17/17 (!) 160/96   Pulse Readings from Last 3 Encounters:  02/27/18 93  02/15/18 92  11/17/17 83   Pain Assessment Pain Score: 0-No pain/10  In general this is a well appearing, obese caucasian male in no acute distress. He is alert and oriented x4 and appropriate throughout the examination. HEENT reveals that the patient is normocephalic, atraumatic. EOMs are intact. Skin is intact without any evidence of gross lesions. Cardiopulmonary assessment is negative for acute distress and he exhibits normal effort.     KPS = 90  100 - Normal; no complaints; no evidence of disease. 90   - Able to carry on normal activity; minor signs or symptoms of disease. 80   - Normal activity with effort; some signs or symptoms of disease. 89   - Cares for self; unable to carry on normal activity or to do active work. 60   - Requires occasional assistance, but is able to care for most of his personal needs. 50   - Requires considerable assistance and frequent medical care. 92   - Disabled; requires special care and assistance. 65   - Severely disabled; hospital admission is indicated although death not imminent. 57   - Very sick; hospital admission necessary; active supportive treatment necessary. 10   - Moribund; fatal processes progressing rapidly. 0     - Dead  Karnofsky DA, Abelmann Story, Craver LS and Burchenal [facility] [phone]) The use of the  nitrogen mustards in the palliative treatment of carcinoma: with particular reference to bronchogenic carcinoma Cancer 1 634-56  LABORATORY DATA:  Lab Results  Component Value Date   WBC 5.9 02/14/2018   HGB 14.4 02/14/2018   HCT 41.9 02/14/2018   MCV 89.1 02/14/2018   PLT 118 (L) 02/14/2018   Lab Results  Component Value Date   NA 138 02/14/2018   K 3.4 (L) 02/14/2018   CL 99 02/14/2018   CO2 29 02/14/2018   Lab Results  Component Value Date   ALT 69 (H) 02/14/2018   AST 36 (H) 02/14/2018   ALKPHOS 68 02/14/2018   BILITOT 0.4 02/14/2018     RADIOGRAPHY: Nm Bone Scan Whole Body  Result Date: 02/15/2018 CLINICAL DATA:  3-4 year history of bilateral shoulder common neck, and hip pain. Recent dental work. The patient has a history of prostate malignancy. EXAM: NUCLEAR MEDICINE WHOLE BODY BONE SCAN TECHNIQUE: Whole body anterior and posterior images were obtained approximately 3 hours after intravenous injection of radiopharmaceutical. RADIOPHARMACEUTICALS:  21.9 mCi Technetium-65m MDP IV COMPARISON:  Nuclear bone scan of November 17, 2016 FINDINGS: There is adequate uptake of the radiopharmaceutical by the skeleton. There is adequate soft tissue clearance and renal activity. There are numerous foci of increased uptake noted in the sternum and in anterior and posterior left ribs. These are more numerous and conspicuous than on the previous study. There is increased conspicuity of uptake in the posterior aspect of the mid cervical spine. There is increased uptake in the upper and lower thoracic spine more conspicuous today. There is increased uptake at approximately L1 which is also more conspicuous. Uptake within the sacrum has increased greatest on the right. There are foci of increased uptake in the pubic rami bilaterally which are new. Uptake within the shoulders and ankles and feet is most compatible with degenerative change. IMPRESSION: Increased  numbers and conspicuity of metastatic  lesions to the sternum, ribs, spine, and pelvis. Electronically Signed   By: David  Martinique M.D.   On: 02/15/2018 07:13   Ct Abdomen Pelvis W Contrast  Result Date: 02/14/2018 CLINICAL DATA:  Followup metastatic prostate carcinoma. Ongoing chemotherapy. Shortness of breath and intermittent nausea. EXAM: CT CHEST, ABDOMEN, AND PELVIS WITH CONTRAST TECHNIQUE: Multidetector CT imaging of the chest, abdomen and pelvis was performed following the standard protocol during bolus administration of intravenous contrast. CONTRAST:  122mL ISOVUE-300 IOPAMIDOL (ISOVUE-300) INJECTION 61% COMPARISON:  11/17/2016 FINDINGS: Cardiovascular: No acute findings. Aortic and coronary artery atherosclerosis. Mediastinum/Lymph Nodes: No masses or pathologically enlarged lymph nodes identified. Lungs/Pleura: 5 mm pulmonary nodule in the posterior right lower lobe on image 139/6 remains stable. No new or enlarging pulmonary nodules or masses identified. No evidence of pulmonary infiltrate or pleural effusion. Hepatobiliary: No masses identified. Increased severe diffuse hepatic steatosis since prior study. Gallbladder is unremarkable. No evidence of biliary ductal dilatation. Pancreas:  No mass or inflammatory changes. Spleen:  Within normal limits in size and appearance. Adrenals/Urinary tract: No masses or hydronephrosis. Unremarkable unopacified urinary bladder. Stomach/Bowel: No evidence of obstruction, inflammatory process, or abnormal fluid collections. Sigmoid diverticulosis is noted, without evidence of diverticulitis. Vascular/Lymphatic: No pathologically enlarged lymph nodes identified. No abdominal aortic aneurysm. Aortic atherosclerosis. Reproductive:  No mass or other significant abnormality identified. Other:  None. Musculoskeletal: Diffuse sclerotic bone metastases show no significant interval change. IMPRESSION: No significant change in diffuse sclerotic bone metastases. No evidence of lymph node or other soft tissue  metastatic disease. Stable 5 mm right lower lobe pulmonary nodule, most likely benign. Recommend continued attention on follow-up CT. Increased severe hepatic steatosis. Electronically Signed   By: Earle Gell M.D.   On: 02/14/2018 15:49      IMPRESSION/PLAN: 1.  62 y.o. 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 We discussed the patient's history and reviews the nature of castrate resistant disease. As he has documented bone disease, and visible disease on his last bone scan, he would be a candidate to add Xofigo to his regimen.  We discussed the risks, benefits, short, and long term effects of radiotherapy, and the patient is interested in proceeding, and he will proceed. We reviewed the delivery and logistics and will schedule in the near future to begin his first of 6 injections.  2. Possible genetic predisposition to malignancy. The patient is a candidate for genetic testing given his personal history of prostate cancer and family history of prostate and breast cancer. He was offered referral and is interested in proceeding.     Carola Rhine, [facility]   Page Me    Seen with _____________________________________  Sheral Apley Tammi Klippel, M.D.  This document serves as a record of services personally performed by Tyler Pita, MD and Shona Simpson, PA-C. It was created on their behalf by Rae Lips, a trained medical scribe. The creation of this record is based on the scribe's personal observations and the providers' statements to them. This document has been checked and approved by the attending providers.

## 2018-03-09 ENCOUNTER — Other Ambulatory Visit: Payer: Self-pay | Admitting: *Deleted

## 2018-03-09 DIAGNOSIS — C61 Malignant neoplasm of prostate: Secondary | ICD-10-CM

## 2018-03-09 MED ORDER — ENZALUTAMIDE 40 MG PO CAPS: 160.0000 mg | ORAL_CAPSULE | Freq: Every day | ORAL | 0 refills | Status: DC

## 2018-03-22 ENCOUNTER — Other Ambulatory Visit: Payer: Self-pay | Admitting: Radiation Oncology

## 2018-03-22 ENCOUNTER — Telehealth: Payer: Self-pay | Admitting: *Deleted

## 2018-03-22 DIAGNOSIS — C61 Malignant neoplasm of prostate: Secondary | ICD-10-CM

## 2018-03-22 DIAGNOSIS — C7951 Secondary malignant neoplasm of bone: Principal | ICD-10-CM

## 2018-03-22 NOTE — Telephone Encounter (Signed)
CALLED PATIENT TO INFORM OF LAB AND WEIGHT APPT. FOR 04-06-18 @ 12 PM @ [place], [place]. ON 04-13-18- ARRIVAL TIME - 12:15 PM @ [facility], SPOKE WITH PATIENT AND HE IS AWARE OF THESE APPTS.

## 2018-04-05 ENCOUNTER — Telehealth: Payer: Self-pay | Admitting: *Deleted

## 2018-04-05 ENCOUNTER — Other Ambulatory Visit: Payer: Self-pay | Admitting: Radiation Oncology

## 2018-04-05 DIAGNOSIS — C7952 Secondary malignant neoplasm of bone marrow: Principal | ICD-10-CM

## 2018-04-05 DIAGNOSIS — C7951 Secondary malignant neoplasm of bone: Secondary | ICD-10-CM

## 2018-04-05 NOTE — Telephone Encounter (Signed)
Called patient to remind of labs and weight for 04-06-18 @ 12 pm @ [place], spoke with patient and he is aware of this appt.

## 2018-04-06 ENCOUNTER — Ambulatory Visit
Admission: RE | Admit: 2018-04-06 | Discharge: 2018-04-06 | Disposition: A | Discharge status: Home / Self Care | Payer: Medicare HMO | Source: Ambulatory Visit | Attending: Radiation Oncology | Admitting: Radiation Oncology

## 2018-04-06 DIAGNOSIS — C7952 Secondary malignant neoplasm of bone marrow: Secondary | ICD-10-CM

## 2018-04-06 DIAGNOSIS — C7951 Secondary malignant neoplasm of bone: Secondary | ICD-10-CM | POA: Diagnosis not present

## 2018-04-06 LAB — CBC WITH DIFFERENTIAL (CANCER CENTER ONLY)
Basophils Absolute: 0 10*3/uL (ref 0.0–0.1)
Basophils Relative: 1 %
Eosinophils Absolute: 0.2 10*3/uL (ref 0.0–0.5)
Eosinophils Relative: 3 %
HCT: 40.7 % (ref 38.4–49.9)
Hemoglobin: 13.8 g/dL (ref 13.0–17.1)
Lymphocytes Relative: 27 %
Lymphs Abs: 1.6 10*3/uL (ref 0.9–3.3)
MCH: 30.4 pg (ref 27.2–33.4)
MCHC: 33.9 g/dL (ref 32.0–36.0)
MCV: 89.6 fL (ref 79.3–98.0)
Monocytes Absolute: 0.4 10*3/uL (ref 0.1–0.9)
Monocytes Relative: 7 %
Neutro Abs: 3.7 10*3/uL (ref 1.5–6.5)
Neutrophils Relative %: 62 %
Platelet Count: 123 10*3/uL — ABNORMAL LOW (ref 140–400)
RBC: 4.54 MIL/uL (ref 4.20–5.82)
RDW: 14.6 % (ref 11.0–14.6)
WBC Count: 5.9 10*3/uL (ref 4.0–10.3)

## 2018-04-12 ENCOUNTER — Telehealth: Payer: Self-pay | Admitting: Oncology

## 2018-04-12 ENCOUNTER — Telehealth: Payer: Self-pay | Admitting: *Deleted

## 2018-04-12 ENCOUNTER — Inpatient Hospital Stay: Payer: Medicare HMO | Attending: Oncology | Admitting: Oncology

## 2018-04-12 ENCOUNTER — Inpatient Hospital Stay: Payer: Medicare HMO

## 2018-04-12 DIAGNOSIS — G893 Neoplasm related pain (acute) (chronic): Secondary | ICD-10-CM | POA: Insufficient documentation

## 2018-04-12 DIAGNOSIS — Z79899 Other long term (current) drug therapy: Secondary | ICD-10-CM | POA: Diagnosis not present

## 2018-04-12 DIAGNOSIS — C61 Malignant neoplasm of prostate: Secondary | ICD-10-CM

## 2018-04-12 DIAGNOSIS — I10 Essential (primary) hypertension: Secondary | ICD-10-CM | POA: Insufficient documentation

## 2018-04-12 DIAGNOSIS — Z9221 Personal history of antineoplastic chemotherapy: Secondary | ICD-10-CM

## 2018-04-12 DIAGNOSIS — C7951 Secondary malignant neoplasm of bone: Secondary | ICD-10-CM | POA: Insufficient documentation

## 2018-04-12 LAB — CBC WITH DIFFERENTIAL (CANCER CENTER ONLY)
Basophils Absolute: 0 10*3/uL (ref 0.0–0.1)
Basophils Relative: 0 %
Eosinophils Absolute: 0.2 10*3/uL (ref 0.0–0.5)
Eosinophils Relative: 3 %
HCT: 40.9 % (ref 38.4–49.9)
Hemoglobin: 14.2 g/dL (ref 13.0–17.1)
Lymphocytes Relative: 25 %
Lymphs Abs: 1.5 10*3/uL (ref 0.9–3.3)
MCH: 31.1 pg (ref 27.2–33.4)
MCHC: 34.7 g/dL (ref 32.0–36.0)
MCV: 89.5 fL (ref 79.3–98.0)
Monocytes Absolute: 0.3 10*3/uL (ref 0.1–0.9)
Monocytes Relative: 6 %
Neutro Abs: 3.8 10*3/uL (ref 1.5–6.5)
Neutrophils Relative %: 66 %
Platelet Count: 133 10*3/uL — ABNORMAL LOW (ref 140–400)
RBC: 4.57 MIL/uL (ref 4.20–5.82)
RDW: 14.6 % (ref 11.0–14.6)
WBC Count: 5.8 10*3/uL (ref 4.0–10.3)

## 2018-04-12 LAB — CMP (CANCER CENTER ONLY)
ALT: 58 U/L — ABNORMAL HIGH (ref 0–55)
AST: 31 U/L (ref 5–34)
Albumin: 3.9 g/dL (ref 3.5–5.0)
Alkaline Phosphatase: 67 U/L (ref 40–150)
Anion gap: 10 (ref 3–11)
BUN: 18 mg/dL (ref 7–26)
CO2: 28 mmol/L (ref 22–29)
Calcium: 9.7 mg/dL (ref 8.4–10.4)
Chloride: 101 mmol/L (ref 98–109)
Creatinine: 0.83 mg/dL (ref 0.70–1.30)
GFR, Est AFR Am: >60 mL/min (ref >60)
GFR, Estimated: >60 mL/min (ref >60)
Glucose, Bld: 120 mg/dL (ref 70–140)
Potassium: 3.6 mmol/L (ref 3.5–5.1)
Sodium: 139 mmol/L (ref 136–145)
Total Bilirubin: 0.4 mg/dL (ref 0.2–1.2)
Total Protein: 7.1 g/dL (ref 6.4–8.3)

## 2018-04-12 MED ORDER — HYDROCODONE-ACETAMINOPHEN 5-325 MG PO TABS: 1.0000 | ORAL_TABLET | Freq: Four times a day (QID) | ORAL | 0 refills | Status: DC | PRN

## 2018-04-12 NOTE — Progress Notes (Signed)
Hematology and Oncology Follow Up Visit  Gregory Schneider [id number] December 13, 1955 62 y.o. 04/12/2018 1:58 PM   Principle Diagnosis: 62 year old man with castration-resistant metastatic prostate cancer with disease to the bone documented in 2013.  He was originally diagnosed in 2010 with PSA was 15 and Gleason score 4 + 5 = 9.   Prior Therapy:  Combined androgen deprivation with Lupron and Casodex. He had a good response and then developed a rise in the PSA with castrate level testosterone.  He is S/P Bilateral simple orchiectomy done on 12/25/2012.  Provenge started on 03/03/12. Therapy Ended in 03/31/2012.  Zytiga started in 10/2013. Therapy discontinued in January 2018 because of progression of disease.  Current therapy:  Xtandi 160 mg daily started in February 2018.  Xofigo infusion to start on Apr 13, 2018.  Interim History:  Gregory Schneider is here for a follow-up visit.  Since her last visit, he reports no major changes in his health.  He does report diffuse arthralgias and myalgias which have not changed dramatically.  He does have difficulties ambulating for long distances because of his disease in the bone.  He does take hydrocodone which helped his pain.  His appetite is reasonable although have lost weight some of it is intentionally.  He denies any urination difficulties or pelvic discomfort.   He does not report any headaches, blurry vision, syncope or seizures. He does not report any fevers, chills, sweats or change in his appetite. He does not report any cough, wheezing or hemoptysis. Not report any chest pain, palpitation orthopnea. He is not reporting any nausea or vomiting.  He denies any abdominal pain, constipation or changes in bowel habits.  He denies any hematochezia or melena.  Does not report any epistaxis, petechiae or lymphadenopathy.  He denies any arthralgias or myalgias.  He does not report any skin rashes or lesions.  He does not report any mood changes.  His remaining review of  system is negative.  Medications: Reviewed today and no changes. Current Outpatient Medications  Medication Sig Dispense Refill  . amLODipine (NORVASC) 10 MG tablet Take by mouth.    Marland Kitchen buPROPion (ZYBAN) 150 MG 12 hr tablet Take 150 mg by mouth.    . calcium carbonate (OS-CAL) 600 MG TABS Take 600 mg by mouth daily.    . cholecalciferol (VITAMIN D) 1000 UNITS tablet Take 1,000 Units by mouth daily.    . clonazePAM (KLONOPIN) 1 MG tablet Take 1 mg by mouth as needed for anxiety (1-2 tablets daily as needed).     . Cyanocobalamin (RA VITAMIN B-12 TR) 1000 MCG TBCR Take by mouth.    . enzalutamide (XTANDI) 40 MG capsule Take 4 capsules (160 mg total) by mouth daily. 120 capsule 0  . HYDROcodone-acetaminophen (NORCO/VICODIN) 5-325 MG tablet Take 1-2 tablets by mouth every 6 (six) hours as needed. 60 tablet 0  . losartan-hydrochlorothiazide (HYZAAR) 100-25 MG tablet   0  . Melatonin 5 MG CAPS Take by mouth.    . Multiple Vitamin (MULTIVITAMIN) tablet Take 1 tablet by mouth daily.    . sertraline (ZOLOFT) 100 MG tablet Take 200 mg by mouth every morning.      No current facility-administered medications for this visit.     Allergies:  Allergies  Allergen Reactions  . Penicillins Other (See Comments)    Patient stated,"I don't remember what kind of reaction because I was a kid."    His past medical history, social history updated without changes today.  Physical Exam:  Blood pressure (!) 154/97, pulse 80, temperature 98.4 F (36.9 C), temperature source Oral, resp. rate 20, height 6\' 2"  (1.88 m), weight (!) 354 lb 14.4 oz (161 kg), SpO2 97 %.   ECOG: 1 General appearance: Alert, awake gentleman without distress. Head: Normocephalic without abnormalities. Eyes: Pupils are equal and round reactive to light. Oropharynx: No oral thrush or ulcers. Lymph nodes: No lymphadenopathy noted in the cervical, supraclavicular, or axillary nodes.  Heart: Regular rate and rhythm without any murmurs  or gallops. Lung: Clear without any rhonchi, wheezes or dullness to percussion. Abdomen: Soft nontender without any shifting dullness or ascites. Musculoskeletal: Full range of motion noted in all extremities. Neurological examination: No deficits noted any motor or sensory exam. Skin: No ecchymosis or petechiae.    Lab Results: Lab Results  Component Value Date   WBC 5.8 04/12/2018   HGB 14.2 04/12/2018   HCT 40.9 04/12/2018   MCV 89.5 04/12/2018   PLT 133 (L) 04/12/2018     Chemistry      Component Value Date/Time   NA 138 02/14/2018 0912   NA 136 11/17/2017 0956   K 3.4 (L) 02/14/2018 0912   K 3.8 11/17/2017 0956   CL 99 02/14/2018 0912   CL 104 03/09/2013 0921   CO2 29 02/14/2018 0912   CO2 28 11/17/2017 0956   BUN 14 02/14/2018 0912   BUN 19.7 11/17/2017 0956   CREATININE 0.85 02/14/2018 0912   CREATININE 0.9 11/17/2017 0956      Component Value Date/Time   CALCIUM 9.8 02/14/2018 0912   CALCIUM 9.5 11/17/2017 0956   ALKPHOS 68 02/14/2018 0912   ALKPHOS 89 11/17/2017 0956   AST 36 (H) 02/14/2018 0912   AST 21 11/17/2017 0956   ALT 69 (H) 02/14/2018 0912   ALT 47 11/17/2017 0956   BILITOT 0.4 02/14/2018 0912   BILITOT 0.39 11/17/2017 0956        Impression and Plan:    62 year old man with  1.  Castrate-resistant prostate cancer with disease to the bone documented since 2013.  He has progressed on multiple therapies outlined above.  He remains on Xtandi without any major complications related to this medication.  He has no documented visible metastasis at this time.  I recommended continuing Xtandi for the time being and he will start Xofigo in the near future.  The rationale for using this therapy was discussed today with the patient in detail.  He understands that PSA responses are variable at this time and likely his PSA will go up on this treatment.  The overall benefit have been documented multiple trials including overall survival.  The plan is to  complete 6 months of Xofigo.   2. Androgen deprivation.  His testosterone will be repeated in the future.  He S/P Bilateral orchiectomy and his last testosterone was castrate in November 2017.  3. Bony disease.  He is off Zometa at this time due to dental complications.  He will continue on calcium and vitamin D..  4. Hypertension: His blood pressure is mildly elevated but has been within normal range between visits.  5.  Bone pain: Is rather diffuse in nature and related to his metastatic prostate cancer.  Hydrocodone was refilled for him.  6. Followup. In 3 months.   15  minutes was spent with the patient face-to-face today.  More than 50% of time was dedicated to patient counseling, education and discussing future plan of care.     Zola Button MD 5/29/20191:58 PM

## 2018-04-12 NOTE — Telephone Encounter (Signed)
CALLED PATIENT TO REMIND OF XOFIGO INJ. FOR  04-13-18 - @ 12:45 PM @ [facility], SPOKE WITH PATIENT AND HE IS AWARE OF THIS APPT.

## 2018-04-12 NOTE — Telephone Encounter (Signed)
Appointments scheduled AVS/Calendar printed per 5/29 los °

## 2018-04-13 ENCOUNTER — Encounter (HOSPITAL_COMMUNITY)
Admission: RE | Admit: 2018-04-13 | Discharge: 2018-04-13 | Disposition: A | Discharge status: Home / Self Care | Payer: Medicare HMO | Source: Ambulatory Visit | Attending: Radiation Oncology | Admitting: Radiation Oncology

## 2018-04-13 DIAGNOSIS — C7951 Secondary malignant neoplasm of bone: Secondary | ICD-10-CM | POA: Diagnosis not present

## 2018-04-13 DIAGNOSIS — C61 Malignant neoplasm of prostate: Secondary | ICD-10-CM | POA: Diagnosis present

## 2018-04-13 LAB — PROSTATE-SPECIFIC AG, SERUM (LABCORP): Prostate Specific Ag, Serum: 3.2 ng/mL (ref 0.0–4.0)

## 2018-04-13 MED ORDER — RADIUM RA 223 DICHLORIDE 30 MCCI/ML IV SOLN: 244.6800 | Freq: Once | INTRAVENOUS | Status: DC

## 2018-04-17 ENCOUNTER — Telehealth: Payer: Self-pay | Admitting: *Deleted

## 2018-04-17 ENCOUNTER — Other Ambulatory Visit: Payer: Self-pay | Admitting: Radiation Oncology

## 2018-04-17 DIAGNOSIS — C7951 Secondary malignant neoplasm of bone: Secondary | ICD-10-CM

## 2018-04-17 DIAGNOSIS — C7952 Secondary malignant neoplasm of bone marrow: Principal | ICD-10-CM

## 2018-04-17 DIAGNOSIS — C61 Malignant neoplasm of prostate: Secondary | ICD-10-CM

## 2018-04-17 NOTE — Telephone Encounter (Signed)
CALLED PATIENT TO INFORM OF LABS AND WEIGHT FOR 05-01-18 AND HIS XOFIGO INJ. FOR 05-08-18, LVM FOR A RETURN CALL

## 2018-04-20 ENCOUNTER — Other Ambulatory Visit: Payer: Self-pay | Admitting: *Deleted

## 2018-04-20 DIAGNOSIS — C61 Malignant neoplasm of prostate: Secondary | ICD-10-CM

## 2018-04-20 MED ORDER — ENZALUTAMIDE 40 MG PO CAPS: 160.0000 mg | ORAL_CAPSULE | Freq: Every day | ORAL | 0 refills | Status: DC

## 2018-04-20 NOTE — Telephone Encounter (Signed)
Faxed new script to [facility] for xtandi [phone]

## 2018-04-28 ENCOUNTER — Other Ambulatory Visit: Payer: Self-pay | Admitting: Radiation Oncology

## 2018-04-28 ENCOUNTER — Telehealth: Payer: Self-pay | Admitting: *Deleted

## 2018-04-28 DIAGNOSIS — C7951 Secondary malignant neoplasm of bone: Secondary | ICD-10-CM

## 2018-04-28 DIAGNOSIS — C7952 Secondary malignant neoplasm of bone marrow: Principal | ICD-10-CM

## 2018-04-28 NOTE — Telephone Encounter (Signed)
CALLED PATIENT TO REMIND OF LAB AND WEIGHT APPT. FOR 05-01-18, LVM FOR A RETURN CALL

## 2018-05-01 ENCOUNTER — Ambulatory Visit
Admission: RE | Admit: 2018-05-01 | Discharge: 2018-05-01 | Disposition: A | Discharge status: Home / Self Care | Payer: Medicare HMO | Source: Ambulatory Visit | Attending: Radiation Oncology | Admitting: Radiation Oncology

## 2018-05-01 DIAGNOSIS — C7951 Secondary malignant neoplasm of bone: Secondary | ICD-10-CM

## 2018-05-01 DIAGNOSIS — C7952 Secondary malignant neoplasm of bone marrow: Secondary | ICD-10-CM | POA: Insufficient documentation

## 2018-05-01 LAB — CBC WITH DIFFERENTIAL (CANCER CENTER ONLY)
Basophils Absolute: 0 10*3/uL (ref 0.0–0.1)
Basophils Relative: 1 %
Eosinophils Absolute: 0.1 10*3/uL (ref 0.0–0.5)
Eosinophils Relative: 3 %
HCT: 41.3 % (ref 38.4–49.9)
Hemoglobin: 14 g/dL (ref 13.0–17.1)
Lymphocytes Relative: 26 %
Lymphs Abs: 1.3 10*3/uL (ref 0.9–3.3)
MCH: 30.3 pg (ref 27.2–33.4)
MCHC: 33.9 g/dL (ref 32.0–36.0)
MCV: 89.4 fL (ref 79.3–98.0)
Monocytes Absolute: 0.4 10*3/uL (ref 0.1–0.9)
Monocytes Relative: 8 %
Neutro Abs: 3.1 10*3/uL (ref 1.5–6.5)
Neutrophils Relative %: 62 %
Platelet Count: 115 10*3/uL — ABNORMAL LOW (ref 140–400)
RBC: 4.61 MIL/uL (ref 4.20–5.82)
RDW: 14.5 % (ref 11.0–14.6)
WBC Count: 4.9 10*3/uL (ref 4.0–10.3)

## 2018-05-05 ENCOUNTER — Telehealth: Payer: Self-pay | Admitting: *Deleted

## 2018-05-05 NOTE — Telephone Encounter (Signed)
Called patient to remind of Xofigo Inj. For 05-08-18- arrival time - 11:45 am @ [facility], spoke with patient and he is aware of this injection

## 2018-05-08 ENCOUNTER — Other Ambulatory Visit: Payer: Self-pay | Admitting: *Deleted

## 2018-05-08 ENCOUNTER — Ambulatory Visit (HOSPITAL_COMMUNITY)
Admission: RE | Admit: 2018-05-08 | Discharge: 2018-05-08 | Disposition: A | Discharge status: Home / Self Care | Payer: Medicare HMO | Source: Ambulatory Visit | Attending: Radiation Oncology | Admitting: Radiation Oncology

## 2018-05-08 DIAGNOSIS — C7951 Secondary malignant neoplasm of bone: Secondary | ICD-10-CM | POA: Diagnosis not present

## 2018-05-08 DIAGNOSIS — C61 Malignant neoplasm of prostate: Secondary | ICD-10-CM | POA: Insufficient documentation

## 2018-05-08 MED ORDER — HYDROCODONE-ACETAMINOPHEN 5-325 MG PO TABS: 1.0000 | ORAL_TABLET | Freq: Four times a day (QID) | ORAL | 0 refills | Status: DC | PRN

## 2018-05-08 MED ORDER — RADIUM RA 223 DICHLORIDE 30 MCCI/ML IV SOLN
119.1900 | Freq: Once | INTRAVENOUS | Status: AC
  Administered 2018-05-08: 119.19 via INTRAVENOUS

## 2018-05-09 ENCOUNTER — Telehealth: Payer: Self-pay | Admitting: *Deleted

## 2018-05-09 NOTE — Telephone Encounter (Signed)
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 Dr. Alen Blew usually gives me Hydrocodone and I've been taking Ibuprofen as well. Is this the medication I need to keep taking for neuropathy or is there something better that is not an opiate? Return number is [phone].

## 2018-05-09 NOTE — Telephone Encounter (Signed)
I will address with him next visit.

## 2018-05-10 ENCOUNTER — Other Ambulatory Visit: Payer: Self-pay | Admitting: Radiation Oncology

## 2018-05-10 ENCOUNTER — Telehealth: Payer: Self-pay | Admitting: *Deleted

## 2018-05-10 DIAGNOSIS — C61 Malignant neoplasm of prostate: Secondary | ICD-10-CM

## 2018-05-10 DIAGNOSIS — C7951 Secondary malignant neoplasm of bone: Principal | ICD-10-CM

## 2018-05-10 NOTE — Progress Notes (Signed)
  Radiation Oncology         [phone] ________________________________  Name: Gregory Schneider MRN: [id number]  Date: 05/08/2018  DOB: 07/28/1956  Radium-223 Infusion Note  Diagnosis:  Castration resistant prostate cancer with painful bone involvement  Current Infusion:    2  Planned Infusions:  6  Narrative: [name] presented to nuclear medicine for treatment. His most recent blood counts were reviewed.  He remains a good candidate to proceed with Ra-223.  The patient was situated in an infusion suite with a contact barrier placed under his arm. Intravenous access was established, using sterile technique, and a normal saline infusion from a syringe was started.  Micro-dosimetry:  The prescribed radiation activity was assayed and confirmed to be within specified tolerance.  Special Treatment Procedure - Infusion:  The nuclear medicine technologist and I personally verified the dose activity to be delivered as specified in the written directive, and verified the patient identification via 2 separate methods.  The syringe containing the dose was attached to a 3 way stopcock, and then the valve was opened to the patient, and the dose delivered over a minute. No complications were noted.  The total administered dose was 122.2 microcuries in a volume of 5 cc.   A saline flush of the line and the syringe that contained the isotope was then performed.  The residual radioactivity in the syringe was 3.01 microcuries, so the actual infused isotope activity was 119.19 microcuries.   Pressure was applied to the venipuncture site, and a compression bandage placed.   Radiation Safety personnel were present to perform the discharge survey, as detailed on their documentation.   After a short period of observation, the patient had his IV removed.  Impression:  The patient tolerated his infusion relatively well.  Plan:  The patient will return in one month for ongoing care.      ________________________________  Sheral Apley. Tammi Klippel, M.D.  This document serves as a record of services personally performed by Tyler Pita, MD. It was created on his behalf by Bethann Humble, a trained medical scribe. The creation of this record is based on the scribe's personal observations and the provider's statements to them. This document has been checked and approved by the attending provider.

## 2018-05-10 NOTE — Telephone Encounter (Signed)
CALLED PATIENT TO INFORM OF LAB AND WEIGHT FOR 06-05-18 @ 12 PM @ [place]. FOR 06-12-18 - ARRIVAL TIME - 11:45 AM @ [facility], SPOKE WITH PATIENT AND HE IS AWARE OF THIS INJ.

## 2018-05-11 NOTE — Telephone Encounter (Signed)
Received voice mail message stating, "I called yesterday regarding my neuropathy in my feet. Dr. Alen Blew said he would address it with me in my next visit but that is not until 07/13/18. Does he want me to keep taking Hydrocodone for two more months? Is there something else that is better?

## 2018-05-12 ENCOUNTER — Other Ambulatory Visit: Payer: Self-pay | Admitting: *Deleted

## 2018-05-12 DIAGNOSIS — C61 Malignant neoplasm of prostate: Secondary | ICD-10-CM

## 2018-05-12 DIAGNOSIS — C7951 Secondary malignant neoplasm of bone: Secondary | ICD-10-CM

## 2018-05-12 MED ORDER — GABAPENTIN 300 MG PO CAPS: 300.0000 mg | ORAL_CAPSULE | Freq: Every day | ORAL | 0 refills | Status: DC

## 2018-05-12 NOTE — Telephone Encounter (Signed)
He can try Neurontin 300 mg at night time (#90). Let us know in 2 weeks if there is any benefits.

## 2018-05-14 ENCOUNTER — Encounter: Payer: Self-pay | Admitting: Radiation Oncology

## 2018-05-14 DIAGNOSIS — C7951 Secondary malignant neoplasm of bone: Secondary | ICD-10-CM

## 2018-05-14 NOTE — Progress Notes (Signed)
  Radiation Oncology         [phone]) [phone] ________________________________  Name: [name] MRN: [id number]  Date: 05/09/18  DOB: 1956/05/05  Radium-223 Infusion Note  Diagnosis:  Castration resistant prostate cancer with painful bone involvement  Current Infusion:    2  Planned Infusions:  6  Narrative: [name] presented to nuclear medicine for treatment to receive the second portion of his second radium 223 infusion. His most recent blood counts were reviewed.  He remains a good candidate to proceed with Ra-223.  The patient was situated in an infusion suite with a contact barrier placed under his arm. Intravenous access was established, using sterile technique, and a normal saline infusion from a syringe was started.  Micro-dosimetry:  The prescribed radiation activity was assayed and confirmed to be within specified tolerance.  Special Treatment Procedure - Infusion:  The nuclear medicine technologist and I personally verified the dose activity to be delivered as specified in the written directive, and verified the patient identification via 2 separate methods.  The syringe containing the dose was attached to a 3 way stopcock, and then the valve was opened to the patient, and the dose delivered over a minute. No complications were noted.  The total administered dose was 114.5 microcuries in a volume of 6.22 cc.  A saline flush of the line and the syringe that contained the isotope was then performed.  The residual radioactivity in the syringe was 2.48 microcuries, so the actual infused isotope activity was 112.02 microcuries.  Cumulative dose from the 2 injections (Monday and Tuesday) was 231.21 microcuries.  Pressure was applied to the venipuncture site, and a compression bandage placed.   Radiation Safety personnel were present to perform the discharge survey, as detailed on their documentation.   After a short period of observation, the patient had his IV removed.  Impression:   The patient tolerated his infusion relatively well.  Plan:  The patient will return in one month for ongoing care.  Prescribed treatment was delivered on 05/09/2018.    -----------------------------------  Blair Promise, PhD, MD

## 2018-05-25 ENCOUNTER — Other Ambulatory Visit: Payer: Self-pay

## 2018-05-25 ENCOUNTER — Telehealth: Payer: Self-pay

## 2018-05-25 DIAGNOSIS — C61 Malignant neoplasm of prostate: Secondary | ICD-10-CM

## 2018-05-25 MED ORDER — ENZALUTAMIDE 40 MG PO CAPS: 160.0000 mg | ORAL_CAPSULE | Freq: Every day | ORAL | 0 refills | Status: DC

## 2018-05-25 NOTE — Telephone Encounter (Signed)
Received request from Frederic with [facility] requesting urgent refill for xtandi. Order refilled and faxed to [phone]. Confirmed fax receipt 05/25/18 at 1446.

## 2018-06-02 ENCOUNTER — Other Ambulatory Visit: Payer: Self-pay | Admitting: Radiation Oncology

## 2018-06-02 ENCOUNTER — Telehealth: Payer: Self-pay | Admitting: *Deleted

## 2018-06-02 DIAGNOSIS — C7951 Secondary malignant neoplasm of bone: Secondary | ICD-10-CM

## 2018-06-02 DIAGNOSIS — C7952 Secondary malignant neoplasm of bone marrow: Principal | ICD-10-CM

## 2018-06-02 NOTE — Telephone Encounter (Signed)
CALLED PATIENT TO REMIND OF LABS AND WEIGHT FOR 06-05-18 - ARRIVAL TIME - 11:45 AM @ [place], SPOKE WITH PATIENT AND HE IS AWARE OF THIS APPT.

## 2018-06-05 ENCOUNTER — Ambulatory Visit
Admission: RE | Admit: 2018-06-05 | Discharge: 2018-06-05 | Disposition: A | Discharge status: Home / Self Care | Payer: Medicare HMO | Source: Ambulatory Visit | Attending: Radiation Oncology | Admitting: Radiation Oncology

## 2018-06-05 DIAGNOSIS — C7951 Secondary malignant neoplasm of bone: Secondary | ICD-10-CM

## 2018-06-05 DIAGNOSIS — C7952 Secondary malignant neoplasm of bone marrow: Secondary | ICD-10-CM | POA: Diagnosis not present

## 2018-06-05 LAB — CBC WITH DIFFERENTIAL (CANCER CENTER ONLY)
Basophils Absolute: 0 10*3/uL (ref 0.0–0.1)
Basophils Relative: 0 %
Eosinophils Absolute: 0.1 10*3/uL (ref 0.0–0.5)
Eosinophils Relative: 2 %
HCT: 38.4 % (ref 38.4–49.9)
Hemoglobin: 13.5 g/dL (ref 13.0–17.1)
Lymphocytes Relative: 22 %
Lymphs Abs: 1.1 10*3/uL (ref 0.9–3.3)
MCH: 31 pg (ref 27.2–33.4)
MCHC: 35.2 g/dL (ref 32.0–36.0)
MCV: 88.3 fL (ref 79.3–98.0)
Monocytes Absolute: 0.4 10*3/uL (ref 0.1–0.9)
Monocytes Relative: 8 %
Neutro Abs: 3.3 10*3/uL (ref 1.5–6.5)
Neutrophils Relative %: 68 %
Platelet Count: 123 10*3/uL — ABNORMAL LOW (ref 140–400)
RBC: 4.35 MIL/uL (ref 4.20–5.82)
RDW: 14.2 % (ref 11.0–14.6)
WBC Count: 4.9 10*3/uL (ref 4.0–10.3)

## 2018-06-09 ENCOUNTER — Telehealth: Payer: Self-pay | Admitting: *Deleted

## 2018-06-09 NOTE — Telephone Encounter (Signed)
Called patient to remind of Xofigo inj. for 06-12-18- arrival time - 11:45 am @ [facility], spoke with patient and he is aware of this appt.

## 2018-06-12 ENCOUNTER — Ambulatory Visit (HOSPITAL_COMMUNITY)
Admission: RE | Admit: 2018-06-12 | Discharge: 2018-06-12 | Disposition: A | Discharge status: Home / Self Care | Payer: Medicare HMO | Source: Ambulatory Visit | Attending: Radiation Oncology | Admitting: Radiation Oncology

## 2018-06-12 DIAGNOSIS — Z510 Encounter for antineoplastic radiation therapy: Secondary | ICD-10-CM | POA: Insufficient documentation

## 2018-06-12 DIAGNOSIS — C7951 Secondary malignant neoplasm of bone: Secondary | ICD-10-CM

## 2018-06-12 DIAGNOSIS — C61 Malignant neoplasm of prostate: Secondary | ICD-10-CM | POA: Diagnosis present

## 2018-06-12 MED ORDER — RADIUM RA 223 DICHLORIDE 30 MCCI/ML IV SOLN
237.0900 | Freq: Once | INTRAVENOUS | Status: AC
  Administered 2018-06-12: 237.09 via INTRAVENOUS

## 2018-06-12 NOTE — Progress Notes (Signed)
  Radiation Oncology         [phone] ________________________________  Name: Gregory Schneider MRN: [id number]  Date: 06/12/2018  DOB: [birth year]  Radium-223 Infusion Note  Diagnosis:  Castration resistant prostate cancer with painful bone involvement  Current Infusion:    3  Planned Infusions:  6  Narrative: [name] presented to nuclear medicine for treatment. His most recent blood counts were reviewed.  He remains a good candidate to proceed with Ra-223.  The patient was situated in an infusion suite with a contact barrier placed under his arm. Intravenous access was established, using sterile technique, and a normal saline infusion from a syringe was started.  Micro-dosimetry:  The prescribed radiation activity was assayed and confirmed to be within specified tolerance.  Special Treatment Procedure - Infusion:  The nuclear medicine technologist and I personally verified the dose activity to be delivered as specified in the written directive, and verified the patient identification via 2 separate methods.  The syringe containing the dose was attached to a 3 way stopcock, and then the valve was opened to the patient, and the dose delivered over a minute. No complications were noted.  The total administered dose was 242.8 microcuries in a volume of 10 cc, in two separate syringes.   A saline flush of the line and the syringe that contained the isotope was then performed.  The residual radioactivity in the syringe was 5.71 microcuries, so the actual infused isotope activity was 237.09 microcuries.   Pressure was applied to the venipuncture site, and a compression bandage placed.   Radiation Safety personnel were present to perform the discharge survey, as detailed on their documentation.   After a short period of observation, the patient had his IV removed.  Impression:  The patient tolerated his infusion relatively well.  Plan:  The patient will return in one month for ongoing care.   ________________________________  Sheral Apley. Tammi Klippel, M.D.  This document serves as a record of services personally performed by Tyler Pita, MD. It was created on his behalf by Rae Lips, a trained medical scribe. The creation of this record is based on the scribe's personal observations and the provider's statements to them. This document has been checked and approved by the attending provider.

## 2018-06-14 ENCOUNTER — Telehealth: Payer: Self-pay | Admitting: *Deleted

## 2018-06-14 ENCOUNTER — Other Ambulatory Visit: Payer: Self-pay | Admitting: Radiation Oncology

## 2018-06-14 DIAGNOSIS — C7951 Secondary malignant neoplasm of bone: Principal | ICD-10-CM

## 2018-06-14 DIAGNOSIS — C61 Malignant neoplasm of prostate: Secondary | ICD-10-CM

## 2018-06-14 NOTE — Telephone Encounter (Signed)
CALLED PATIENT TO INFORM OF LAB AND WEIGHT ON 07-07-18 @ 12 PM @ [place]. ON 07-14-18- ARRIVAL TIME - 11:45 AM @ [facility], SPOKE WITH PATIENT AND HE IS AWARE OF THIS INJ.

## 2018-06-26 ENCOUNTER — Other Ambulatory Visit: Payer: Self-pay | Admitting: *Deleted

## 2018-06-26 DIAGNOSIS — C61 Malignant neoplasm of prostate: Secondary | ICD-10-CM

## 2018-06-26 MED ORDER — ENZALUTAMIDE 40 MG PO CAPS: 160.0000 mg | ORAL_CAPSULE | Freq: Every day | ORAL | 0 refills | Status: DC

## 2018-06-27 ENCOUNTER — Telehealth: Payer: Self-pay | Admitting: *Deleted

## 2018-06-27 NOTE — Telephone Encounter (Signed)
RETURNED PATIENT'S PHONE CALL, LVM FOR A RETURN CALL 

## 2018-06-30 ENCOUNTER — Telehealth: Payer: Self-pay | Admitting: *Deleted

## 2018-06-30 NOTE — Telephone Encounter (Signed)
RETURNED PATIENT'S PHONE CALL, SPOKE WITH PATIENT. ?

## 2018-07-07 ENCOUNTER — Ambulatory Visit: Payer: Medicare HMO

## 2018-07-11 NOTE — Addendum Note (Signed)
Encounter addended by: Valinda Hoar on: 07/11/2018 7:51 AM  Actions taken: Imaging Exam ended

## 2018-07-12 ENCOUNTER — Other Ambulatory Visit: Payer: Self-pay | Admitting: Radiation Oncology

## 2018-07-12 ENCOUNTER — Ambulatory Visit
Admission: RE | Admit: 2018-07-12 | Discharge: 2018-07-12 | Disposition: A | Discharge status: Home / Self Care | Payer: Medicare HMO | Source: Ambulatory Visit | Attending: Radiation Oncology | Admitting: Radiation Oncology

## 2018-07-12 ENCOUNTER — Telehealth: Payer: Self-pay | Admitting: *Deleted

## 2018-07-12 DIAGNOSIS — C61 Malignant neoplasm of prostate: Secondary | ICD-10-CM | POA: Insufficient documentation

## 2018-07-12 DIAGNOSIS — C7952 Secondary malignant neoplasm of bone marrow: Principal | ICD-10-CM

## 2018-07-12 DIAGNOSIS — C7951 Secondary malignant neoplasm of bone: Secondary | ICD-10-CM | POA: Diagnosis not present

## 2018-07-12 LAB — CBC WITH DIFFERENTIAL (CANCER CENTER ONLY)
Basophils Absolute: 0 10*3/uL (ref 0.0–0.1)
Basophils Relative: 0 %
Eosinophils Absolute: 0.1 10*3/uL (ref 0.0–0.5)
Eosinophils Relative: 1 %
HCT: 39.1 % (ref 38.4–49.9)
Hemoglobin: 13.6 g/dL (ref 13.0–17.1)
Lymphocytes Relative: 17 %
Lymphs Abs: 0.9 10*3/uL (ref 0.9–3.3)
MCH: 31.3 pg (ref 27.2–33.4)
MCHC: 34.6 g/dL (ref 32.0–36.0)
MCV: 90.3 fL (ref 79.3–98.0)
Monocytes Absolute: 0.4 10*3/uL (ref 0.1–0.9)
Monocytes Relative: 8 %
Neutro Abs: 4 10*3/uL (ref 1.5–6.5)
Neutrophils Relative %: 74 %
Platelet Count: 130 10*3/uL — ABNORMAL LOW (ref 140–400)
RBC: 4.34 MIL/uL (ref 4.20–5.82)
RDW: 14.2 % (ref 11.0–14.6)
WBC Count: 5.4 10*3/uL (ref 4.0–10.3)

## 2018-07-12 LAB — CMP (CANCER CENTER ONLY)
ALT: 44 U/L (ref 0–44)
AST: 23 U/L (ref 15–41)
Albumin: 4 g/dL (ref 3.5–5.0)
Alkaline Phosphatase: 84 U/L (ref 38–126)
Anion gap: 9 (ref 5–15)
BUN: 20 mg/dL (ref 6–20)
CO2: 29 mmol/L (ref 22–32)
Calcium: 9.5 mg/dL (ref 8.9–10.3)
Chloride: 100 mmol/L (ref 98–111)
Creatinine: 0.8 mg/dL (ref 0.61–1.24)
GFR, Est AFR Am: >60 mL/min
GFR, Estimated: >60 mL/min
Glucose, Bld: 112 mg/dL — ABNORMAL HIGH (ref 70–99)
Potassium: 3.8 mmol/L (ref 3.5–5.1)
Sodium: 138 mmol/L (ref 135–145)
Total Bilirubin: 0.4 mg/dL (ref 0.3–1.2)
Total Protein: 7.4 g/dL (ref 6.5–8.1)

## 2018-07-12 NOTE — Telephone Encounter (Signed)
CALLED PATIENT TO REMIND OF LABS AND WEIGHT FOR TODAY (07/12/18) @ 12 PM, SPOKE WITH PATIENT AND HE IS AWARE OF THIS APPT.

## 2018-07-13 ENCOUNTER — Telehealth: Payer: Self-pay | Admitting: Oncology

## 2018-07-13 ENCOUNTER — Other Ambulatory Visit: Payer: Medicare HMO

## 2018-07-13 ENCOUNTER — Inpatient Hospital Stay: Payer: Medicare HMO | Attending: Oncology | Admitting: Oncology

## 2018-07-13 VITALS — BP 161/92 | HR 83 | Temp 98.5°F | Resp 18 | Ht 74.0 in | Wt 364.4 lb

## 2018-07-13 DIAGNOSIS — C7951 Secondary malignant neoplasm of bone: Secondary | ICD-10-CM | POA: Diagnosis not present

## 2018-07-13 DIAGNOSIS — I10 Essential (primary) hypertension: Secondary | ICD-10-CM

## 2018-07-13 DIAGNOSIS — G893 Neoplasm related pain (acute) (chronic): Secondary | ICD-10-CM | POA: Diagnosis not present

## 2018-07-13 DIAGNOSIS — C61 Malignant neoplasm of prostate: Secondary | ICD-10-CM

## 2018-07-13 DIAGNOSIS — R110 Nausea: Secondary | ICD-10-CM | POA: Diagnosis not present

## 2018-07-13 DIAGNOSIS — Z79899 Other long term (current) drug therapy: Secondary | ICD-10-CM

## 2018-07-13 LAB — PROSTATE-SPECIFIC AG, SERUM (LABCORP): Prostate Specific Ag, Serum: 4.6 ng/mL — ABNORMAL HIGH (ref 0.0–4.0)

## 2018-07-13 MED ORDER — PROCHLORPERAZINE MALEATE 10 MG PO TABS: 10.0000 mg | ORAL_TABLET | Freq: Four times a day (QID) | ORAL | 3 refills | Status: DC | PRN

## 2018-07-13 MED ORDER — HYDROCODONE-ACETAMINOPHEN 5-325 MG PO TABS: 1.0000 | ORAL_TABLET | Freq: Four times a day (QID) | ORAL | 0 refills | Status: DC | PRN

## 2018-07-13 MED ORDER — GABAPENTIN 300 MG PO CAPS: 300.0000 mg | ORAL_CAPSULE | Freq: Every day | ORAL | 3 refills | Status: DC

## 2018-07-13 NOTE — Progress Notes (Signed)
Hematology and Oncology Follow Up Visit  Gregory Schneider [id number] 24-Dec-1955 62 y.o. 07/13/2018 1:50 PM   Principle Diagnosis: 62 year old man with advanced prostate cancer with disease to the bone diagnosed in 2013.  He developed castration-resistant after initial diagnosis in 2010 with PSA of 15 and Gleason score 4 + 5 = 9.   Prior Therapy:  Combined androgen deprivation with Lupron and Casodex. He had a good response and then developed a rise in the PSA with castrate level testosterone.  He is S/P Bilateral simple orchiectomy done on 12/25/2012.  Provenge started on 03/03/12. Therapy Ended in 03/31/2012.  Zytiga started in 10/2013. Therapy discontinued in January 2018 because of progression of disease.  Current therapy:  Xtandi 160 mg daily started in February 2018.  Xofigo infusion to start on Apr 13, 2018.  He completed 3 out of scheduled 6 infusions.  Interim History:  [name] is here for a follow-up visit.  Since her last visit, he has been receiving Xofigo without any major complications.  He noted some improvement in his bone pain since the start of therapy.  His appetite remain excellent although does report some nausea at times.  He has developed peripheral neuropathy and has used gabapentin which have helped with his neuropathic pain in his lower extremity.  He is able to ambulate better although not able to exercise much.  He denies any falls or syncope.  His quality of life and performance status remain excellent.  He continues to take [facility] and has been well-tolerated.  He denies any complications related to it including dysuria or excessive fatigue.   He does not report any headaches, blurry vision, syncope or seizures.  He denies any dizziness or alteration in mental status.  He does not report any fevers, chills, sweats.He does not report any cough, wheezing or hemoptysis. Not report any chest pain, palpitation orthopnea. He is not reporting any nausea or vomiting.  He denies  any constipation or diarrhea.  He denies any dysphagia or odontophagia.  Does not report any epistaxis, petechiae or lymphadenopathy.  He denies any bone pain or pathological fractures..  He does not report any skin rashes or lesions.  He does not report any anxiety or depression.  His remaining review of system is negative.  Medications: Reviewed today and no changes. Current Outpatient Medications  Medication Sig Dispense Refill  . amLODipine (NORVASC) 10 MG tablet Take by mouth.    Marland Kitchen buPROPion (ZYBAN) 150 MG 12 hr tablet Take 150 mg by mouth.    . calcium carbonate (OS-CAL) 600 MG TABS Take 600 mg by mouth daily.    . cholecalciferol (VITAMIN D) 1000 UNITS tablet Take 1,000 Units by mouth daily.    . clonazePAM (KLONOPIN) 1 MG tablet Take 1 mg by mouth as needed for anxiety (1-2 tablets daily as needed).     . Cyanocobalamin (RA VITAMIN B-12 TR) 1000 MCG TBCR Take by mouth.    . enzalutamide (XTANDI) 40 MG capsule Take 4 capsules (160 mg total) by mouth daily. 120 capsule 0  . gabapentin (NEURONTIN) 300 MG capsule Take 1 capsule (300 mg total) by mouth at bedtime. 90 capsule 0  . HYDROcodone-acetaminophen (NORCO/VICODIN) 5-325 MG tablet Take 1-2 tablets by mouth every 6 (six) hours as needed. 60 tablet 0  . losartan-hydrochlorothiazide (HYZAAR) 100-25 MG tablet   0  . Melatonin 5 MG CAPS Take by mouth.    . Multiple Vitamin (MULTIVITAMIN) tablet Take 1 tablet by mouth daily.    Marland Kitchen  sertraline (ZOLOFT) 100 MG tablet Take 200 mg by mouth every morning.      No current facility-administered medications for this visit.     Allergies:  Allergies  Allergen Reactions  . Penicillins Other (See Comments)    Patient stated,"I don't remember what kind of reaction because I was a kid."    His past medical history, social history updated without changes today.  Physical Exam:  Blood pressure (!) 161/92, pulse 83, temperature 98.5 F (36.9 C), temperature source Oral, resp. rate 18, height 6\' 2"   (1.88 m), weight (!) 364 lb 6.4 oz (165.3 kg), SpO2 98 %.   ECOG: 1   General appearance: Comfortable appearing without any discomfort Head: Normocephalic without any trauma Oropharynx: Mucous membranes are moist and pink without any thrush or ulcers. Eyes: Pupils are equal and round reactive to light. Lymph nodes: No cervical, supraclavicular, inguinal or axillary lymphadenopathy.   Heart:regular rate and rhythm.  S1 and S2 without leg edema. Lung: Clear without any rhonchi or wheezes.  No dullness to percussion. Abdomin: Soft, nontender, nondistended with good bowel sounds.  No hepatosplenomegaly. Musculoskeletal: No joint deformity or effusion.  Full range of motion noted. Neurological: No deficits noted on motor, sensory and deep tendon reflex exam. Skin: No petechial rash or dryness.  Appeared moist.  Psychiatric: Mood and affect appeared appropriate.      Lab Results: Lab Results  Component Value Date   WBC 5.4 07/12/2018   HGB 13.6 07/12/2018   HCT 39.1 07/12/2018   MCV 90.3 07/12/2018   PLT 130 (L) 07/12/2018     Chemistry      Component Value Date/Time   NA 138 07/12/2018 1157   NA 136 11/17/2017 0956   K 3.8 07/12/2018 1157   K 3.8 11/17/2017 0956   CL 100 07/12/2018 1157   CL 104 03/09/2013 0921   CO2 29 07/12/2018 1157   CO2 28 11/17/2017 0956   BUN 20 07/12/2018 1157   BUN 19.7 11/17/2017 0956   CREATININE 0.80 07/12/2018 1157   CREATININE 0.9 11/17/2017 0956      Component Value Date/Time   CALCIUM 9.5 07/12/2018 1157   CALCIUM 9.5 11/17/2017 0956   ALKPHOS 84 07/12/2018 1157   ALKPHOS 89 11/17/2017 0956   AST 23 07/12/2018 1157   AST 21 11/17/2017 0956   ALT 44 07/12/2018 1157   ALT 47 11/17/2017 0956   BILITOT 0.4 07/12/2018 1157   BILITOT 0.39 11/17/2017 0956      Results for URA, HAUSEN (MRN [id number]) as of 07/13/2018 12:27  Ref. Range 02/14/2018 09:11 04/12/2018 13:31 07/12/2018 11:57  Prostate Specific Ag, Serum Latest Ref Range: 0.0 -  4.0 ng/mL 2.3 3.2 4.6 (H)    Impression and Plan:    62 year old man with  1.  Advanced prostate cancer that is currently castrate-resistant  with disease to the bone documented since 2013.    He is currently on Xtandi with a PSA that is rising slowly.  He has also started Jasper and has tolerated it without any complications.  Risks and benefits of continuing these therapies for the time being versus switching to systemic chemotherapy.  For the time being, I have recommended finishing 6 months of Xofigo prior to restaging work-up and consideration for different salvage therapy.   2. Androgen deprivation: He is status post orchiectomy with castrate level testosterone.  3.  Bone directed therapy: He has been on Zometa in the past that has been discontinued because of dental complications.  He remains on calcium and vitamin D.  4. Hypertension: His blood pressure still elevated and continues to follow with his primary care physician regarding this issue.  5.  Bone pain: Related to metastatic cancer to the bone.  I refilled his hydrocodone and Neurontin which have helped with his symptoms.  6.  Nausea: Prescription for Compazine was given to the patient today.  7. Followup. In 3 months.   25  minutes was spent with the patient face-to-face today.  More than 50% of time was dedicated to discussing the natural course of this disease, reviewing laboratory data and managing complications associated with his cancer.     Zola Button MD 8/29/20191:50 PM

## 2018-07-13 NOTE — Telephone Encounter (Signed)
Appts scheduled AVS/Calendar printed per 8/29 los °

## 2018-07-14 ENCOUNTER — Ambulatory Visit (HOSPITAL_COMMUNITY): Payer: Medicare HMO

## 2018-07-18 ENCOUNTER — Telehealth: Payer: Self-pay | Admitting: *Deleted

## 2018-07-18 NOTE — Telephone Encounter (Signed)
CALLED PATIENT TO REMIND OF XOFIGO INJ. FOR 07-19-18- ARRIVAL TIME- 11:15 AM @ [facility], LVM FOR A RETURN CALL

## 2018-07-19 ENCOUNTER — Ambulatory Visit (HOSPITAL_COMMUNITY)
Admission: RE | Admit: 2018-07-19 | Discharge: 2018-07-19 | Disposition: A | Discharge status: Home / Self Care | Payer: Medicare HMO | Source: Ambulatory Visit | Attending: Radiation Oncology | Admitting: Radiation Oncology

## 2018-07-19 DIAGNOSIS — C7951 Secondary malignant neoplasm of bone: Secondary | ICD-10-CM | POA: Insufficient documentation

## 2018-07-19 DIAGNOSIS — C61 Malignant neoplasm of prostate: Secondary | ICD-10-CM | POA: Diagnosis not present

## 2018-07-19 MED ORDER — RADIUM RA 223 DICHLORIDE 30 MCCI/ML IV SOLN
240.8300 | Freq: Once | INTRAVENOUS | Status: AC
  Administered 2018-07-19: 240.83 via INTRAVENOUS

## 2018-07-19 NOTE — Progress Notes (Signed)
  Radiation Oncology         (336) [phone] ________________________________  Name: Gregory Schneider MRN: [id number]  Date: 07/19/2018  DOB: [birth year]  Radium-223 Infusion Note  Diagnosis:  Castration resistant prostate cancer with painful bone involvement  Current Infusion:    4  Planned Infusions:  6  Narrative: Gregory Schneider presented to nuclear medicine for treatment. His most recent blood counts were reviewed.  He remains a good candidate to proceed with Ra-223.  The patient was situated in an infusion suite with a contact barrier placed under his arm. Intravenous access was established, using sterile technique, and a normal saline infusion from a syringe was started.  Micro-dosimetry:  The prescribed radiation activity was assayed and confirmed to be within specified tolerance.  Special Treatment Procedure - Infusion:  The nuclear medicine technologist and I personally verified the dose activity to be delivered as specified in the written directive, and verified the patient identification via 2 separate methods.  The syringe containing the dose was attached to a 3 way stopcock, and then the valve was opened to the patient, and the dose delivered over a minute. No complications were noted.  The total administered dose was 245 microcuries in a volume of 9.0 cc.   A saline flush of the line and the syringe that contained the isotope was then performed.  The residual radioactivity in the syringe was 4.17 microcuries, so the actual infused isotope activity was 4.83 microcuries.   Pressure was applied to the venipuncture site, and a compression bandage placed.   Radiation Safety personnel were present to perform the discharge survey, as detailed on their documentation.   After a short period of observation, the patient had his IV removed.  Impression:  The patient tolerated his infusion relatively well.  Plan:  The patient will return in one month for ongoing care.      ________________________________  Sheral Apley. Tammi Klippel, M.D.  This document serves as a record of services personally performed by Tyler Pita, MD. It was created on his behalf by Arlyce Harman, a trained medical scribe. The creation of this record is based on the scribe's personal observations and the provider's statements to them. This document has been checked and approved by the attending provider.

## 2018-07-21 ENCOUNTER — Other Ambulatory Visit: Payer: Self-pay | Admitting: Radiation Oncology

## 2018-07-21 ENCOUNTER — Telehealth: Payer: Self-pay | Admitting: *Deleted

## 2018-07-21 DIAGNOSIS — C61 Malignant neoplasm of prostate: Secondary | ICD-10-CM

## 2018-07-21 NOTE — Telephone Encounter (Signed)
CALLED PATIENT TO INFORM OF LAB AND WEIGHT FOR 08-11-18 @ 12 PM @ [place]. FOR 08-18-18 - ARRIVAL TIME - 11:15 AM @ [facility], SPOKE WITH PATIENT AND HE IS AWARE OF THESE APPTS.

## 2018-08-01 ENCOUNTER — Other Ambulatory Visit: Payer: Self-pay | Admitting: *Deleted

## 2018-08-01 DIAGNOSIS — C61 Malignant neoplasm of prostate: Secondary | ICD-10-CM

## 2018-08-01 MED ORDER — ENZALUTAMIDE 40 MG PO CAPS: 160.0000 mg | ORAL_CAPSULE | Freq: Every day | ORAL | 0 refills | Status: DC

## 2018-08-10 ENCOUNTER — Other Ambulatory Visit: Payer: Self-pay | Admitting: Radiation Oncology

## 2018-08-10 ENCOUNTER — Telehealth: Payer: Self-pay | Admitting: *Deleted

## 2018-08-10 DIAGNOSIS — C7952 Secondary malignant neoplasm of bone marrow: Principal | ICD-10-CM

## 2018-08-10 DIAGNOSIS — C7951 Secondary malignant neoplasm of bone: Secondary | ICD-10-CM

## 2018-08-10 NOTE — Telephone Encounter (Signed)
CALLED PATIENT TO REMIND OF LABS FOR 08-11-18 - ARRIVAL TIME - 11:45 AM @ [place] AND LAB AND WEIGHT TO BE DONE @ 12 PM @ [place], LVM FOR A RETURN CALL

## 2018-08-11 ENCOUNTER — Ambulatory Visit
Admission: RE | Admit: 2018-08-11 | Discharge: 2018-08-11 | Disposition: A | Discharge status: Home / Self Care | Payer: Medicare HMO | Source: Ambulatory Visit | Attending: Radiation Oncology | Admitting: Radiation Oncology

## 2018-08-11 DIAGNOSIS — C7951 Secondary malignant neoplasm of bone: Secondary | ICD-10-CM | POA: Diagnosis not present

## 2018-08-11 DIAGNOSIS — C61 Malignant neoplasm of prostate: Secondary | ICD-10-CM | POA: Insufficient documentation

## 2018-08-11 DIAGNOSIS — C7952 Secondary malignant neoplasm of bone marrow: Secondary | ICD-10-CM

## 2018-08-11 LAB — CBC WITH DIFFERENTIAL (CANCER CENTER ONLY)
Basophils Absolute: 0 10*3/uL (ref 0.0–0.1)
Basophils Relative: 0 %
Eosinophils Absolute: 0.1 10*3/uL (ref 0.0–0.5)
Eosinophils Relative: 3 %
HCT: 37.5 % — ABNORMAL LOW (ref 38.4–49.9)
Hemoglobin: 13.3 g/dL (ref 13.0–17.1)
Lymphocytes Relative: 19 %
Lymphs Abs: 0.8 10*3/uL — ABNORMAL LOW (ref 0.9–3.3)
MCH: 31.7 pg (ref 27.2–33.4)
MCHC: 35.5 g/dL (ref 32.0–36.0)
MCV: 89.5 fL (ref 79.3–98.0)
Monocytes Absolute: 0.2 10*3/uL (ref 0.1–0.9)
Monocytes Relative: 6 %
Neutro Abs: 3.2 10*3/uL (ref 1.5–6.5)
Neutrophils Relative %: 72 %
Platelet Count: 106 10*3/uL — ABNORMAL LOW (ref 140–400)
RBC: 4.19 MIL/uL — ABNORMAL LOW (ref 4.20–5.82)
RDW: 14.2 % (ref 11.0–14.6)
WBC Count: 4.3 10*3/uL (ref 4.0–10.3)

## 2018-08-17 ENCOUNTER — Telehealth: Payer: Self-pay | Admitting: *Deleted

## 2018-08-17 NOTE — Telephone Encounter (Signed)
Called patient to remind of Xofigo Inj. for 08-18-18- arrival time- 11:15 am @ [facility], spoke with patient and he is aware of this injection

## 2018-08-18 ENCOUNTER — Ambulatory Visit (HOSPITAL_COMMUNITY)
Admission: RE | Admit: 2018-08-18 | Discharge: 2018-08-18 | Disposition: A | Discharge status: Home / Self Care | Payer: Medicare HMO | Source: Ambulatory Visit | Attending: Radiation Oncology | Admitting: Radiation Oncology

## 2018-08-18 DIAGNOSIS — C61 Malignant neoplasm of prostate: Secondary | ICD-10-CM

## 2018-08-18 DIAGNOSIS — C7951 Secondary malignant neoplasm of bone: Secondary | ICD-10-CM | POA: Diagnosis not present

## 2018-08-18 MED ORDER — RADIUM RA 223 DICHLORIDE 30 MCCI/ML IV SOLN
242.9000 | Freq: Once | INTRAVENOUS | Status: AC
  Administered 2018-08-18: 242.9 via INTRAVENOUS

## 2018-08-22 ENCOUNTER — Other Ambulatory Visit: Payer: Self-pay | Admitting: *Deleted

## 2018-08-22 DIAGNOSIS — C61 Malignant neoplasm of prostate: Secondary | ICD-10-CM

## 2018-08-22 DIAGNOSIS — C7951 Secondary malignant neoplasm of bone: Secondary | ICD-10-CM

## 2018-08-22 MED ORDER — HYDROCODONE-ACETAMINOPHEN 5-325 MG PO TABS: 1.0000 | ORAL_TABLET | Freq: Four times a day (QID) | ORAL | 0 refills | Status: DC | PRN

## 2018-08-28 ENCOUNTER — Other Ambulatory Visit: Payer: Self-pay | Admitting: Radiation Oncology

## 2018-08-28 ENCOUNTER — Telehealth: Payer: Self-pay | Admitting: *Deleted

## 2018-08-28 DIAGNOSIS — C61 Malignant neoplasm of prostate: Secondary | ICD-10-CM

## 2018-08-28 NOTE — Telephone Encounter (Signed)
CALLED PATIENT TO INFORM OF LAB AND WEIGHT ON 09-14-18 @ 12 PM @ [place] AND HIS [place].  ON 09-21-18 - ARRIVAL TIME - 2:15 PM @ [facility], SPOKE WITH PATIENT AND HE IS AWARE OF THESE APPTS.

## 2018-09-06 DIAGNOSIS — E782 Mixed hyperlipidemia: Secondary | ICD-10-CM | POA: Diagnosis not present

## 2018-09-06 DIAGNOSIS — Z79899 Other long term (current) drug therapy: Secondary | ICD-10-CM | POA: Diagnosis not present

## 2018-09-06 DIAGNOSIS — Z131 Encounter for screening for diabetes mellitus: Secondary | ICD-10-CM | POA: Diagnosis not present

## 2018-09-12 ENCOUNTER — Other Ambulatory Visit: Payer: Self-pay

## 2018-09-12 DIAGNOSIS — C61 Malignant neoplasm of prostate: Secondary | ICD-10-CM

## 2018-09-12 MED ORDER — ENZALUTAMIDE 40 MG PO CAPS: 160.0000 mg | ORAL_CAPSULE | Freq: Every day | ORAL | 0 refills | Status: DC

## 2018-09-13 ENCOUNTER — Telehealth: Payer: Self-pay | Admitting: *Deleted

## 2018-09-13 NOTE — Telephone Encounter (Signed)
Called patient to remind of lab and weight for 09-14-18 - arrival time - 11:45 am, spoke with patient and he is aware of this appt.

## 2018-09-14 ENCOUNTER — Ambulatory Visit
Admission: RE | Admit: 2018-09-14 | Discharge: 2018-09-14 | Disposition: A | Discharge status: Home / Self Care | Payer: Medicare HMO | Source: Ambulatory Visit | Attending: Radiation Oncology | Admitting: Radiation Oncology

## 2018-09-14 DIAGNOSIS — C7952 Secondary malignant neoplasm of bone marrow: Secondary | ICD-10-CM

## 2018-09-14 DIAGNOSIS — C7951 Secondary malignant neoplasm of bone: Secondary | ICD-10-CM | POA: Diagnosis not present

## 2018-09-14 DIAGNOSIS — C61 Malignant neoplasm of prostate: Secondary | ICD-10-CM | POA: Diagnosis not present

## 2018-09-14 LAB — CBC WITH DIFFERENTIAL (CANCER CENTER ONLY)
Abs Immature Granulocytes: 0.03 10*3/uL (ref 0.00–0.07)
Basophils Absolute: 0 10*3/uL (ref 0.0–0.1)
Basophils Relative: 0 %
Eosinophils Absolute: 0.1 10*3/uL (ref 0.0–0.5)
Eosinophils Relative: 2 %
HCT: 37.9 % — ABNORMAL LOW (ref 39.0–52.0)
Hemoglobin: 13 g/dL (ref 13.0–17.0)
Immature Granulocytes: 1 %
Lymphocytes Relative: 17 %
Lymphs Abs: 0.8 10*3/uL (ref 0.7–4.0)
MCH: 31.3 pg (ref 26.0–34.0)
MCHC: 34.3 g/dL (ref 30.0–36.0)
MCV: 91.1 fL (ref 80.0–100.0)
Monocytes Absolute: 0.4 10*3/uL (ref 0.1–1.0)
Monocytes Relative: 9 %
Neutro Abs: 3.4 10*3/uL (ref 1.7–7.7)
Neutrophils Relative %: 71 %
Platelet Count: 131 10*3/uL — ABNORMAL LOW (ref 150–400)
RBC: 4.16 MIL/uL — ABNORMAL LOW (ref 4.22–5.81)
RDW: 14.6 % (ref 11.5–15.5)
WBC Count: 4.8 10*3/uL (ref 4.0–10.5)
nRBC: 0 % (ref 0.0–0.2)

## 2018-09-20 ENCOUNTER — Telehealth: Payer: Self-pay | Admitting: *Deleted

## 2018-09-20 NOTE — Telephone Encounter (Signed)
Called patient to remind of Xofigo Inj. for 09-21-18- arrival time - 2:15 pm @ [facility], spoke with patient and he is aware of this inj.

## 2018-09-21 ENCOUNTER — Ambulatory Visit (HOSPITAL_COMMUNITY)
Admission: RE | Admit: 2018-09-21 | Discharge: 2018-09-21 | Disposition: A | Discharge status: Home / Self Care | Payer: Medicare HMO | Source: Ambulatory Visit | Attending: Radiation Oncology | Admitting: Radiation Oncology

## 2018-09-21 DIAGNOSIS — C61 Malignant neoplasm of prostate: Secondary | ICD-10-CM | POA: Insufficient documentation

## 2018-09-21 DIAGNOSIS — C7951 Secondary malignant neoplasm of bone: Secondary | ICD-10-CM | POA: Diagnosis not present

## 2018-09-21 MED ORDER — RADIUM RA 223 DICHLORIDE 30 MCCI/ML IV SOLN
238.7700 | Freq: Once | INTRAVENOUS | Status: AC
  Administered 2018-09-21: 238.77 via INTRAVENOUS

## 2018-09-21 NOTE — Progress Notes (Signed)
  Radiation Oncology         (336) [phone] ________________________________  Name: Gregory Schneider MRN: [id number]  Date: 09/21/2018  DOB: 04/20/1956  Radium-223 Infusion Note  Diagnosis:  Castration resistant prostate cancer with painful bone involvement  Current Infusion:    6  Planned Infusions:  6  Narrative: [name] presented to nuclear medicine for treatment. His most recent blood counts were reviewed.  He remains a good candidate to proceed with Ra-223.  The patient was situated in an infusion suite with a contact barrier placed under his arm. Intravenous access was established, using sterile technique, and a normal saline infusion from a syringe was started.  Micro-dosimetry:  The prescribed radiation activity was assayed and confirmed to be within specified tolerance.  Special Treatment Procedure - Infusion:  The nuclear medicine technologist and I personally verified the dose activity to be delivered as specified in the written directive, and verified the patient identification via 2 separate methods.  The syringe containing the dose was attached to a 3 way stopcock, and then the valve was opened to the patient, and the dose delivered over a minute. No complications were noted.  The total administered dose was 123.3 microcuries and 122.5 microcuries in a volume of 5 cc.   A saline flush of the line and the syringe that contained the isotope was then performed.  The residual radioactivity in the syringe was 3.41 microcuries and 3.62 microcuries, so the actual infused isotope activity was 119.89 microcuries and 118.88 microcuries.   Pressure was applied to the venipuncture site, and a compression bandage placed.   Radiation Safety personnel were present to perform the discharge survey, as detailed on their documentation.   After a short period of observation, the patient had his IV removed.  Impression:  The patient tolerated his infusion relatively well.  Plan:  The patient will  return in one month for ongoing care.    ________________________________  Sheral Apley. Tammi Klippel, M.D.  This document serves as a record of services personally performed by Tyler Pita, MD. It was created on his behalf by Rae Lips, a trained medical scribe. The creation of this record is based on the scribe's personal observations and the provider's statements to them. This document has been checked and approved by the attending provider.

## 2018-09-22 DIAGNOSIS — C61 Malignant neoplasm of prostate: Secondary | ICD-10-CM | POA: Diagnosis not present

## 2018-09-28 ENCOUNTER — Telehealth: Payer: Self-pay

## 2018-09-28 NOTE — Telephone Encounter (Signed)
Oral Oncology Patient Advocate Encounter  [employer] faxed me a renewal application for 1427. There are grant funds available for metastatic prostate cancer so I applied for Patient Gregory Schneider (PAF). I was successful at securing a grant with PAF for $6500. This will keep the out of pocket expense for Xtandi at $0. The grant information is as follows and has been shared with [facility].  Approval dates: 09/28/18-09/29/19 ID: [id number] Group: [id number] BIN: [id number] PCN: [id number]   I called the patient and gave him the good news. He will continue getting Xtandi through [employer] thru 11/14/18. In January he will start getting Xtandi at [facility] and use the grant to cover his copay. He verbalized understanding and great appreciation.   [place] Patient [place] Phone [phone] Fax [phone]

## 2018-10-15 NOTE — Addendum Note (Signed)
Encounter addended by: Tyler Pita, MD on: 10/15/2018 3:02 PM  Actions taken: Medication List reviewed, Problem List reviewed, Allergies reviewed, Sign clinical note

## 2018-10-17 ENCOUNTER — Inpatient Hospital Stay: Payer: Medicare HMO | Attending: Oncology

## 2018-10-17 DIAGNOSIS — C7951 Secondary malignant neoplasm of bone: Secondary | ICD-10-CM | POA: Insufficient documentation

## 2018-10-17 DIAGNOSIS — Z79899 Other long term (current) drug therapy: Secondary | ICD-10-CM | POA: Diagnosis not present

## 2018-10-17 DIAGNOSIS — I10 Essential (primary) hypertension: Secondary | ICD-10-CM | POA: Diagnosis not present

## 2018-10-17 DIAGNOSIS — C61 Malignant neoplasm of prostate: Secondary | ICD-10-CM | POA: Insufficient documentation

## 2018-10-17 LAB — CBC WITH DIFFERENTIAL (CANCER CENTER ONLY)
Abs Immature Granulocytes: 0.03 10*3/uL (ref 0.00–0.07)
Basophils Absolute: 0 10*3/uL (ref 0.0–0.1)
Basophils Relative: 0 %
Eosinophils Absolute: 0.1 10*3/uL (ref 0.0–0.5)
Eosinophils Relative: 1 %
HCT: 35.4 % — ABNORMAL LOW (ref 39.0–52.0)
Hemoglobin: 12.3 g/dL — ABNORMAL LOW (ref 13.0–17.0)
Immature Granulocytes: 1 %
Lymphocytes Relative: 15 %
Lymphs Abs: 0.6 10*3/uL — ABNORMAL LOW (ref 0.7–4.0)
MCH: 31.3 pg (ref 26.0–34.0)
MCHC: 34.7 g/dL (ref 30.0–36.0)
MCV: 90.1 fL (ref 80.0–100.0)
Monocytes Absolute: 0.4 10*3/uL (ref 0.1–1.0)
Monocytes Relative: 11 %
Neutro Abs: 2.6 10*3/uL (ref 1.7–7.7)
Neutrophils Relative %: 72 %
Platelet Count: 97 10*3/uL — ABNORMAL LOW (ref 150–400)
RBC: 3.93 MIL/uL — ABNORMAL LOW (ref 4.22–5.81)
RDW: 14.6 % (ref 11.5–15.5)
WBC Count: 3.7 10*3/uL — ABNORMAL LOW (ref 4.0–10.5)
nRBC: 0 % (ref 0.0–0.2)

## 2018-10-17 LAB — CMP (CANCER CENTER ONLY)
ALT: 53 U/L — ABNORMAL HIGH (ref 0–44)
AST: 27 U/L (ref 15–41)
Albumin: 3.8 g/dL (ref 3.5–5.0)
Alkaline Phosphatase: 73 U/L (ref 38–126)
Anion gap: 10 (ref 5–15)
BUN: 20 mg/dL (ref 6–20)
CO2: 27 mmol/L (ref 22–32)
Calcium: 9.1 mg/dL (ref 8.9–10.3)
Chloride: 104 mmol/L (ref 98–111)
Creatinine: 0.94 mg/dL (ref 0.61–1.24)
GFR, Est AFR Am: >60 mL/min (ref >60)
GFR, Estimated: >60 mL/min (ref >60)
Glucose, Bld: 120 mg/dL — ABNORMAL HIGH (ref 70–99)
Potassium: 3.7 mmol/L (ref 3.5–5.1)
Sodium: 141 mmol/L (ref 135–145)
Total Bilirubin: 0.4 mg/dL (ref 0.3–1.2)
Total Protein: 6.9 g/dL (ref 6.5–8.1)

## 2018-10-18 LAB — PROSTATE-SPECIFIC AG, SERUM (LABCORP): Prostate Specific Ag, Serum: 10 ng/mL — ABNORMAL HIGH (ref 0.0–4.0)

## 2018-10-19 ENCOUNTER — Other Ambulatory Visit: Payer: Self-pay | Admitting: *Deleted

## 2018-10-19 ENCOUNTER — Telehealth: Payer: Self-pay | Admitting: Oncology

## 2018-10-19 ENCOUNTER — Inpatient Hospital Stay (HOSPITAL_BASED_OUTPATIENT_CLINIC_OR_DEPARTMENT_OTHER): Payer: Medicare HMO | Admitting: Oncology

## 2018-10-19 VITALS — BP 137/90 | HR 74 | Temp 97.6°F | Resp 17 | Ht 74.0 in | Wt 361.3 lb

## 2018-10-19 DIAGNOSIS — C7951 Secondary malignant neoplasm of bone: Secondary | ICD-10-CM

## 2018-10-19 DIAGNOSIS — Z79899 Other long term (current) drug therapy: Secondary | ICD-10-CM | POA: Diagnosis not present

## 2018-10-19 DIAGNOSIS — I10 Essential (primary) hypertension: Secondary | ICD-10-CM | POA: Diagnosis not present

## 2018-10-19 DIAGNOSIS — C61 Malignant neoplasm of prostate: Secondary | ICD-10-CM

## 2018-10-19 MED ORDER — HYDROCODONE-ACETAMINOPHEN 5-325 MG PO TABS: 1.0000 | ORAL_TABLET | Freq: Four times a day (QID) | ORAL | 0 refills | Status: DC | PRN

## 2018-10-19 NOTE — Progress Notes (Signed)
Hematology and Oncology Follow Up Visit  Gregory Schneider [id number] 08-Feb-1956 62 y.o. 10/19/2018 11:48 AM   Principle Diagnosis: 62 year old man with castration-resistant prostate cancer with disease to the bone diagnosed in 2013.  He presented initially and 2010 with PSA of 15 and Gleason score 4 + 5 = 9.   Prior Therapy:  Combined androgen deprivation with Lupron and Casodex. He had a good response and then developed a rise in the PSA with castrate level testosterone.  He is S/P Bilateral simple orchiectomy done on 12/25/2012.  Provenge started on 03/03/12. Therapy Ended in 03/31/2012.  Zytiga started in 10/2013. Therapy discontinued in January 2018 because of progression of disease. Xofigo infusion to start on Apr 13, 2018.  He completed 6 months of therapy in November 2019.  Current therapy:   Xtandi 160 mg daily started in February 2018.    Interim History:  Gregory Schneider is here for a repeat evaluation.  Since the last visit, he completed 6 months of Xofigo without any major complications.  He reports his bone pain has slightly improved and his spinal flexibility of also improved.  He does report some fatigue and tiredness associated with this treatment but is recovering slowly.  He does report pelvic discomfort but overall his performance status and quality of life remains unchanged.  He denies any neurological deficits or recent hospitalizations.   He does not report any headaches, blurry vision, syncope or seizures.  He denies any lethargy or confusion.  He does not report any fevers, chills, sweats.He does not report any cough, wheezing or hemoptysis. Not report any chest pain, palpitation orthopnea. He is not reporting any nausea or vomiting.  He denies any change in his bowel habits.  He denies any heat or cold intolerance.  Does not report any bleeding or clotting tendency.  He denies any arthralgias or myalgias.  He does not report any ecchymosis or petechiae.  He does not report any  changes in his mood.  His remaining review of system is negative.  Medications: Reviewed today and no changes. Current Outpatient Medications  Medication Sig Dispense Refill  . amLODipine (NORVASC) 10 MG tablet Take by mouth.    Marland Kitchen buPROPion (ZYBAN) 150 MG 12 hr tablet Take 150 mg by mouth.    . calcium carbonate (OS-CAL) 600 MG TABS Take 600 mg by mouth daily.    . cholecalciferol (VITAMIN D) 1000 UNITS tablet Take 1,000 Units by mouth daily.    . clonazePAM (KLONOPIN) 1 MG tablet Take 1 mg by mouth as needed for anxiety (1-2 tablets daily as needed).     . Cyanocobalamin (RA VITAMIN B-12 TR) 1000 MCG TBCR Take by mouth.    . enzalutamide (XTANDI) 40 MG capsule Take 4 capsules (160 mg total) by mouth daily. 120 capsule 0  . gabapentin (NEURONTIN) 300 MG capsule Take 1 capsule (300 mg total) by mouth at bedtime. 90 capsule 3  . HYDROcodone-acetaminophen (NORCO/VICODIN) 5-325 MG tablet Take 1-2 tablets by mouth every 6 (six) hours as needed. 60 tablet 0  . losartan-hydrochlorothiazide (HYZAAR) 100-25 MG tablet   0  . Melatonin 5 MG CAPS Take by mouth.    . Multiple Vitamin (MULTIVITAMIN) tablet Take 1 tablet by mouth daily.    . prochlorperazine (COMPAZINE) 10 MG tablet Take 1 tablet (10 mg total) by mouth every 6 (six) hours as needed for nausea or vomiting. 30 tablet 3  . sertraline (ZOLOFT) 100 MG tablet Take 200 mg by mouth every morning.  No current facility-administered medications for this visit.     Allergies:  Allergies  Allergen Reactions  . Penicillins Other (See Comments)    Patient stated,"I don't remember what kind of reaction because I was a kid."    His past medical history, social history updated without changes today.  Physical Exam:  Blood pressure 137/90, pulse 74, temperature 97.6 F (36.4 C), temperature source Oral, resp. rate 17, height 6\' 2"  (1.88 m), weight (!) 361 lb 4.8 oz (163.9 kg), SpO2 98 %.    ECOG: 1   General appearance: Alert, awake  without any distress. Head: Atraumatic without abnormalities Oropharynx: Without any thrush or ulcers. Eyes: No scleral icterus. Lymph nodes: No lymphadenopathy noted in the cervical, supraclavicular, or axillary nodes Heart:regular rate and rhythm, without any murmurs or gallops.   Lung: Clear to auscultation without any rhonchi, wheezes or dullness to percussion. Abdomin: Soft, nontender without any shifting dullness or ascites. Musculoskeletal: No clubbing or cyanosis. Neurological: No motor or sensory deficits. Skin: No rashes or lesions.       Lab Results: Lab Results  Component Value Date   WBC 3.7 (L) 10/17/2018   HGB 12.3 (L) 10/17/2018   HCT 35.4 (L) 10/17/2018   MCV 90.1 10/17/2018   PLT 97 (L) 10/17/2018     Chemistry      Component Value Date/Time   NA 141 10/17/2018 1117   NA 136 11/17/2017 0956   K 3.7 10/17/2018 1117   K 3.8 11/17/2017 0956   CL 104 10/17/2018 1117   CL 104 03/09/2013 0921   CO2 27 10/17/2018 1117   CO2 28 11/17/2017 0956   BUN 20 10/17/2018 1117   BUN 19.7 11/17/2017 0956   CREATININE 0.94 10/17/2018 1117   CREATININE 0.9 11/17/2017 0956      Component Value Date/Time   CALCIUM 9.1 10/17/2018 1117   CALCIUM 9.5 11/17/2017 0956   ALKPHOS 73 10/17/2018 1117   ALKPHOS 89 11/17/2017 0956   AST 27 10/17/2018 1117   AST 21 11/17/2017 0956   ALT 53 (H) 10/17/2018 1117   ALT 47 11/17/2017 0956   BILITOT 0.4 10/17/2018 1117   BILITOT 0.39 11/17/2017 0956      Results for Gregory Schneider, Gregory Schneider (MRN [id number]) as of 10/19/2018 11:34  Ref. Range 07/12/2018 11:57 10/17/2018 11:17  Prostate Specific Ag, Serum Latest Ref Range: 0.0 - 4.0 ng/mL 4.6 (H) 10.0 (H)     Impression and Plan:    62 year old man with  1.  Castrate-resistant prostate cancer documented in 2013 with disease to the bone.    He is currently on Xtandi which he has tolerated very well although his PSA has been slowly rising.  Options of therapy were reviewed today which  includes continuing Xtandi, switching to systemic chemotherapy at this time.  He will require restaging evaluation with CT scan and bone scan prior to proceeding with any alternative therapy.  Complications associated with chemotherapy was reiterated again.  These complications include nausea, vomiting, myelosuppression, neuropathy, neutropenia and possible neutropenic sepsis.  After discussion today, we have elected to continue with Xtandi for the time being and repeat staging work-up in January 2020.  Depending on these findings we will determine the best course of action.  Pelvic radiation could be also an option if he has localized disease recurrence.   2. Androgen deprivation: His testosterone is castrate level after orchiectomy.  3.  Bone directed therapy: He is currently on calcium and vitamin D supplements.  Zometa has been discontinued  because of dental issues.  4. Hypertension: There is under better control at this time.  5.  Bone pain: Currently on hydrocodone and Neurontin which has improved his pain overall.  6.  Nausea: Manageable at this time with Compazine.  7. Followup. In all of his progress.  25 minutes was spent with the patient face-to-face today.  More than 50% of time was dedicated to reviewing his disease status, laboratory data and discussing treatment options and complications related therapy.    Zola Button MD 12/5/201911:48 AM

## 2018-10-19 NOTE — Telephone Encounter (Signed)
Printed calendar and avs. °

## 2018-10-20 ENCOUNTER — Other Ambulatory Visit: Payer: Self-pay | Admitting: *Deleted

## 2018-10-20 DIAGNOSIS — C61 Malignant neoplasm of prostate: Secondary | ICD-10-CM

## 2018-10-20 MED ORDER — ENZALUTAMIDE 40 MG PO CAPS: 160.0000 mg | ORAL_CAPSULE | Freq: Every day | ORAL | 0 refills | Status: DC

## 2018-10-23 DIAGNOSIS — Z0000 Encounter for general adult medical examination without abnormal findings: Secondary | ICD-10-CM | POA: Diagnosis not present

## 2018-10-23 DIAGNOSIS — Z23 Encounter for immunization: Secondary | ICD-10-CM | POA: Diagnosis not present

## 2018-10-23 DIAGNOSIS — D649 Anemia, unspecified: Secondary | ICD-10-CM | POA: Diagnosis not present

## 2018-10-23 DIAGNOSIS — R739 Hyperglycemia, unspecified: Secondary | ICD-10-CM | POA: Diagnosis not present

## 2018-10-23 DIAGNOSIS — Z6841 Body Mass Index (BMI) 40.0 and over, adult: Secondary | ICD-10-CM | POA: Diagnosis not present

## 2018-11-01 ENCOUNTER — Telehealth: Payer: Self-pay

## 2018-11-01 ENCOUNTER — Telehealth: Payer: Self-pay | Admitting: Pharmacist

## 2018-11-01 DIAGNOSIS — C61 Malignant neoplasm of prostate: Secondary | ICD-10-CM

## 2018-11-01 MED ORDER — ENZALUTAMIDE 40 MG PO CAPS: 160.0000 mg | ORAL_CAPSULE | Freq: Every day | ORAL | 0 refills | Status: DC

## 2018-11-01 NOTE — Telephone Encounter (Signed)
Oral Chemotherapy Pharmacist Encounter   Attempted to reach patient for follow up on oral medication: Xtandi.  No answer. Left VM for patient to call back.  Current enrollment with Xtandi support solutions ends on 11/14/18. Will coordinate with patient to receive Xtandi at the [facility] with support of copayment grant fort Jan 2020 fill. Noted restaging work-up to occur in jan 2020 as well.   Johny Drilling, PharmD, BCPS, BCOP  11/01/2018   12:01 PM Oral Oncology Clinic [phone]

## 2018-11-01 NOTE — Telephone Encounter (Signed)
Oral Oncology Patient Advocate Encounter  Prior Authorization for Gregory Schneider has been approved.    PA# [id number] Effective dates: 11/13/17 through 11/15/19  Oral Oncology Clinic will continue to follow.   [name] [name] Phone [phone] Fax [phone]

## 2018-11-01 NOTE — Telephone Encounter (Signed)
Oral Oncology Patient Advocate Encounter  Received notification from [facility] that prior authorization for Gregory Schneider is required.  PA submitted on CoverMyMeds Key [id number] Status is pending  Oral Oncology Clinic will continue to follow.  [place] Patient [place] Phone [phone] Fax [phone]

## 2018-11-01 NOTE — Telephone Encounter (Signed)
Oral Chemotherapy Pharmacist Encounter  Follow-Up Form  Spoke with patient today to follow up regarding patient's oral chemotherapy medication: Xtandi (enxalutamide) for the treatment of metastatic, castration-resistant prostate cancer, in conjunction with androgen deprivation therapy, until disease progression or unacceptable toxicity.  Original Start date of oral chemotherapy: 11/29/2016  Pt is doing well today  Pt reports 0 tablets/doses of Xtandi 40mg , 4 tablets (160g) by mouth once daily, without regard to food, missed in the last month.   Pt reports the following side effects: manageable fatigue  Pertinent labs reviewed: OK for continued treatemnt. Noted increasing PSA, restaging to be completed in Jan 2020  Other Issues: coordinating medication acquisition to occur at [facility] after Jan 2020 as manufacturer enrollment ends 11/14/2018. Prescription has been e-scribed to the [facility] for January fill. We will work with pharmacy to ship [place] on 1/8 for delivery on 11/23/18.  Patient knows to call the office with questions or concerns. Oral Oncology Clinic will continue to follow.  Johny Drilling, PharmD, BCPS, BCOP  11/01/2018 1:10 PM Oral Oncology Clinic [phone]

## 2018-11-20 DIAGNOSIS — R69 Illness, unspecified: Secondary | ICD-10-CM | POA: Diagnosis not present

## 2018-11-22 MED FILL — XTANDI 40 MG CAPSULE: 40 | 30 days supply | Qty: 120 | Fill #0

## 2018-11-22 NOTE — Telephone Encounter (Signed)
Oral Oncology Patient Advocate Encounter  Confirmed with Hailey that [facility] was shipped on 11/22/18 with a $0 copay using PAF grant.   [place] Patient [place] Phone [phone] Fax [phone]

## 2018-11-24 ENCOUNTER — Telehealth: Payer: Self-pay

## 2018-11-24 NOTE — Telephone Encounter (Addendum)
Oral Oncology Patient Advocate Encounter  I was successful at securing a grant with [name] for $7,300. This will keep the out of pocket expense for Xtandi at $0. The grant information is as follows and has been shared with Gregory Schneider.  Approval dates: 08/26/18-11/24/19 ID: [id number] Group: [id number] BIN: [id number] PCN: [id number]  I called the patient and gave him the good news, he verbalized understanding and great appreciation.   [name] Patient [place] Phone [phone] Fax [phone]

## 2018-11-29 ENCOUNTER — Encounter (HOSPITAL_COMMUNITY)
Admission: RE | Admit: 2018-11-29 | Discharge: 2018-11-29 | Disposition: A | Discharge status: Home / Self Care | Payer: Medicare HMO | Source: Ambulatory Visit | Attending: Oncology | Admitting: Oncology

## 2018-11-29 ENCOUNTER — Inpatient Hospital Stay: Payer: Medicare HMO | Attending: Oncology

## 2018-11-29 ENCOUNTER — Ambulatory Visit (HOSPITAL_COMMUNITY)
Admission: RE | Admit: 2018-11-29 | Discharge: 2018-11-29 | Disposition: A | Discharge status: Home / Self Care | Payer: Medicare HMO | Source: Ambulatory Visit | Attending: Oncology | Admitting: Oncology

## 2018-11-29 DIAGNOSIS — Z79899 Other long term (current) drug therapy: Secondary | ICD-10-CM | POA: Diagnosis not present

## 2018-11-29 DIAGNOSIS — Z9079 Acquired absence of other genital organ(s): Secondary | ICD-10-CM | POA: Insufficient documentation

## 2018-11-29 DIAGNOSIS — C61 Malignant neoplasm of prostate: Secondary | ICD-10-CM

## 2018-11-29 DIAGNOSIS — C7951 Secondary malignant neoplasm of bone: Secondary | ICD-10-CM | POA: Diagnosis not present

## 2018-11-29 DIAGNOSIS — I10 Essential (primary) hypertension: Secondary | ICD-10-CM | POA: Diagnosis not present

## 2018-11-29 LAB — CMP (CANCER CENTER ONLY)
ALT: 46 U/L — ABNORMAL HIGH (ref 0–44)
AST: 25 U/L (ref 15–41)
Albumin: 4.1 g/dL (ref 3.5–5.0)
Alkaline Phosphatase: 81 U/L (ref 38–126)
Anion gap: 13 (ref 5–15)
BUN: 14 mg/dL (ref 6–20)
CO2: 27 mmol/L (ref 22–32)
Calcium: 9.8 mg/dL (ref 8.9–10.3)
Chloride: 97 mmol/L — ABNORMAL LOW (ref 98–111)
Creatinine: 0.85 mg/dL (ref 0.61–1.24)
GFR, Est AFR Am: >60 mL/min (ref >60)
GFR, Estimated: >60 mL/min (ref >60)
Glucose, Bld: 117 mg/dL — ABNORMAL HIGH (ref 70–99)
Potassium: 3.7 mmol/L (ref 3.5–5.1)
Sodium: 137 mmol/L (ref 135–145)
Total Bilirubin: 0.7 mg/dL (ref 0.3–1.2)
Total Protein: 7.8 g/dL (ref 6.5–8.1)

## 2018-11-29 LAB — CBC WITH DIFFERENTIAL (CANCER CENTER ONLY)
Abs Immature Granulocytes: 0.07 10*3/uL (ref 0.00–0.07)
Basophils Absolute: 0 10*3/uL (ref 0.0–0.1)
Basophils Relative: 0 %
Eosinophils Absolute: 0.1 10*3/uL (ref 0.0–0.5)
Eosinophils Relative: 2 %
HCT: 38.5 % — ABNORMAL LOW (ref 39.0–52.0)
Hemoglobin: 13.5 g/dL (ref 13.0–17.0)
Immature Granulocytes: 1 %
Lymphocytes Relative: 14 %
Lymphs Abs: 0.7 10*3/uL (ref 0.7–4.0)
MCH: 31.6 pg (ref 26.0–34.0)
MCHC: 35.1 g/dL (ref 30.0–36.0)
MCV: 90.2 fL (ref 80.0–100.0)
Monocytes Absolute: 0.3 10*3/uL (ref 0.1–1.0)
Monocytes Relative: 6 %
Neutro Abs: 4.1 10*3/uL (ref 1.7–7.7)
Neutrophils Relative %: 77 %
Platelet Count: 134 10*3/uL — ABNORMAL LOW (ref 150–400)
RBC: 4.27 MIL/uL (ref 4.22–5.81)
RDW: 14.5 % (ref 11.5–15.5)
WBC Count: 5.3 10*3/uL (ref 4.0–10.5)
nRBC: 0 % (ref 0.0–0.2)

## 2018-11-29 MED ORDER — TECHNETIUM TC 99M MEDRONATE IV KIT: 22.0000 | PACK | Freq: Once | INTRAVENOUS | Status: DC | PRN

## 2018-11-29 MED ORDER — SODIUM CHLORIDE (PF) 0.9 % IJ SOLN
INTRAMUSCULAR | Status: AC
  Filled 2018-11-29: qty 50

## 2018-11-29 MED ORDER — IOHEXOL 300 MG/ML  SOLN
100.0000 mL | Freq: Once | INTRAMUSCULAR | Status: AC | PRN
  Administered 2018-11-29: 100 mL via INTRAVENOUS

## 2018-11-30 LAB — PROSTATE-SPECIFIC AG, SERUM (LABCORP): Prostate Specific Ag, Serum: 13.9 ng/mL — ABNORMAL HIGH (ref 0.0–4.0)

## 2018-11-30 LAB — TESTOSTERONE: Testosterone: 11 ng/dL — ABNORMAL LOW (ref 264–916)

## 2018-12-01 ENCOUNTER — Inpatient Hospital Stay (HOSPITAL_BASED_OUTPATIENT_CLINIC_OR_DEPARTMENT_OTHER): Payer: Medicare HMO | Admitting: Oncology

## 2018-12-01 ENCOUNTER — Telehealth: Payer: Self-pay

## 2018-12-01 VITALS — BP 151/95 | HR 83 | Temp 97.9°F | Resp 17 | Ht 74.0 in | Wt 358.7 lb

## 2018-12-01 DIAGNOSIS — I10 Essential (primary) hypertension: Secondary | ICD-10-CM

## 2018-12-01 DIAGNOSIS — Z9079 Acquired absence of other genital organ(s): Secondary | ICD-10-CM | POA: Diagnosis not present

## 2018-12-01 DIAGNOSIS — C7951 Secondary malignant neoplasm of bone: Secondary | ICD-10-CM

## 2018-12-01 DIAGNOSIS — Z79899 Other long term (current) drug therapy: Secondary | ICD-10-CM | POA: Diagnosis not present

## 2018-12-01 DIAGNOSIS — C61 Malignant neoplasm of prostate: Secondary | ICD-10-CM

## 2018-12-01 MED ORDER — HYDROCODONE-ACETAMINOPHEN 5-325 MG PO TABS: 1.0000 | ORAL_TABLET | Freq: Four times a day (QID) | ORAL | 0 refills | Status: DC | PRN

## 2018-12-01 NOTE — Telephone Encounter (Signed)
Printed avs and calender of upcoming appointment. Per 11/7 los 

## 2018-12-01 NOTE — Progress Notes (Signed)
Hematology and Oncology Follow Up Visit  Gregory Schneider [id number] 13-Oct-1956 63 y.o. 12/01/2018 3:28 PM   Principle Diagnosis: 63 year old man with advanced prostate cancer with disease to the bone diagnosed in 2013.  He presented with PSA of 15 and Gleason score 4 + 5 = 9 in 2010.  He has castration resistant disease.   Prior Therapy:  Combined androgen deprivation with Lupron and Casodex. He had a good response and then developed a rise in the PSA with castrate level testosterone.  He is S/P Bilateral simple orchiectomy done on 12/25/2012.  Provenge started on 03/03/12. Therapy Ended in 03/31/2012.  Zytiga started in 10/2013. Therapy discontinued in January 2018 because of progression of disease. Xofigo infusion to start on Apr 13, 2018.  He completed 6 months of therapy in November 2019.  Current therapy:   Xtandi 160 mg daily started in February 2018.    Interim History:  Gregory Schneider returns today for a repeat evaluation.  Since the last visit, he reports no major complications or health changes.  He continues to tolerate Xtandi without any recent complaints.  He does have some pelvic discomfort and frequent urination.  He denies any hematuria or dysuria.  He does report fatigue which has improved since discontinuation of Xofigo.  His appetite is excellent and his performance status is unchanged.  Does report chronic pelvic discomfort and arthralgias that is managed by hydrocodone.   He does not report any headaches, blurry vision, syncope or seizures.  He denies any alteration in mental status or lethargy.  He does not report any fevers, chills, sweats.He does not report any cough, wheezing or hemoptysis. Not report any chest pain, palpitation orthopnea. He is not reporting any nausea or vomiting.  He denies any constipation or diarrhea.  Denies any ecchymosis or easy bruising he denies any bone pain or pathological fractures..  He does not report any lymphadenopathy.  He does not report any  anxiety or depression.  His remaining review of system is negative.  Medications: Reviewed today and no changes. Current Outpatient Medications  Medication Sig Dispense Refill  . amLODipine (NORVASC) 10 MG tablet Take by mouth.    Marland Kitchen buPROPion (ZYBAN) 150 MG 12 hr tablet Take 150 mg by mouth.    . calcium carbonate (OS-CAL) 600 MG TABS Take 600 mg by mouth daily.    . cholecalciferol (VITAMIN D) 1000 UNITS tablet Take 1,000 Units by mouth daily.    . clonazePAM (KLONOPIN) 1 MG tablet Take 1 mg by mouth as needed for anxiety (1-2 tablets daily as needed).     . Cyanocobalamin (RA VITAMIN B-12 TR) 1000 MCG TBCR Take by mouth.    . enzalutamide (XTANDI) 40 MG capsule Take 4 capsules (160 mg total) by mouth daily. 120 capsule 0  . gabapentin (NEURONTIN) 300 MG capsule Take 1 capsule (300 mg total) by mouth at bedtime. 90 capsule 3  . HYDROcodone-acetaminophen (NORCO/VICODIN) 5-325 MG tablet Take 1-2 tablets by mouth every 6 (six) hours as needed. 90 tablet 0  . losartan-hydrochlorothiazide (HYZAAR) 100-25 MG tablet   0  . Melatonin 5 MG CAPS Take by mouth.    . Multiple Vitamin (MULTIVITAMIN) tablet Take 1 tablet by mouth daily.    . prochlorperazine (COMPAZINE) 10 MG tablet Take 1 tablet (10 mg total) by mouth every 6 (six) hours as needed for nausea or vomiting. 30 tablet 3  . sertraline (ZOLOFT) 100 MG tablet Take 200 mg by mouth every morning.  No current facility-administered medications for this visit.    Facility-Administered Medications Ordered in Other Visits  Medication Dose Route Frequency Provider Last Rate Last Dose  . technetium medronate (TC-MDP) injection 22 millicurie  22 millicurie Intravenous Once PRN Entrikin, Camelia Phenes, MD        Allergies:  Allergies  Allergen Reactions  . Penicillins Other (See Comments)    Patient stated,"I don't remember what kind of reaction because I was a kid."    His past medical history, social history updated without changes  today.  Physical Exam:  Blood pressure (!) 151/95, pulse 83, temperature 97.9 F (36.6 C), temperature source Oral, resp. rate 17, height 6\' 2"  (1.88 m), weight (!) 358 lb 11.2 oz (162.7 kg), SpO2 98 %.    ECOG: 1   General appearance: Comfortable appearing without any discomfort Head: Normocephalic without any trauma Oropharynx: Mucous membranes are moist and pink without any thrush or ulcers. Eyes: Pupils are equal and round reactive to light. Lymph nodes: No cervical, supraclavicular, inguinal or axillary lymphadenopathy.   Heart:regular rate and rhythm.  S1 and S2 without leg edema. Lung: Clear without any rhonchi or wheezes.  No dullness to percussion. Abdomin: Soft, nontender, nondistended with good bowel sounds.  No hepatosplenomegaly. Musculoskeletal: No joint deformity or effusion.  Full range of motion noted. Neurological: No deficits noted on motor, sensory and deep tendon reflex exam. Skin: No petechial rash or dryness.  Appeared moist.         Lab Results: Lab Results  Component Value Date   WBC 5.3 11/29/2018   HGB 13.5 11/29/2018   HCT 38.5 (L) 11/29/2018   MCV 90.2 11/29/2018   PLT 134 (L) 11/29/2018     Chemistry      Component Value Date/Time   NA 137 11/29/2018 1109   NA 136 11/17/2017 0956   K 3.7 11/29/2018 1109   K 3.8 11/17/2017 0956   CL 97 (L) 11/29/2018 1109   CL 104 03/09/2013 0921   CO2 27 11/29/2018 1109   CO2 28 11/17/2017 0956   BUN 14 11/29/2018 1109   BUN 19.7 11/17/2017 0956   CREATININE 0.85 11/29/2018 1109   CREATININE 0.9 11/17/2017 0956      Component Value Date/Time   CALCIUM 9.8 11/29/2018 1109   CALCIUM 9.5 11/17/2017 0956   ALKPHOS 81 11/29/2018 1109   ALKPHOS 89 11/17/2017 0956   AST 25 11/29/2018 1109   AST 21 11/17/2017 0956   ALT 46 (H) 11/29/2018 1109   ALT 47 11/17/2017 0956   BILITOT 0.7 11/29/2018 1109   BILITOT 0.39 11/17/2017 0956      EXAM: CT ABDOMEN AND PELVIS WITH  CONTRAST  TECHNIQUE: Multidetector CT imaging of the abdomen and pelvis was performed using the standard protocol following bolus administration of intravenous contrast.  CONTRAST:  122mL OMNIPAQUE IOHEXOL 300 MG/ML  SOLN  COMPARISON:  02/14/2018 and 11/17/2016.  FINDINGS: Lower chest: 5 mm right (series 6, image 29) lower lobe nodule, unchanged from 11/17/2016 and considered benign. Heart size normal. No pericardial or pleural effusion. Distal esophagus is grossly unremarkable.  Hepatobiliary: Liver is decreased in attenuation diffusely with probable areas of peripheral sparing. Gallbladder is unremarkable. No biliary ductal dilatation.  Pancreas: Negative.  Spleen: Negative.  Adrenals/Urinary Tract: Adrenal glands and kidneys are unremarkable. Ureters are decompressed. Bladder may be slightly thick-walled.  Stomach/Bowel: Stomach, small bowel, appendix and colon are unremarkable.  Vascular/Lymphatic: Atherosclerotic calcification of the aorta without aneurysm. No pathologically enlarged lymph nodes.  Reproductive:  Prostate is visualized and indents the bladder.  Other: Small bilateral inguinal hernias contain fat. No free fluid. Mesenteries and peritoneum are unremarkable.  Musculoskeletal: Sclerotic lesions are seen throughout the osseous structures. A fracture of the right posterolateral ninth rib is new from 02/14/2018. Associated callus formation is indicative of a remote injury.  IMPRESSION: 1. Osseous metastatic disease is grossly stable. 2. Mildly enlarged prostate which indents the bladder. Slight bladder wall thickening may be due to outlet obstruction. 3. Hepatic steatosis. 4.  Aortic atherosclerosis (ICD10-170.0).   EXAM: NUCLEAR MEDICINE WHOLE BODY BONE SCAN  TECHNIQUE: Whole body anterior and posterior images were obtained approximately 3 hours after intravenous injection of radiopharmaceutical.  RADIOPHARMACEUTICALS:  22 mCi  Technetium-59m MDP IV  COMPARISON:  02/14/2018  Radiographic correlation: CT abdomen and pelvis 11/29/2018  FINDINGS: Abnormal foci of increased tracer accumulation are seen within the pelvis, proximal RIGHT femur, and anterior LEFT ribs consistent with osseous metastases.  Probable metastatic focus at RIGHT T11 vertebral body.  Additional foci of uptake seen previously within the spine, pelvis, and LEFT ribs are either less conspicuous or no longer visualized.  New focus of increased tracer localization at the posterior RIGHT ninth rib corresponding to a fracture on CT.  Likely degenerative type uptake at the shoulders, sternoclavicular joints, and hips.  Question posttraumatic or degenerative type uptake at the RIGHT ankle, new.  No new sites of worrisome osseous tracer accumulation are seen.  Expected urinary tract and soft tissue distribution of tracer.  IMPRESSION: Osseous metastatic disease, with fever sites identified on the current exam.  No definite new sites of uptake are identified to suggest osseous metastases.  New uptake at posterior RIGHT ninth rib corresponding to fracture on CT.  New uptake at the RIGHT ankle, favor traumatic less likely degenerative in etiology; recommend correlation with history and potentially radiographs if indicated.     Impression and Plan:    63 year old man with  1.  Advanced prostate cancer with disease to the bone that is currently castrate-resistant diagnosed in 2013.  He is status post therapy outlined above and currently on Xtandi with a slow rise in his PSA.  CT scan and bone scan obtained in January 2015 were personally reviewed today and discussed with the patient.  His imaging studies do not show any evidence of progressing metastatic disease but possibly worsening local disease with enlargement of his prostate.  Treatment options were reviewed today which include switching to systemic  chemotherapy versus addressing his disease locally.  He has not received any definitive therapy for his prostate and might benefit from radiation therapy.  The benefit would be to relieve his symptoms as well as control his disease at this time.  I will refer him back to Dr. Tammi Klippel to kindly evaluate this possibility.   2. Androgen deprivation: He is status post orchiectomy and no additional androgen deprivation is needed.  3.  Bone directed therapy: He remains on calcium and vitamin D supplements.  Zometa has been discontinued based on his wishes and dental issues.  4. Hypertension: we will continue to monitor his blood pressure on Xtandi.  Remains under control.   5.  Bone pain: Hydrocodone has been effective in treating his disease which will be refilled for him today.  6. Follow up: We will be in 7 weeks to evaluate his progress.  25 minutes was spent with the patient face-to-face today.  More than 50% of time was dedicated to updating his disease status, reviewing imaging studies, treatment options  and answering questions regarding future plan of care.Zola Button MD 1/17/20203:28 PM

## 2018-12-12 ENCOUNTER — Ambulatory Visit
Admission: RE | Admit: 2018-12-12 | Discharge: 2018-12-12 | Disposition: A | Discharge status: Home / Self Care | Payer: Medicare HMO | Source: Ambulatory Visit | Attending: Radiation Oncology | Admitting: Radiation Oncology

## 2018-12-12 ENCOUNTER — Other Ambulatory Visit: Payer: Self-pay

## 2018-12-12 ENCOUNTER — Encounter: Payer: Self-pay | Admitting: Urology

## 2018-12-12 ENCOUNTER — Ambulatory Visit
Admission: RE | Admit: 2018-12-12 | Discharge: 2018-12-12 | Disposition: A | Discharge status: Home / Self Care | Payer: Medicare HMO | Source: Ambulatory Visit | Attending: Urology | Admitting: Urology

## 2018-12-12 VITALS — BP 152/91 | HR 87 | Temp 98.2°F | Resp 18 | Ht 75.0 in | Wt 353.1 lb

## 2018-12-12 DIAGNOSIS — C61 Malignant neoplasm of prostate: Secondary | ICD-10-CM

## 2018-12-12 DIAGNOSIS — C7951 Secondary malignant neoplasm of bone: Principal | ICD-10-CM

## 2018-12-12 DIAGNOSIS — Z8042 Family history of malignant neoplasm of prostate: Secondary | ICD-10-CM | POA: Diagnosis not present

## 2018-12-12 DIAGNOSIS — Z192 Hormone resistant malignancy status: Secondary | ICD-10-CM | POA: Diagnosis not present

## 2018-12-12 DIAGNOSIS — G893 Neoplasm related pain (acute) (chronic): Secondary | ICD-10-CM | POA: Diagnosis not present

## 2018-12-12 DIAGNOSIS — Z510 Encounter for antineoplastic radiation therapy: Secondary | ICD-10-CM | POA: Diagnosis not present

## 2018-12-12 NOTE — Progress Notes (Signed)
Radiation Oncology         [phone] ________________________________  Outpatient Follow-Up  Name: Gregory Schneider MRN: [id number]  Date of Service: 12/12/2018 DOB: January 11, 1956  GX:QJJHERD, Beverely Low, MD  Derinda Late, MD   REFERRING PHYSICIAN: Derinda Late, MD  DIAGNOSIS: 63 y.o. 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 Primary malignant neoplasm of prostate metastatic to bone [facility]) C61    C79.51     HISTORY OF PRESENT ILLNESS: Gregory Schneider is a 63 y.o. male seen at the request of Dr. Alen Blew.  He was initially diagnosed with advanced Gleason 4+5 adenocarcinoma of the prostate with a PSA of 15 in 2010.  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  He has previously been treated with Provenge and Zytiga both of which were discontinued due to disease progression.  He continues on Roland, started in February 2018 under the care of Dr. Alen Blew.  Most recently, he completed Xofigo 6 of 6 infusions in November 2019.  His PSA has shown a slow rise over the past several months, from 4.6 in August to 13.9 this month.  He has continued with chronic aching pelvic pain/rectal pressure and fullness for the past 6 months.  Recent CT and bone scan for disease restaging performed on November 29, 2018 confirmed no metastatic disease progression but possibly increasing local disease with enlargement of the prostate which is now indenting on the bladder, as well as progressive enlargement of a lesion at the pubic symphysis.  He has not had any prior pelvic/prostate radiotherapy.    He has kindly been referred today for discussion of potential palliative radiotherapy to the prostate in hopes that this might provide relief of the chronic pelvic pain as well as durable disease  control.  PREVIOUS RADIATION THERAPY: Yes: Completed 6/6 Xofigo infusions in 09/2018  PAST MEDICAL HISTORY:  Past Medical History:  Diagnosis Date  . Arthritis   . BPH (benign prostatic hyperplasia)   . Cancer ([place])   . Depression   . Hypertension   . Pneumonia    hx of  . Prostate cancer ([place]) 03/03/2012      PAST SURGICAL HISTORY: Past Surgical History:  Procedure Laterality Date  . HYDROCELE EXCISION Left 12/25/2012   Procedure: HYDROCELECTOMY ADULT;  Surgeon: Dutch Gray, MD;  Location: [facility];  Service: Urology;  Laterality: Left;  LEFT HYDROCELE REPAIR, BILATERAL SIMPLE ORCHIECTOMY   . ORCHIECTOMY Bilateral 12/25/2012   Procedure: ORCHIECTOMY;  Surgeon: Dutch Gray, MD;  Location: [facility];  Service: Urology;  Laterality: Bilateral;    FAMILY HISTORY:  Family History  Problem Relation Age of Onset  . Prostate cancer Paternal Grandfather 24  . Stomach cancer Paternal Grandfather   . Cancer Paternal Aunt 33       breast    SOCIAL HISTORY:  Social History   Socioeconomic History  . Marital status: Married    Spouse name: Not on file  . Number of children: Not on file  . Years of education: Not on file  . Highest education level: Not on file  Occupational History  . Not on file  Social Needs  . Financial resource strain: Not on file  . Food insecurity:    Worry: Not on file    Inability: Not on file  . Transportation needs:  Medical: Not on file    Non-medical: Not on file  Tobacco Use  . Smoking status: Former Smoker    Packs/day: 1.50    Years: 20.00    Pack years: 30.00    Types: Cigarettes    Last attempt to quit: 03/03/1992    Years since quitting: 26.7  . Smokeless tobacco: Current User    Types: Snuff  Substance and Sexual Activity  . Alcohol use: Yes    Comment: 2 a day  . Drug use: Yes    Types: Marijuana    Comment: last night  . Sexual activity: Not Currently  Lifestyle  . Physical activity:    Days per week: Not on file    Minutes per  session: Not on file  . Stress: Not on file  Relationships  . Social connections:    Talks on phone: Not on file    Gets together: Not on file    Attends religious service: Not on file    Active member of club or organization: Not on file    Attends meetings of clubs or organizations: Not on file    Relationship status: Not on file  . Intimate partner violence:    Fear of current or ex partner: Not on file    Emotionally abused: Not on file    Physically abused: Not on file    Forced sexual activity: Not on file  Other Topics Concern  . Not on file  Social History Narrative   Resides in [place].    ALLERGIES: Penicillins  MEDICATIONS:  Current Outpatient Medications  Medication Sig Dispense Refill  . amLODipine (NORVASC) 10 MG tablet Take by mouth.    Marland Kitchen buPROPion (ZYBAN) 150 MG 12 hr tablet Take 150 mg by mouth.    . calcium carbonate (OS-CAL) 600 MG TABS Take 600 mg by mouth daily.    . cholecalciferol (VITAMIN D) 1000 UNITS tablet Take 1,000 Units by mouth daily.    . clonazePAM (KLONOPIN) 1 MG tablet Take 1 mg by mouth as needed for anxiety (1-2 tablets daily as needed).     . Cyanocobalamin (RA VITAMIN B-12 TR) 1000 MCG TBCR Take by mouth.    . enzalutamide (XTANDI) 40 MG capsule Take 4 capsules (160 mg total) by mouth daily. 120 capsule 0  . gabapentin (NEURONTIN) 300 MG capsule Take 1 capsule (300 mg total) by mouth at bedtime. 90 capsule 3  . HYDROcodone-acetaminophen (NORCO/VICODIN) 5-325 MG tablet Take 1-2 tablets by mouth every 6 (six) hours as needed. 90 tablet 0  . losartan-hydrochlorothiazide (HYZAAR) 100-25 MG tablet   0  . Multiple Vitamin (MULTIVITAMIN) tablet Take 1 tablet by mouth daily.    . sertraline (ZOLOFT) 100 MG tablet Take 200 mg by mouth every morning.     . prochlorperazine (COMPAZINE) 10 MG tablet Take 1 tablet (10 mg total) by mouth every 6 (six) hours as needed for nausea or vomiting. (Patient not taking: Reported on 12/12/2018) 30 tablet 3    No current facility-administered medications for this encounter.     REVIEW OF SYSTEMS:  On review of systems, the patient reports that he is doing well overall. He denies any chest pain, shortness of breath, cough, fevers, chills, night sweats, or unintended weight changes. He denies any bowel disturbances, and denies abdominal pain, nausea or vomiting. He denies any new musculoskeletal or joint aches or pains. He reports chronic achy fullness and pressure in his pelvic area that started 10 years ago at initial  diagnosis. He states that it felt like he was "sitting on a softball" but resolved when he started ADT. He states that in the past 6 months, this pelvic discomfort has returned but is not as severe/bothersome as it was years ago but still feels like he is sitting on a golf ball. He reports it is not better or worse with leaning forward or backward. His IPSS score is 26, indicating severe urinary symptoms with hesitancy, intermittency, weak stream, urgency, frequency and occasional UUI. He states that smoking marijuana helps him relax and urinate better. A complete review of systems is obtained and is otherwise negative.  PHYSICAL EXAM:  Wt Readings from Last 3 Encounters:  12/12/18 (!) 353 lb 2 oz (160.2 kg)  12/01/18 (!) 358 lb 11.2 oz (162.7 kg)  10/19/18 (!) 361 lb 4.8 oz (163.9 kg)   Temp Readings from Last 3 Encounters:  12/12/18 98.2 F (36.8 C) (Oral)  12/01/18 97.9 F (36.6 C) (Oral)  10/19/18 97.6 F (36.4 C) (Oral)   BP Readings from Last 3 Encounters:  12/12/18 (!) 152/91  12/01/18 (!) 151/95  10/19/18 137/90   Pulse Readings from Last 3 Encounters:  12/12/18 87  12/01/18 83  10/19/18 74   Pain Assessment Pain Score: 0-No pain/10  In general this is a well appearing caucasian male in no acute distress. He is alert and oriented x4 and appropriate throughout the examination. HEENT reveals that the patient is normocephalic, atraumatic. Cardiovascular exam reveals a  regular rate and rhythm, no clicks rubs or murmurs are auscultated. Chest is clear to auscultation bilaterally. Lymphatic assessment is performed and does not reveal any adenopathy in the cervical, supraclavicular, axillary, or inguinal chains. Abdomen has active bowel sounds in all quadrants and is intact. The abdomen is soft, non tender, non distended. Lower extremities are negative for pretibial pitting edema, deep calf tenderness, cyanosis or clubbing.  KPS = 90  100 - Normal; no complaints; no evidence of disease. 90   - Able to carry on normal activity; minor signs or symptoms of disease. 80   - Normal activity with effort; some signs or symptoms of disease. 59   - Cares for self; unable to carry on normal activity or to do active work. 60   - Requires occasional assistance, but is able to care for most of his personal needs. 50   - Requires considerable assistance and frequent medical care. 39   - Disabled; requires special care and assistance. 45   - Severely disabled; hospital admission is indicated although death not imminent. 20   - Very sick; hospital admission necessary; active supportive treatment necessary. 10   - Moribund; fatal processes progressing rapidly. 0     - Dead  Karnofsky DA, Abelmann Gates, Craver LS and Burchenal [facility] [phone]) The use of the nitrogen mustards in the palliative treatment of carcinoma: with particular reference to bronchogenic carcinoma Cancer 1 634-56  LABORATORY DATA:  Lab Results  Component Value Date   WBC 5.3 11/29/2018   HGB 13.5 11/29/2018   HCT 38.5 (L) 11/29/2018   MCV 90.2 11/29/2018   PLT 134 (L) 11/29/2018   Lab Results  Component Value Date   NA 137 11/29/2018   K 3.7 11/29/2018   CL 97 (L) 11/29/2018   CO2 27 11/29/2018   Lab Results  Component Value Date   ALT 46 (H) 11/29/2018   AST 25 11/29/2018   ALKPHOS 81 11/29/2018   BILITOT 0.7 11/29/2018     RADIOGRAPHY: Nm  Bone Scan Whole Body  Result Date: 11/29/2018 CLINICAL  DATA:  Prostate cancer EXAM: NUCLEAR MEDICINE WHOLE BODY BONE SCAN TECHNIQUE: Whole body anterior and posterior images were obtained approximately 3 hours after intravenous injection of radiopharmaceutical. RADIOPHARMACEUTICALS:  22 mCi Technetium-9m MDP IV COMPARISON:  02/14/2018 Radiographic correlation: CT abdomen and pelvis 11/29/2018 FINDINGS: Abnormal foci of increased tracer accumulation are seen within the pelvis, proximal RIGHT femur, and anterior LEFT ribs consistent with osseous metastases. Probable metastatic focus at RIGHT T11 vertebral body. Additional foci of uptake seen previously within the spine, pelvis, and LEFT ribs are either less conspicuous or no longer visualized. New focus of increased tracer localization at the posterior RIGHT ninth rib corresponding to a fracture on CT. Likely degenerative type uptake at the shoulders, sternoclavicular joints, and hips. Question posttraumatic or degenerative type uptake at the RIGHT ankle, new. No new sites of worrisome osseous tracer accumulation are seen. Expected urinary tract and soft tissue distribution of tracer. IMPRESSION: Osseous metastatic disease, with fever sites identified on the current exam. No definite new sites of uptake are identified to suggest osseous metastases. New uptake at posterior RIGHT ninth rib corresponding to fracture on CT. New uptake at the RIGHT ankle, favor traumatic less likely degenerative in etiology; recommend correlation with history and potentially radiographs if indicated. Electronically Signed   By: Lavonia Dana M.D.   On: 11/29/2018 20:53   Ct Abdomen Pelvis W Contrast  Result Date: 11/29/2018 CLINICAL DATA:  Prostate cancer. EXAM: CT ABDOMEN AND PELVIS WITH CONTRAST TECHNIQUE: Multidetector CT imaging of the abdomen and pelvis was performed using the standard protocol following bolus administration of intravenous contrast. CONTRAST:  172mL OMNIPAQUE IOHEXOL 300 MG/ML  SOLN COMPARISON:  02/14/2018 and  11/17/2016. FINDINGS: Lower chest: 5 mm right (series 6, image 29) lower lobe nodule, unchanged from 11/17/2016 and considered benign. Heart size normal. No pericardial or pleural effusion. Distal esophagus is grossly unremarkable. Hepatobiliary: Liver is decreased in attenuation diffusely with probable areas of peripheral sparing. Gallbladder is unremarkable. No biliary ductal dilatation. Pancreas: Negative. Spleen: Negative. Adrenals/Urinary Tract: Adrenal glands and kidneys are unremarkable. Ureters are decompressed. Bladder may be slightly thick-walled. Stomach/Bowel: Stomach, small bowel, appendix and colon are unremarkable. Vascular/Lymphatic: Atherosclerotic calcification of the aorta without aneurysm. No pathologically enlarged lymph nodes. Reproductive: Prostate is visualized and indents the bladder. Other: Small bilateral inguinal hernias contain fat. No free fluid. Mesenteries and peritoneum are unremarkable. Musculoskeletal: Sclerotic lesions are seen throughout the osseous structures. A fracture of the right posterolateral ninth rib is new from 02/14/2018. Associated callus formation is indicative of a remote injury. IMPRESSION: 1. Osseous metastatic disease is grossly stable. 2. Mildly enlarged prostate which indents the bladder. Slight bladder wall thickening may be due to outlet obstruction. 3. Hepatic steatosis. 4.  Aortic atherosclerosis (ICD10-170.0). Electronically Signed   By: Lorin Picket M.D.   On: 11/29/2018 15:12      IMPRESSION/PLAN: 1. 63 y.o. 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   Today, we talked to the patient about the findings and workup thus far. We discussed the natural history of metastatic castrate resistant prostate carcinoma and general treatment, highlighting the role of palliative prostate radiotherapy in the management. We reviewed the patient's recent CT and bone scans from November 29, 2018 that  confirmed no metastatic disease progression but possibly increasing local disease with enlargement of the prostate which is now indenting on the bladder, as well as progressive enlargement of a lesion at  the pubic symphysis. We discussed the available radiation techniques, and focused on the details of logistics and delivery. The recommendation is to proceed with a 10 day course of palliative radiotherapy to the prostate and pubic symphysis.  We reviewed the anticipated acute and late sequelae associated with radiation in this setting. The patient was encouraged to ask questions that were answered to his satisfaction.   At the end of the conversation, the patient elects to proceed with palliative radiotherapy to the prostate and pubic symphysis. He freely signed written consent today in the office and a copy of this document placed in the patient chart, as well as a copy provided to the patient. He will undergo CT simulation today in anticipation of beginning his treatments in the very near future.  We enjoyed meeting with him today and look forward to  Further participating in his care.    Nicholos Johns, PA-C    Tyler Pita, MD  [facility] Direct Dial: [phone]  Fax: [phone] [email or web address]  Skype  [email or web address]  This document serves as a record of services personally performed by Tyler Pita, MD and [name], PA-C. It was created on their behalf by Rae Lips, a trained medical scribe. The creation of this record is based on the scribe's personal observations and the providers' statements to them. This document has been checked and approved by the attending providers.

## 2018-12-13 NOTE — Progress Notes (Signed)
  Radiation Oncology         [phone] ________________________________  Name: Gregory Schneider MRN: [id number]  Date: 12/12/2018  DOB: 07-11-1956  SIMULATION AND TREATMENT PLANNING NOTE    ICD-10-CM   1. Prostate cancer [facility]) C61     DIAGNOSIS:  63 y.o. 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  Identity was confirmed.  All relevant records and images related to the planned course of therapy were reviewed.  The patient freely provided informed written consent to proceed with treatment after reviewing the details related to the planned course of therapy. The consent form was witnessed and verified by the simulation staff.  Then, the patient was set-up in a stable reproducible supine position for radiation therapy.  A vacuum lock pillow device was custom fabricated to position his legs in a reproducible immobilized position.  Then, I performed a urethrogram under sterile conditions to identify the prostatic apex.  CT images were obtained.  Surface markings were placed.  The CT images were loaded into the planning software.  Then the prostate target and avoidance structures including the rectum, bladder, bowel and hips were contoured.  Treatment planning then occurred.  The radiation prescription was entered and confirmed.  A total of one complex treatment devices was fabricated. I have requested : 3D plan is medically necessary for this case for the following reason:  Rectal sparing.  I have requested DVH's of the structures above and daily cone beam IGRT to assess positioning.  PLAN:  The prostate and pubic rami metastases will receive 30 Gy in 10 fractions.  ________________________________  Sheral Apley Tammi Klippel, M.D.

## 2018-12-14 ENCOUNTER — Other Ambulatory Visit: Payer: Self-pay | Admitting: Oncology

## 2018-12-14 DIAGNOSIS — C61 Malignant neoplasm of prostate: Secondary | ICD-10-CM

## 2018-12-15 MED FILL — XTANDI 40 MG CAPSULE: 40 | 30 days supply | Qty: 120 | Fill #0

## 2018-12-16 DIAGNOSIS — Z510 Encounter for antineoplastic radiation therapy: Secondary | ICD-10-CM | POA: Insufficient documentation

## 2018-12-16 DIAGNOSIS — C61 Malignant neoplasm of prostate: Secondary | ICD-10-CM | POA: Diagnosis not present

## 2018-12-19 ENCOUNTER — Ambulatory Visit
Admission: RE | Admit: 2018-12-19 | Discharge: 2018-12-19 | Disposition: A | Discharge status: Home / Self Care | Payer: Medicare HMO | Source: Ambulatory Visit | Attending: Radiation Oncology | Admitting: Radiation Oncology

## 2018-12-19 DIAGNOSIS — Z510 Encounter for antineoplastic radiation therapy: Secondary | ICD-10-CM | POA: Diagnosis not present

## 2018-12-19 DIAGNOSIS — C61 Malignant neoplasm of prostate: Secondary | ICD-10-CM | POA: Diagnosis not present

## 2018-12-20 ENCOUNTER — Ambulatory Visit
Admission: RE | Admit: 2018-12-20 | Discharge: 2018-12-20 | Disposition: A | Discharge status: Home / Self Care | Payer: Medicare HMO | Source: Ambulatory Visit | Attending: Radiation Oncology | Admitting: Radiation Oncology

## 2018-12-20 DIAGNOSIS — C61 Malignant neoplasm of prostate: Secondary | ICD-10-CM | POA: Diagnosis not present

## 2018-12-20 DIAGNOSIS — Z510 Encounter for antineoplastic radiation therapy: Secondary | ICD-10-CM | POA: Diagnosis not present

## 2018-12-21 ENCOUNTER — Ambulatory Visit
Admission: RE | Admit: 2018-12-21 | Discharge: 2018-12-21 | Disposition: A | Discharge status: Home / Self Care | Payer: Medicare HMO | Source: Ambulatory Visit | Attending: Radiation Oncology | Admitting: Radiation Oncology

## 2018-12-21 DIAGNOSIS — C61 Malignant neoplasm of prostate: Secondary | ICD-10-CM | POA: Diagnosis not present

## 2018-12-21 DIAGNOSIS — Z510 Encounter for antineoplastic radiation therapy: Secondary | ICD-10-CM | POA: Diagnosis not present

## 2018-12-22 ENCOUNTER — Ambulatory Visit
Admission: RE | Admit: 2018-12-22 | Discharge: 2018-12-22 | Disposition: A | Discharge status: Home / Self Care | Payer: Medicare HMO | Source: Ambulatory Visit | Attending: Radiation Oncology | Admitting: Radiation Oncology

## 2018-12-22 DIAGNOSIS — Z510 Encounter for antineoplastic radiation therapy: Secondary | ICD-10-CM | POA: Diagnosis not present

## 2018-12-22 DIAGNOSIS — C61 Malignant neoplasm of prostate: Secondary | ICD-10-CM | POA: Diagnosis not present

## 2018-12-25 ENCOUNTER — Ambulatory Visit
Admission: RE | Admit: 2018-12-25 | Discharge: 2018-12-25 | Disposition: A | Discharge status: Home / Self Care | Payer: Medicare HMO | Source: Ambulatory Visit | Attending: Radiation Oncology | Admitting: Radiation Oncology

## 2018-12-25 DIAGNOSIS — Z510 Encounter for antineoplastic radiation therapy: Secondary | ICD-10-CM | POA: Diagnosis not present

## 2018-12-25 DIAGNOSIS — C61 Malignant neoplasm of prostate: Secondary | ICD-10-CM | POA: Diagnosis not present

## 2018-12-26 ENCOUNTER — Ambulatory Visit
Admission: RE | Admit: 2018-12-26 | Discharge: 2018-12-26 | Disposition: A | Discharge status: Home / Self Care | Payer: Medicare HMO | Source: Ambulatory Visit | Attending: Radiation Oncology | Admitting: Radiation Oncology

## 2018-12-26 DIAGNOSIS — Z510 Encounter for antineoplastic radiation therapy: Secondary | ICD-10-CM | POA: Diagnosis not present

## 2018-12-26 DIAGNOSIS — C61 Malignant neoplasm of prostate: Secondary | ICD-10-CM | POA: Diagnosis not present

## 2018-12-27 ENCOUNTER — Ambulatory Visit
Admission: RE | Admit: 2018-12-27 | Discharge: 2018-12-27 | Disposition: A | Discharge status: Home / Self Care | Payer: Medicare HMO | Source: Ambulatory Visit | Attending: Radiation Oncology | Admitting: Radiation Oncology

## 2018-12-27 DIAGNOSIS — Z510 Encounter for antineoplastic radiation therapy: Secondary | ICD-10-CM | POA: Diagnosis not present

## 2018-12-27 DIAGNOSIS — C61 Malignant neoplasm of prostate: Secondary | ICD-10-CM | POA: Diagnosis not present

## 2018-12-28 ENCOUNTER — Ambulatory Visit
Admission: RE | Admit: 2018-12-28 | Discharge: 2018-12-28 | Disposition: A | Discharge status: Home / Self Care | Payer: Medicare HMO | Source: Ambulatory Visit | Attending: Radiation Oncology | Admitting: Radiation Oncology

## 2018-12-28 DIAGNOSIS — C61 Malignant neoplasm of prostate: Secondary | ICD-10-CM | POA: Diagnosis not present

## 2018-12-28 DIAGNOSIS — Z510 Encounter for antineoplastic radiation therapy: Secondary | ICD-10-CM | POA: Diagnosis not present

## 2018-12-29 ENCOUNTER — Ambulatory Visit
Admission: RE | Admit: 2018-12-29 | Discharge: 2018-12-29 | Disposition: A | Discharge status: Home / Self Care | Payer: Medicare HMO | Source: Ambulatory Visit | Attending: Radiation Oncology | Admitting: Radiation Oncology

## 2018-12-29 DIAGNOSIS — Z510 Encounter for antineoplastic radiation therapy: Secondary | ICD-10-CM | POA: Diagnosis not present

## 2018-12-29 DIAGNOSIS — C61 Malignant neoplasm of prostate: Secondary | ICD-10-CM | POA: Diagnosis not present

## 2019-01-01 ENCOUNTER — Ambulatory Visit
Admission: RE | Admit: 2019-01-01 | Discharge: 2019-01-01 | Disposition: A | Discharge status: Home / Self Care | Payer: Medicare HMO | Source: Ambulatory Visit | Attending: Radiation Oncology | Admitting: Radiation Oncology

## 2019-01-01 DIAGNOSIS — C61 Malignant neoplasm of prostate: Secondary | ICD-10-CM | POA: Diagnosis not present

## 2019-01-01 DIAGNOSIS — Z510 Encounter for antineoplastic radiation therapy: Secondary | ICD-10-CM | POA: Diagnosis not present

## 2019-01-05 ENCOUNTER — Encounter: Payer: Self-pay | Admitting: Radiation Oncology

## 2019-01-05 NOTE — Progress Notes (Signed)
  Radiation Oncology         [phone] ________________________________  Name: Gregory Schneider MRN: [id number]  Date: 01/05/2019  DOB: [birth year]  End of Treatment Note  Diagnosis:   63 y.o. 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 He reported issues emptying his bladder, weak urine stream, urinary urgency, some leakage, mild fatigue, and nocturia x6 during treatment. He denied dysuria or hematuria and issues with his bowels throughout treatment.  Plan: The patient has completed radiation treatment. He will return to radiation oncology clinic for routine followup in one month. I advised him to call or return sooner if he has any questions or concerns related to his recovery or treatment. ________________________________  Sheral Apley. Tammi Klippel, M.D.  This document serves as a record of services personally performed by Tyler Pita, MD. It was created on his behalf by Wilburn Mylar, a trained medical scribe. The creation of this record is based on the scribe's personal observations and the provider's statements to them. This document has been checked and approved by the attending provider.

## 2019-01-10 ENCOUNTER — Other Ambulatory Visit: Payer: Self-pay | Admitting: Oncology

## 2019-01-10 DIAGNOSIS — C61 Malignant neoplasm of prostate: Secondary | ICD-10-CM

## 2019-01-18 ENCOUNTER — Inpatient Hospital Stay: Payer: Medicare HMO

## 2019-01-18 ENCOUNTER — Inpatient Hospital Stay: Payer: Medicare HMO | Attending: Oncology | Admitting: Oncology

## 2019-01-18 ENCOUNTER — Telehealth: Payer: Self-pay | Admitting: Oncology

## 2019-01-18 VITALS — BP 148/91 | HR 82 | Temp 98.2°F | Resp 18 | Ht 75.0 in | Wt 355.5 lb

## 2019-01-18 DIAGNOSIS — C61 Malignant neoplasm of prostate: Secondary | ICD-10-CM | POA: Diagnosis present

## 2019-01-18 DIAGNOSIS — Z79899 Other long term (current) drug therapy: Secondary | ICD-10-CM | POA: Insufficient documentation

## 2019-01-18 DIAGNOSIS — I10 Essential (primary) hypertension: Secondary | ICD-10-CM | POA: Diagnosis not present

## 2019-01-18 DIAGNOSIS — C7951 Secondary malignant neoplasm of bone: Secondary | ICD-10-CM | POA: Diagnosis not present

## 2019-01-18 DIAGNOSIS — Z923 Personal history of irradiation: Secondary | ICD-10-CM | POA: Diagnosis not present

## 2019-01-18 LAB — COMPREHENSIVE METABOLIC PANEL
ALT: 46 U/L — ABNORMAL HIGH (ref 0–44)
AST: 27 U/L (ref 15–41)
Albumin: 4.2 g/dL (ref 3.5–5.0)
Alkaline Phosphatase: 74 U/L (ref 38–126)
Anion gap: 9 (ref 5–15)
BUN: 20 mg/dL (ref 6–20)
CO2: 30 mmol/L (ref 22–32)
Calcium: 9.6 mg/dL (ref 8.9–10.3)
Chloride: 98 mmol/L (ref 98–111)
Creatinine, Ser: 0.96 mg/dL (ref 0.61–1.24)
GFR calc Af Amer: >60 mL/min (ref >60)
GFR calc non Af Amer: >60 mL/min (ref >60)
Glucose, Bld: 152 mg/dL — ABNORMAL HIGH (ref 70–99)
Potassium: 3.4 mmol/L — ABNORMAL LOW (ref 3.5–5.1)
Sodium: 137 mmol/L (ref 135–145)
Total Bilirubin: 0.7 mg/dL (ref 0.3–1.2)
Total Protein: 7.6 g/dL (ref 6.5–8.1)

## 2019-01-18 LAB — CBC WITH DIFFERENTIAL (CANCER CENTER ONLY)
Abs Immature Granulocytes: 0.03 10*3/uL (ref 0.00–0.07)
Basophils Absolute: 0 10*3/uL (ref 0.0–0.1)
Basophils Relative: 0 %
Eosinophils Absolute: 0.1 10*3/uL (ref 0.0–0.5)
Eosinophils Relative: 3 %
HCT: 37.3 % — ABNORMAL LOW (ref 39.0–52.0)
Hemoglobin: 13.1 g/dL (ref 13.0–17.0)
Immature Granulocytes: 1 %
Lymphocytes Relative: 11 %
Lymphs Abs: 0.5 10*3/uL — ABNORMAL LOW (ref 0.7–4.0)
MCH: 32.6 pg (ref 26.0–34.0)
MCHC: 35.1 g/dL (ref 30.0–36.0)
MCV: 92.8 fL (ref 80.0–100.0)
Monocytes Absolute: 0.4 10*3/uL (ref 0.1–1.0)
Monocytes Relative: 8 %
Neutro Abs: 3.4 10*3/uL (ref 1.7–7.7)
Neutrophils Relative %: 77 %
Platelet Count: 116 10*3/uL — ABNORMAL LOW (ref 150–400)
RBC: 4.02 MIL/uL — ABNORMAL LOW (ref 4.22–5.81)
RDW: 14.9 % (ref 11.5–15.5)
WBC Count: 4.4 10*3/uL (ref 4.0–10.5)
nRBC: 0 % (ref 0.0–0.2)

## 2019-01-18 MED ORDER — HYDROCODONE-ACETAMINOPHEN 5-325 MG PO TABS: 1.0000 | ORAL_TABLET | Freq: Four times a day (QID) | ORAL | 0 refills | Status: DC | PRN

## 2019-01-18 MED ORDER — GABAPENTIN 300 MG PO CAPS: 300.0000 mg | ORAL_CAPSULE | Freq: Two times a day (BID) | ORAL | 3 refills | Status: DC

## 2019-01-18 MED FILL — XTANDI 40 MG CAPSULE: 40 | 30 days supply | Qty: 120 | Fill #0

## 2019-01-18 NOTE — Progress Notes (Signed)
Hematology and Oncology Follow Up Visit  Gregory Schneider [id number] 06-Feb-1956 63 y.o. 01/18/2019 9:30 AM   Principle Diagnosis: 63 year old man with castration-resistant prostate cancer with disease to the bone diagnosed in 2013.  He presented with PSA of 15 and Gleason score 4 + 5 = 9 in 2010 at the time of diagnosis.    Prior Therapy:  Combined androgen deprivation with Lupron and Casodex. He had a good response and then developed a rise in the PSA with castrate level testosterone.  He is S/P Bilateral simple orchiectomy done on 12/25/2012.  Provenge started on 03/03/12. Therapy Ended in 03/31/2012.  Zytiga started in 10/2013. Therapy discontinued in January 2018 because of progression of disease. Xofigo infusion to start on Apr 13, 2018.  He completed 6 months of therapy in November 2019. Status post prostate radiation between December 19, 2018 and January 01, 2019.  He received total of 30 Gy in 10 fractions to the prostate and the pubic rami.  Current therapy:   Xtandi 160 mg daily started in February 2018.    Interim History:  Gregory Schneider is here for a repeat evaluation.  Since the last visit, he completed radiation therapy with a few complaints.  He reported some fatigue and tiredness associated with this treatment as well as urinary frequency and dysuria.  No hematuria or fevers noted.  He has resumed a lot of activities of daily living for the most part.  He is pelvic discomfort has improved slightly after radiation.  He denies any Xtandi complications including worsening edema or high blood pressure elevation.  Denies any recent hospitalization or illnesses.  His arthralgias and myalgias continues to improve after completing Xofigo although uses hydrocodone periodically.  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  Patient denied any alteration mental status, neuropathy, confusion or dizziness.  Denies any headaches or lethargy.  Denies any night sweats,  weight loss or changes in appetite.  Denied orthopnea, dyspnea on exertion or chest discomfort.  Denies shortness of breath, difficulty breathing hemoptysis or cough.  Denies any abdominal distention, nausea, early satiety or dyspepsia.  Denies any hematuria, frequency, dysuria or nocturia.  Denies any skin irritation, dryness or rash.  Denies any ecchymosis or petechiae.  Denies any lymphadenopathy or clotting.  Denies any heat or cold intolerance.  Denies any anxiety or depression.  Remaining review of system is negative.     Medications: Reviewed today and no changes. Current Outpatient Medications  Medication Sig Dispense Refill  . amLODipine (NORVASC) 10 MG tablet Take by mouth.    Marland Kitchen buPROPion (ZYBAN) 150 MG 12 hr tablet Take 150 mg by mouth.    . calcium carbonate (OS-CAL) 600 MG TABS Take 600 mg by mouth daily.    . cholecalciferol (VITAMIN D) 1000 UNITS tablet Take 1,000 Units by mouth daily.    . clonazePAM (KLONOPIN) 1 MG tablet Take 1 mg by mouth as needed for anxiety (1-2 tablets daily as needed).     . Cyanocobalamin (RA VITAMIN B-12 TR) 1000 MCG TBCR Take by mouth.    . gabapentin (NEURONTIN) 300 MG capsule Take 1 capsule (300 mg total) by mouth at bedtime. 90 capsule 3  . HYDROcodone-acetaminophen (NORCO/VICODIN) 5-325 MG tablet Take 1-2 tablets by mouth every 6 (six) hours as needed. 90 tablet 0  . losartan-hydrochlorothiazide (HYZAAR) 100-25 MG tablet   0  . Multiple Vitamin (MULTIVITAMIN) tablet Take 1 tablet by mouth daily.    . prochlorperazine (COMPAZINE) 10 MG  tablet Take 1 tablet (10 mg total) by mouth every 6 (six) hours as needed for nausea or vomiting. (Patient not taking: Reported on 12/12/2018) 30 tablet 3  . sertraline (ZOLOFT) 100 MG tablet Take 200 mg by mouth every morning.     Gillermina Phy 40 MG capsule TAKE 4 CAPSULES (160 MG TOTAL) BY MOUTH DAILY. 120 capsule 0   No current facility-administered medications for this visit.     Allergies:  Allergies  Allergen  Reactions  . Penicillins Other (See Comments)    Patient stated,"I don't remember what kind of reaction because I was a kid."    His past medical history, social history updated without changes today.  Physical Exam:  Blood pressure (!) 148/91, pulse 82, temperature 98.2 F (36.8 C), temperature source Oral, resp. rate 18, height 6\' 3"  (1.905 m), weight (!) 355 lb 8 oz (161.3 kg), SpO2 98 %.     ECOG: 1   General appearance: Alert, awake without any distress. Head: Atraumatic without abnormalities Oropharynx: Without any thrush or ulcers. Eyes: No scleral icterus. Lymph nodes: No lymphadenopathy noted in the cervical, supraclavicular, or axillary nodes Heart:regular rate and rhythm, without any murmurs or gallops.   Lung: Clear to auscultation without any rhonchi, wheezes or dullness to percussion. Abdomin: Soft, nontender without any shifting dullness or ascites. Musculoskeletal: No clubbing or cyanosis. Neurological: No motor or sensory deficits. Skin: No rashes or lesions.         Lab Results: Lab Results  Component Value Date   WBC 5.3 11/29/2018   HGB 13.5 11/29/2018   HCT 38.5 (L) 11/29/2018   MCV 90.2 11/29/2018   PLT 134 (L) 11/29/2018     Chemistry      Component Value Date/Time   NA 137 11/29/2018 1109   NA 136 11/17/2017 0956   K 3.7 11/29/2018 1109   K 3.8 11/17/2017 0956   CL 97 (L) 11/29/2018 1109   CL 104 03/09/2013 0921   CO2 27 11/29/2018 1109   CO2 28 11/17/2017 0956   BUN 14 11/29/2018 1109   BUN 19.7 11/17/2017 0956   CREATININE 0.85 11/29/2018 1109   CREATININE 0.9 11/17/2017 0956      Component Value Date/Time   CALCIUM 9.8 11/29/2018 1109   CALCIUM 9.5 11/17/2017 0956   ALKPHOS 81 11/29/2018 1109   ALKPHOS 89 11/17/2017 0956   AST 25 11/29/2018 1109   AST 21 11/17/2017 0956   ALT 46 (H) 11/29/2018 1109   ALT 47 11/17/2017 0956   BILITOT 0.7 11/29/2018 1109   BILITOT 0.39 11/17/2017 0956       IMPRESSION: 1. Osseous  metastatic disease is grossly stable. 2. Mildly enlarged prostate which indents the bladder. Slight bladder wall thickening may be due to outlet obstruction. 3. Hepatic steatosis. 4.  Aortic atherosclerosis (ICD10-170.0).   EXAM: NUCLEAR MEDICINE WHOLE BODY BONE SCAN  TECHNIQUE: Whole body anterior and posterior images were obtained approximately 3 hours after intravenous injection of radiopharmaceutical.  RADIOPHARMACEUTICALS:  22 mCi Technetium-22m MDP IV  COMPARISON:  02/14/2018  Radiographic correlation: CT abdomen and pelvis 11/29/2018   IMPRESSION: Osseous metastatic disease, with fever sites identified on the current exam.  No definite new sites of uptake are identified to suggest osseous metastases.  New uptake at posterior RIGHT ninth rib corresponding to fracture on CT.  New uptake at the RIGHT ankle, favor traumatic less likely degenerative in etiology; recommend correlation with history and potentially radiographs if indicated.     Impression and Plan:  63 year old man with  1.  Castration-resistant prostate cancer with disease to the bone documented since 2013.  He is currently on Xtandi which she has tolerated reasonably well.  He has developed a local recurrence within the prostate and completed radiation therapy under the care of Dr. Tammi Klippel in February 2020.  Treatment options moving forward were reviewed today which include continuing Xtandi versus switching to different salvage therapy.  Given that his disease have progressed locally and this issue has been addressed I favor continuing Xtandi for the time being unless his PSA continues to rise.  Systemic chemotherapy will be his next option.  He is agreeable with this plan.   2. Androgen deprivation: His testosterone level remains at a castrate level after bilateral orchiectomy.  3.  Bone directed therapy: He has been on Zometa in the past that was discontinued because of dental  complaints.  He remains on calcium and vitamin D supplements.  4. Hypertension: Blood pressure appears under control at this time.  5.  Prognosis and goals of care: His disease is incurable but his performance status is excellent and aggressive therapy remains warranted.  6.  Bone pain: He has been using hydrocodone which is effective for treating his bone pain.  I have increased his Neurontin as well to 300 mg twice a day and gave him the option to escalate to 3 times a day in the next 1 to 2 weeks.  7. Follow up: We will be in   25 minutes was spent with the patient face-to-face today.  More than 50% of time was dedicated to discussing the natural course of his disease, treatment options and reviewing imaging studies.  We also discussed future plan of care.    Zola Button MD 3/5/20209:30 AM

## 2019-01-18 NOTE — Telephone Encounter (Signed)
Scheduled appt per 3/5 los. ° °Printed calendar and avs. °

## 2019-01-19 ENCOUNTER — Telehealth: Payer: Self-pay

## 2019-01-19 LAB — PROSTATE-SPECIFIC AG, SERUM (LABCORP): Prostate Specific Ag, Serum: 10.1 ng/mL — ABNORMAL HIGH (ref 0.0–4.0)

## 2019-01-19 NOTE — Telephone Encounter (Signed)
-----   Message from Wyatt Portela, MD sent at 01/19/2019  8:21 AM EST ----- Please let him know his PSA is down.

## 2019-01-19 NOTE — Telephone Encounter (Signed)
Contacted patient and made aware of PSA results. 

## 2019-01-30 ENCOUNTER — Telehealth: Payer: Self-pay | Admitting: Urology

## 2019-01-30 NOTE — Telephone Encounter (Signed)
I called and left a message on the patient's voicemail regarding his request to cancel his routine scheduled one-month follow-up visit given concerns surrounding the present COVID-19 virus.  I advised that while I am happy to see him at any time should he want to reschedule, it is not critical to reschedule a follow-up visit.  He is welcome to call and discuss any questions or concerns that he has over the phone or if he is doing well and has recovered accordingly from his recent radiotherapy, we can just continue to follow him along with Dr. Alen Blew via correspondence and see him back on an as-needed basis should there be any further role for radiation in the future.   Nicholos Johns, MMS, PA-C [facility] [place]: [phone]  Fax: [phone]

## 2019-01-31 ENCOUNTER — Ambulatory Visit: Payer: Self-pay | Admitting: Urology

## 2019-02-01 ENCOUNTER — Telehealth: Payer: Self-pay | Admitting: Radiation Oncology

## 2019-02-01 NOTE — Telephone Encounter (Signed)
Returned patient's call. Patient denies fever, chills, diarrhea or constipation. Reports occasional nausea but no vomiting. Explained Ashlyn Bruning, PA-C does not believe his abdominal pain is radiation related and encourages him to try Gas X. Explained if no relief is felt in 24 hour with Gas X to follow up with his PCP. Patient verbalized understanding and expressed appreciation for the return call.

## 2019-02-01 NOTE — Telephone Encounter (Signed)
-----   Message from [place], [place] sent at 02/01/2019 12:06 PM EDT ----- Regarding: RE: Abdominal pain Sam, Please call the patient and advise that I do not think this is likely related to the radiation since we are now 1 month out from completion of treatment and he did not have any abdominal pain or GI issues during his course of treatment that I see documented. It could just be gas pains and he could try some OTC gasX to see if this helps.  It sounds like he is having normal BMs without N/V/Diarrhea or constipation but please inquire to confirm this is true.  If no bowel issues and no fever, chills, etc, I would advise trial of GasX and if no improvement in the next 24 hours, he should call his PCP to consider follow up for further evaluation. Thank you! -Ash ----- Message ----- From: Heywood Footman, RN Sent: 02/01/2019  10:42 AM EDT To: [name], PA-C Subject: Abdominal pain                                 Ashlyn.  Received a call that he has had abdominal pain for a couple days. Reports he had this same pain while receiving xofigo but it resolved without intervention. He reports the pain is to the left of his belly button approximately 3-4 inches down and feels like a huge gas bubble. He reports his abdomen is tender to the touch. He denies any bowel or bladder difficulties. He questions if he should be concerned.  63 y.o. male with chronic pelvic pain secondary to local progression of metastatic castrate resistant prostate cancer     Indication for treatment:  Palliative     Radiation treatment dates:   12/19/2018 - 01/01/2019  Site/dose:   The prostate and pubic rami metastases were treated to 30 Gy in 10 fractions of 3 Gy  Date: 09/21/2018                      DOB: [birth year]  [id number] Infusion Note  Diagnosis:  Castration resistant prostate cancer with painful bone involvement  Current Infusion:    6  Planned Infusions:  6  Sam

## 2019-02-01 NOTE — Telephone Encounter (Signed)
-----   Message from [place], [place] sent at 02/01/2019 12:06 PM EDT ----- Regarding: RE: Abdominal pain Gregory Schneider, Please call the patient and advise that I do not think this is likely related to the radiation since we are now 1 month out from completion of treatment and he did not have any abdominal pain or GI issues during his course of treatment that I see documented. It could just be gas pains and he could try some OTC gasX to see if this helps.  It sounds like he is having normal BMs without N/V/Diarrhea or constipation but please inquire to confirm this is true.  If no bowel issues and no fever, chills, etc, I would advise trial of GasX and if no improvement in the next 24 hours, he should call his PCP to consider follow up for further evaluation. Thank you! -Ash ----- Message ----- From: Heywood Footman, RN Sent: 02/01/2019  10:42 AM EDT To: [name], PA-C Subject: Abdominal pain                                 Ashlyn.  Received a call that he has had abdominal pain for a couple days. Reports he had this same pain while receiving xofigo but it resolved without intervention. He reports the pain is to the left of his belly button approximately 3-4 inches down and feels like a huge gas bubble. He reports his abdomen is tender to the touch. He denies any bowel or bladder difficulties. He questions if he should be concerned.  63 y.o. male with chronic pelvic pain secondary to local progression of metastatic castrate resistant prostate cancer     Indication for treatment:  Palliative     Radiation treatment dates:   12/19/2018 - 01/01/2019  Site/dose:   The prostate and pubic rami metastases were treated to 30 Gy in 10 fractions of 3 Gy  Date: 09/21/2018                      DOB: [birth year]  [id number] Infusion Note  Diagnosis:  Castration resistant prostate cancer with painful bone involvement  Current Infusion:    6  Planned Infusions:  6  Gregory Schneider

## 2019-02-08 ENCOUNTER — Other Ambulatory Visit: Payer: Self-pay | Admitting: Oncology

## 2019-02-08 DIAGNOSIS — C61 Malignant neoplasm of prostate: Secondary | ICD-10-CM

## 2019-02-27 ENCOUNTER — Telehealth: Payer: Self-pay

## 2019-02-27 MED FILL — XTANDI 40 MG CAPSULE: 40 | 30 days supply | Qty: 120 | Fill #0

## 2019-02-27 NOTE — Telephone Encounter (Signed)
Received message from patient inquiring about next appt. Contacted patient and provided 5/15 appointment information. Patient verbalized understanding and had no other questions or concerns.

## 2019-03-21 ENCOUNTER — Other Ambulatory Visit: Payer: Self-pay | Admitting: Oncology

## 2019-03-21 DIAGNOSIS — C61 Malignant neoplasm of prostate: Secondary | ICD-10-CM

## 2019-03-23 MED FILL — XTANDI 40 MG CAPSULE: 40 | 30 days supply | Qty: 120 | Fill #0

## 2019-03-30 ENCOUNTER — Inpatient Hospital Stay: Payer: Medicare HMO | Admitting: Oncology

## 2019-03-30 ENCOUNTER — Inpatient Hospital Stay: Payer: Medicare HMO | Attending: Oncology

## 2019-04-02 DIAGNOSIS — R69 Illness, unspecified: Secondary | ICD-10-CM | POA: Diagnosis not present

## 2019-04-09 ENCOUNTER — Telehealth: Payer: Self-pay | Admitting: Oncology

## 2019-04-09 NOTE — Telephone Encounter (Signed)
Scheduled appt per sch msg. Called and left msg for patient.  °

## 2019-04-18 ENCOUNTER — Inpatient Hospital Stay: Payer: Medicare HMO

## 2019-04-18 ENCOUNTER — Telehealth: Payer: Self-pay | Admitting: Oncology

## 2019-04-18 ENCOUNTER — Other Ambulatory Visit: Payer: Self-pay

## 2019-04-18 ENCOUNTER — Inpatient Hospital Stay: Payer: Medicare HMO | Attending: Oncology | Admitting: Oncology

## 2019-04-18 VITALS — BP 143/72 | HR 81 | Temp 97.5°F | Resp 18 | Ht 75.0 in | Wt 353.7 lb

## 2019-04-18 DIAGNOSIS — C61 Malignant neoplasm of prostate: Secondary | ICD-10-CM | POA: Diagnosis not present

## 2019-04-18 DIAGNOSIS — I10 Essential (primary) hypertension: Secondary | ICD-10-CM | POA: Insufficient documentation

## 2019-04-18 DIAGNOSIS — Z79899 Other long term (current) drug therapy: Secondary | ICD-10-CM | POA: Diagnosis not present

## 2019-04-18 LAB — CMP (CANCER CENTER ONLY)
ALT: 27 U/L (ref 0–44)
AST: 17 U/L (ref 15–41)
Albumin: 3.7 g/dL (ref 3.5–5.0)
Alkaline Phosphatase: 82 U/L (ref 38–126)
Anion gap: 10 (ref 5–15)
BUN: 14 mg/dL (ref 6–20)
CO2: 27 mmol/L (ref 22–32)
Calcium: 8.9 mg/dL (ref 8.9–10.3)
Chloride: 102 mmol/L (ref 98–111)
Creatinine: 0.87 mg/dL (ref 0.61–1.24)
GFR, Est AFR Am: >60 mL/min (ref >60)
GFR, Estimated: >60 mL/min (ref >60)
Glucose, Bld: 142 mg/dL — ABNORMAL HIGH (ref 70–99)
Potassium: 3.4 mmol/L — ABNORMAL LOW (ref 3.5–5.1)
Sodium: 139 mmol/L (ref 135–145)
Total Bilirubin: 0.5 mg/dL (ref 0.3–1.2)
Total Protein: 6.8 g/dL (ref 6.5–8.1)

## 2019-04-18 LAB — CBC WITH DIFFERENTIAL (CANCER CENTER ONLY)
Abs Immature Granulocytes: 0.02 10*3/uL (ref 0.00–0.07)
Basophils Absolute: 0 10*3/uL (ref 0.0–0.1)
Basophils Relative: 0 %
Eosinophils Absolute: 0.1 10*3/uL (ref 0.0–0.5)
Eosinophils Relative: 2 %
HCT: 34.1 % — ABNORMAL LOW (ref 39.0–52.0)
Hemoglobin: 11.5 g/dL — ABNORMAL LOW (ref 13.0–17.0)
Immature Granulocytes: 1 %
Lymphocytes Relative: 17 %
Lymphs Abs: 0.7 10*3/uL (ref 0.7–4.0)
MCH: 31.9 pg (ref 26.0–34.0)
MCHC: 33.7 g/dL (ref 30.0–36.0)
MCV: 94.7 fL (ref 80.0–100.0)
Monocytes Absolute: 0.3 10*3/uL (ref 0.1–1.0)
Monocytes Relative: 6 %
Neutro Abs: 3.3 10*3/uL (ref 1.7–7.7)
Neutrophils Relative %: 74 %
Platelet Count: 113 10*3/uL — ABNORMAL LOW (ref 150–400)
RBC: 3.6 MIL/uL — ABNORMAL LOW (ref 4.22–5.81)
RDW: 14.4 % (ref 11.5–15.5)
WBC Count: 4.4 10*3/uL (ref 4.0–10.5)
nRBC: 0 % (ref 0.0–0.2)

## 2019-04-18 NOTE — Progress Notes (Signed)
Hematology and Oncology Follow Up Visit  Gregory Schneider [id number] Oct 13, 1956 63 y.o. 04/18/2019 10:32 AM   Principle Diagnosis: 63 year old man with advanced prostate cancer with disease to the bone diagnosed in 2010.  He developed castration-resistant in 2013 after presenting with PSA of 15 and Gleason score 4 + 5 = 9 in 2010 at the time of diagnosis.    Prior Therapy:  Combined androgen deprivation with Lupron and Casodex. He had a good response and then developed a rise in the PSA with castrate level testosterone.  He is S/P Bilateral simple orchiectomy done on 12/25/2012.  Provenge started on 03/03/12. Therapy Ended in 03/31/2012.  Zytiga started in 10/2013. Therapy discontinued in January 2018 because of progression of disease. Xofigo infusion to start on Apr 13, 2018.  He completed 6 months of therapy in November 2019. Status post prostate radiation between December 19, 2018 and January 01, 2019.  He received total of 30 Gy in 10 fractions to the prostate and the pubic rami.  Current therapy:   Xtandi 160 mg daily started in February 2018.    Interim History:  Gregory Schneider returns today for a repeat evaluation.  Since the last visit, he reports no major changes in his health.  He stopped the hydrochlorothiazide blood pressure medication with improvement in his overall quality of life.  He reports less fatigue, tiredness and dizziness.  His appetite about the same without any major changes in his weight.  He did lose 3 pounds some of it is intentionally.  He denies any worsening bone pain that is manageable with hydrocodone.  He continues to tolerate Xtandi without any major issues.  He denies any worsening urinary issues or pelvic discomfort.  Denies any worsening edema or seizures.  He denied headaches, blurry vision, syncope or seizures.  Denies any fevers, chills or sweats.  Denied chest pain, palpitation, orthopnea or leg edema.  Denied cough, wheezing or hemoptysis.  Denied nausea,  vomiting or abdominal pain.  Denies any constipation or diarrhea.  Denies any frequency urgency or hesitancy.  Denies any arthralgias or myalgias.  Denies any skin rashes or lesions.  Denies any bleeding or clotting tendency.  Denies any easy bruising.  Denies any hair or nail changes.  Denies any anxiety or depression.  Remaining review of system is negative.        Medications: Reviewed today and no changes. Current Outpatient Medications  Medication Sig Dispense Refill  . buPROPion (ZYBAN) 150 MG 12 hr tablet Take 150 mg by mouth.    . calcium carbonate (OS-CAL) 600 MG TABS Take 600 mg by mouth daily.    . cholecalciferol (VITAMIN D) 1000 UNITS tablet Take 1,000 Units by mouth daily.    . clonazePAM (KLONOPIN) 1 MG tablet Take 1 mg by mouth as needed for anxiety (1-2 tablets daily as needed).     . Cyanocobalamin (RA VITAMIN B-12 TR) 1000 MCG TBCR Take by mouth.    . gabapentin (NEURONTIN) 300 MG capsule Take 1 capsule (300 mg total) by mouth 2 (two) times daily. 90 capsule 3  . HYDROcodone-acetaminophen (NORCO/VICODIN) 5-325 MG tablet Take 1-2 tablets by mouth every 6 (six) hours as needed. 90 tablet 0  . losartan-hydrochlorothiazide (HYZAAR) 100-25 MG tablet   0  . Multiple Vitamin (MULTIVITAMIN) tablet Take 1 tablet by mouth daily.    . prochlorperazine (COMPAZINE) 10 MG tablet Take 1 tablet (10 mg total) by mouth every 6 (six) hours as needed for nausea or vomiting. (Patient not taking:  Reported on 12/12/2018) 30 tablet 3  . sertraline (ZOLOFT) 100 MG tablet Take 200 mg by mouth every morning.     Gillermina Phy 40 MG capsule TAKE 4 CAPSULES (160 MG TOTAL) BY MOUTH DAILY. 120 capsule 0   No current facility-administered medications for this visit.     Allergies:  Allergies  Allergen Reactions  . Penicillins Other (See Comments)    Patient stated,"I don't remember what kind of reaction because I was a kid."    His past medical history, social history updated without changes  today.  Physical Exam:   Blood pressure (!) 143/72, pulse 81, temperature (!) 97.5 F (36.4 C), temperature source Oral, resp. rate 18, height 6\' 3"  (1.905 m), weight (!) 353 lb 11.2 oz (160.4 kg), SpO2 98 %.     ECOG: 1    General appearance: Comfortable appearing without any discomfort Head: Normocephalic without any trauma Oropharynx: Mucous membranes are moist and pink without any thrush or ulcers. Eyes: Pupils are equal and round reactive to light. Lymph nodes: No cervical, supraclavicular, inguinal or axillary lymphadenopathy.   Heart:regular rate and rhythm.  S1 and S2 without leg edema. Lung: Clear without any rhonchi or wheezes.  No dullness to percussion. Abdomin: Soft, nontender, nondistended with good bowel sounds.  No hepatosplenomegaly. Musculoskeletal: No joint deformity or effusion.  Full range of motion noted. Neurological: No deficits noted on motor, sensory and deep tendon reflex exam. Skin: No petechial rash or dryness.  Appeared moist.           Lab Results: Lab Results  Component Value Date   WBC 4.4 04/18/2019   HGB 11.5 (L) 04/18/2019   HCT 34.1 (L) 04/18/2019   MCV 94.7 04/18/2019   PLT 113 (L) 04/18/2019     Chemistry      Component Value Date/Time   NA 137 01/18/2019 0921   NA 136 11/17/2017 0956   K 3.4 (L) 01/18/2019 0921   K 3.8 11/17/2017 0956   CL 98 01/18/2019 0921   CL 104 03/09/2013 0921   CO2 30 01/18/2019 0921   CO2 28 11/17/2017 0956   BUN 20 01/18/2019 0921   BUN 19.7 11/17/2017 0956   CREATININE 0.96 01/18/2019 0921   CREATININE 0.85 11/29/2018 1109   CREATININE 0.9 11/17/2017 0956      Component Value Date/Time   CALCIUM 9.6 01/18/2019 0921   CALCIUM 9.5 11/17/2017 0956   ALKPHOS 74 01/18/2019 0921   ALKPHOS 89 11/17/2017 0956   AST 27 01/18/2019 0921   AST 25 11/29/2018 1109   AST 21 11/17/2017 0956   ALT 46 (H) 01/18/2019 0921   ALT 46 (H) 11/29/2018 1109   ALT 47 11/17/2017 0956   BILITOT 0.7  01/18/2019 0921   BILITOT 0.7 11/29/2018 1109   BILITOT 0.39 11/17/2017 0956      Results for Schneider, Gregory (MRN [id number]) as of 04/18/2019 10:34  Ref. Range 11/29/2018 11:09 01/18/2019 09:21  Prostate Specific Ag, Serum Latest Ref Range: 0.0 - 4.0 ng/mL 13.9 (H) 10.1 (H)      Impression and Plan:    63 year old man with  1.  Advanced prostate cancer with disease to the bone and local recurrence diagnosed in 2013.  He has castration-resistant with therapy outlined above.  He remains on Xtandi without any major complications at this time.  His last PSA did show some slight decline indicating treating his local disease.  The natural course of his disease was discussed today and alternative treatment options were  also reviewed.  Different salvage therapy such as Taxotere chemotherapy will be a possibility if he developed systemic progression.  At the time being we will continue Xtandi at this time.   2. Androgen deprivation: He continues to have castrate level testosterone after orchiectomy based on laboratory testing in January 2020.  3.  Bone directed therapy: I recommended continuing calcium and vitamin D supplements.  He deferred Zometa option after he has been receiving it.  4. Hypertension: His blood pressure is manageable at this time without hydrochlorothiazide.  I urged him to continue to monitor his blood pressure periodically.  5.  Prognosis and goals of care: Therapy remains palliative although aggressive therapy is warranted given his excellent performance status.  6.  Bone pain: Related to prostate cancer disease to the bone and currently on hydrocodone which is adequately controlled.  7. Follow up: We will be in 2 months for repeat evaluation.  25 minutes was spent with the patient face-to-face today.  More than 50% of time was spent on reviewing his disease status, treatment options, reviewing laboratory data and answering questions regarding future plan of  care.    Zola Button MD 6/3/202010:32 AM

## 2019-04-18 NOTE — Telephone Encounter (Signed)
Scheduled appt per sch msg. Called and left msg.  °

## 2019-04-19 ENCOUNTER — Telehealth: Payer: Self-pay

## 2019-04-19 LAB — PROSTATE-SPECIFIC AG, SERUM (LABCORP): Prostate Specific Ag, Serum: 16.4 ng/mL — ABNORMAL HIGH (ref 0.0–4.0)

## 2019-04-19 NOTE — Telephone Encounter (Signed)
-----   Message from Wyatt Portela, MD sent at 04/19/2019  8:03 AM EDT ----- Please let him know his PSA is slightly up. No changes for now.

## 2019-04-19 NOTE — Telephone Encounter (Signed)
Contacted patient and made aware of PSA results. 

## 2019-04-20 ENCOUNTER — Telehealth: Payer: Self-pay

## 2019-04-20 ENCOUNTER — Other Ambulatory Visit: Payer: Self-pay | Admitting: Oncology

## 2019-04-20 DIAGNOSIS — C61 Malignant neoplasm of prostate: Secondary | ICD-10-CM

## 2019-04-20 DIAGNOSIS — C7951 Secondary malignant neoplasm of bone: Secondary | ICD-10-CM

## 2019-04-20 MED ORDER — HYDROCODONE-ACETAMINOPHEN 5-325 MG PO TABS: 1.0000 | ORAL_TABLET | Freq: Four times a day (QID) | ORAL | 0 refills | Status: DC | PRN

## 2019-04-20 NOTE — Telephone Encounter (Signed)
Received message from patient requesting that refill for Norco be sent to pharmacy and that he would like to know his PSA results. Dr. Alen Blew made aware and refill sent to pharmacy and return message left for patient with PSA level and to call back if there are any questions or concerns.

## 2019-04-24 DIAGNOSIS — Z6841 Body Mass Index (BMI) 40.0 and over, adult: Secondary | ICD-10-CM | POA: Diagnosis not present

## 2019-04-24 DIAGNOSIS — C61 Malignant neoplasm of prostate: Secondary | ICD-10-CM | POA: Diagnosis not present

## 2019-04-24 DIAGNOSIS — D649 Anemia, unspecified: Secondary | ICD-10-CM | POA: Diagnosis not present

## 2019-04-24 DIAGNOSIS — I10 Essential (primary) hypertension: Secondary | ICD-10-CM | POA: Diagnosis not present

## 2019-04-24 DIAGNOSIS — R739 Hyperglycemia, unspecified: Secondary | ICD-10-CM | POA: Diagnosis not present

## 2019-04-24 DIAGNOSIS — C7951 Secondary malignant neoplasm of bone: Secondary | ICD-10-CM | POA: Diagnosis not present

## 2019-04-24 DIAGNOSIS — R69 Illness, unspecified: Secondary | ICD-10-CM | POA: Diagnosis not present

## 2019-04-24 DIAGNOSIS — Z79899 Other long term (current) drug therapy: Secondary | ICD-10-CM | POA: Diagnosis not present

## 2019-04-26 DIAGNOSIS — R69 Illness, unspecified: Secondary | ICD-10-CM | POA: Diagnosis not present

## 2019-05-07 ENCOUNTER — Other Ambulatory Visit: Payer: Self-pay | Admitting: Oncology

## 2019-05-07 DIAGNOSIS — C61 Malignant neoplasm of prostate: Secondary | ICD-10-CM

## 2019-05-14 MED FILL — XTANDI 40 MG CAPSULE: 40 | 30 days supply | Qty: 120 | Fill #0

## 2019-06-08 ENCOUNTER — Other Ambulatory Visit: Payer: Self-pay | Admitting: Oncology

## 2019-06-08 DIAGNOSIS — C61 Malignant neoplasm of prostate: Secondary | ICD-10-CM

## 2019-06-12 MED FILL — XTANDI 40 MG CAPSULE: 40 | 30 days supply | Qty: 120 | Fill #0

## 2019-06-27 ENCOUNTER — Inpatient Hospital Stay: Payer: Medicare HMO

## 2019-06-27 ENCOUNTER — Other Ambulatory Visit: Payer: Self-pay

## 2019-06-27 ENCOUNTER — Inpatient Hospital Stay: Payer: Medicare HMO | Attending: Oncology | Admitting: Oncology

## 2019-06-27 VITALS — BP 142/84 | HR 81 | Temp 97.9°F | Resp 18 | Ht 75.0 in | Wt 345.0 lb

## 2019-06-27 DIAGNOSIS — C61 Malignant neoplasm of prostate: Secondary | ICD-10-CM

## 2019-06-27 DIAGNOSIS — C7951 Secondary malignant neoplasm of bone: Secondary | ICD-10-CM | POA: Diagnosis not present

## 2019-06-27 DIAGNOSIS — Z79899 Other long term (current) drug therapy: Secondary | ICD-10-CM | POA: Insufficient documentation

## 2019-06-27 DIAGNOSIS — I10 Essential (primary) hypertension: Secondary | ICD-10-CM | POA: Insufficient documentation

## 2019-06-27 DIAGNOSIS — G629 Polyneuropathy, unspecified: Secondary | ICD-10-CM | POA: Insufficient documentation

## 2019-06-27 LAB — CMP (CANCER CENTER ONLY)
ALT: 35 U/L (ref 0–44)
AST: 19 U/L (ref 15–41)
Albumin: 4.3 g/dL (ref 3.5–5.0)
Alkaline Phosphatase: 99 U/L (ref 38–126)
Anion gap: 13 (ref 5–15)
BUN: 16 mg/dL (ref 6–20)
CO2: 22 mmol/L (ref 22–32)
Calcium: 9.5 mg/dL (ref 8.9–10.3)
Chloride: 102 mmol/L (ref 98–111)
Creatinine: 0.97 mg/dL (ref 0.61–1.24)
GFR, Est AFR Am: >60 mL/min (ref >60)
GFR, Estimated: >60 mL/min (ref >60)
Glucose, Bld: 119 mg/dL — ABNORMAL HIGH (ref 70–99)
Potassium: 4.3 mmol/L (ref 3.5–5.1)
Sodium: 137 mmol/L (ref 135–145)
Total Bilirubin: 0.5 mg/dL (ref 0.3–1.2)
Total Protein: 7.4 g/dL (ref 6.5–8.1)

## 2019-06-27 LAB — CBC WITH DIFFERENTIAL (CANCER CENTER ONLY)
Abs Immature Granulocytes: 0.03 10*3/uL (ref 0.00–0.07)
Basophils Absolute: 0 10*3/uL (ref 0.0–0.1)
Basophils Relative: 1 %
Eosinophils Absolute: 0.1 10*3/uL (ref 0.0–0.5)
Eosinophils Relative: 1 %
HCT: 38.6 % — ABNORMAL LOW (ref 39.0–52.0)
Hemoglobin: 13.2 g/dL (ref 13.0–17.0)
Immature Granulocytes: 1 %
Lymphocytes Relative: 22 %
Lymphs Abs: 1.4 10*3/uL (ref 0.7–4.0)
MCH: 31.4 pg (ref 26.0–34.0)
MCHC: 34.2 g/dL (ref 30.0–36.0)
MCV: 91.9 fL (ref 80.0–100.0)
Monocytes Absolute: 0.5 10*3/uL (ref 0.1–1.0)
Monocytes Relative: 8 %
Neutro Abs: 4.3 10*3/uL (ref 1.7–7.7)
Neutrophils Relative %: 67 %
Platelet Count: 139 10*3/uL — ABNORMAL LOW (ref 150–400)
RBC: 4.2 MIL/uL — ABNORMAL LOW (ref 4.22–5.81)
RDW: 14.5 % (ref 11.5–15.5)
WBC Count: 6.4 10*3/uL (ref 4.0–10.5)
nRBC: 0 % (ref 0.0–0.2)

## 2019-06-27 MED ORDER — HYDROCODONE-ACETAMINOPHEN 5-325 MG PO TABS: 1.0000 | ORAL_TABLET | Freq: Four times a day (QID) | ORAL | 0 refills | Status: DC | PRN

## 2019-06-27 NOTE — Progress Notes (Signed)
Hematology and Oncology Follow Up Visit  Gregory Schneider [id number] 12-Dec-1955 63 y.o. 06/27/2019 9:14 AM   Principle Diagnosis: 63 year old man with castration-resistant prostate cancer with disease to the bone.  He was diagnosed in 2010 with PSA of 15 and Gleason score 4 + 5 = 9 in 2010.   Prior Therapy:  Combined androgen deprivation with Lupron and Casodex. He had a good response and then developed a rise in the PSA with castrate level testosterone.  He is S/P Bilateral simple orchiectomy done on 12/25/2012.  Provenge started on 03/03/12. Therapy Ended in 03/31/2012.  Zytiga started in 10/2013. Therapy discontinued in January 2018 because of progression of disease. Xofigo infusion to start on Apr 13, 2018.  He completed 6 months of therapy in November 2019. Status post prostate radiation between December 19, 2018 and January 01, 2019.  He received total of 30 Gy in 10 fractions to the prostate and the pubic rami.  Current therapy:   Xtandi 160 mg daily started in February 2018.    Interim History:  [name] is here for a follow-up.  He reports no major changes in his health.  He does report some fatigue and some tiredness and occasional hot flashes associated with Xtandi.  He denies any worsening bone pain but does report some arthralgias in his thighs.  He denies any pelvic discomfort, hematochezia or melena.  His quality of life remains intact.  Patient denied any alteration mental status, neuropathy, confusion or dizziness.  Denies any headaches or lethargy.  Denies any night sweats, weight loss or changes in appetite.  Denied orthopnea, dyspnea on exertion or chest discomfort.  Denies shortness of breath, difficulty breathing hemoptysis or cough.  Denies any abdominal distention, nausea, early satiety or dyspepsia.  Denies any hematuria, frequency, dysuria or nocturia.  Denies any skin irritation, dryness or rash.  Denies any ecchymosis or petechiae.  Denies any lymphadenopathy or  clotting.  Denies any heat or cold intolerance.  Denies any anxiety or depression.  Remaining review of system is negative.            Medications: Updated today on review. Current Outpatient Medications  Medication Sig Dispense Refill  . buPROPion (ZYBAN) 150 MG 12 hr tablet Take 150 mg by mouth.    . calcium carbonate (OS-CAL) 600 MG TABS Take 600 mg by mouth daily.    . cholecalciferol (VITAMIN D) 1000 UNITS tablet Take 1,000 Units by mouth daily.    . clonazePAM (KLONOPIN) 1 MG tablet Take 1 mg by mouth as needed for anxiety (1-2 tablets daily as needed).     . Cyanocobalamin (RA VITAMIN B-12 TR) 1000 MCG TBCR Take by mouth.    . gabapentin (NEURONTIN) 300 MG capsule Take 1 capsule (300 mg total) by mouth 2 (two) times daily. 90 capsule 3  . HYDROcodone-acetaminophen (NORCO/VICODIN) 5-325 MG tablet Take 1-2 tablets by mouth every 6 (six) hours as needed. 90 tablet 0  . losartan-hydrochlorothiazide (HYZAAR) 100-25 MG tablet   0  . Multiple Vitamin (MULTIVITAMIN) tablet Take 1 tablet by mouth daily.    . prochlorperazine (COMPAZINE) 10 MG tablet Take 1 tablet (10 mg total) by mouth every 6 (six) hours as needed for nausea or vomiting. (Patient not taking: Reported on 12/12/2018) 30 tablet 3  . sertraline (ZOLOFT) 100 MG tablet Take 200 mg by mouth every morning.     Gillermina Phy 40 MG capsule TAKE 4 CAPSULES (160 MG TOTAL) BY MOUTH DAILY. 120 capsule 0   No  current facility-administered medications for this visit.     Allergies:  Allergies  Allergen Reactions  . Penicillins Other (See Comments)    Patient stated,"I don't remember what kind of reaction because I was a kid."    His past medical history, social history and family history remains without changes on review.  y.  Physical Exam:    Blood pressure (!) 142/84, pulse 81, temperature 97.9 F (36.6 C), temperature source Oral, resp. rate 18, height 6\' 3"  (1.905 m), weight (!) 345 lb (156.5 kg), SpO2 97 %.     ECOG:  1     General appearance: Alert, awake without any distress. Head: Atraumatic without abnormalities Oropharynx: Without any thrush or ulcers. Eyes: No scleral icterus. Lymph nodes: No lymphadenopathy noted in the cervical, supraclavicular, or axillary nodes Heart:regular rate and rhythm, without any murmurs or gallops.   Lung: Clear to auscultation without any rhonchi, wheezes or dullness to percussion. Abdomin: Soft, nontender without any shifting dullness or ascites. Musculoskeletal: No clubbing or cyanosis. Neurological: No motor or sensory deficits. Skin: No rashes or lesions.           Lab Results: Lab Results  Component Value Date   WBC 4.4 04/18/2019   HGB 11.5 (L) 04/18/2019   HCT 34.1 (L) 04/18/2019   MCV 94.7 04/18/2019   PLT 113 (L) 04/18/2019     Chemistry      Component Value Date/Time   NA 139 04/18/2019 1018   NA 136 11/17/2017 0956   K 3.4 (L) 04/18/2019 1018   K 3.8 11/17/2017 0956   CL 102 04/18/2019 1018   CL 104 03/09/2013 0921   CO2 27 04/18/2019 1018   CO2 28 11/17/2017 0956   BUN 14 04/18/2019 1018   BUN 19.7 11/17/2017 0956   CREATININE 0.87 04/18/2019 1018   CREATININE 0.9 11/17/2017 0956      Component Value Date/Time   CALCIUM 8.9 04/18/2019 1018   CALCIUM 9.5 11/17/2017 0956   ALKPHOS 82 04/18/2019 1018   ALKPHOS 89 11/17/2017 0956   AST 17 04/18/2019 1018   AST 21 11/17/2017 0956   ALT 27 04/18/2019 1018   ALT 47 11/17/2017 0956   BILITOT 0.5 04/18/2019 1018   BILITOT 0.39 11/17/2017 0956      Results for BRAYLN, [name] (MRN [id number]) as of 06/27/2019 08:51  Ref. Range 11/29/2018 11:09 01/18/2019 09:21 04/18/2019 10:18  Prostate Specific Ag, Serum Latest Ref Range: 0.0 - 4.0 ng/mL 13.9 (H) 10.1 (H) 16.4 (H)       Impression and Plan:    63 year old man with  1.  Castration-resistant prostate cancer with disease to the bone and pelvic adenopathy diagnosed 2013.   Status post therapy outlined above and currently on  Xtandi without any major complaints.  His last PSA did show slight increase and treatment options were reiterated.  Salvage chemotherapy with Taxotere remains his best option if his disease develops symptomatic progression.  Complication associated with this therapy was reviewed today which include neuropathy and excessive fatigue.  After discussion we will restaging with CT scan with bone scan for the next visit and determine the next course of action after that.  He will stay on Xtandi for the time being.   2. Androgen deprivation: He is status post orchiectomy without any need for additional androgen deprivation.  3.  Bone directed therapy: Remains on calcium and vitamin D supplements.  He declined further Zometa.  4. Hypertension: He discontinued hydrochlorothiazide and currently off any antihypertensive medication.  Blood pressure is manageable.  5.  Prognosis and goals of care: His disease is incurable at therapy although aggressive measures are warranted.  6.  Bone pain: Manageable at this time with hydrocodone and related to his bone metastasis.  7. Follow up: Will be in 2 months for repeat evaluation.   25 minutes was spent with the patient face-to-face today.  More than 50% of time was spent on reviewing his disease status, treatment options, reviewing laboratory data and answering questions regarding future plan of care.    Zola Button MD 8/12/20209:14 AM

## 2019-06-27 NOTE — Addendum Note (Signed)
Addended by: Wyatt Portela on: 06/27/2019 09:39 AM   Modules accepted: Orders

## 2019-06-28 ENCOUNTER — Telehealth: Payer: Self-pay | Admitting: Oncology

## 2019-06-28 LAB — PROSTATE-SPECIFIC AG, SERUM (LABCORP): Prostate Specific Ag, Serum: 45.8 ng/mL — ABNORMAL HIGH (ref 0.0–4.0)

## 2019-06-28 NOTE — Telephone Encounter (Signed)
Called and left msg. Mailed printout  °

## 2019-06-29 ENCOUNTER — Telehealth: Payer: Self-pay

## 2019-06-29 NOTE — Telephone Encounter (Signed)
Received call from patient requesting PSA results. Contacted patient and made him aware of his results and that Dr. Alen Blew does not want to change anything for now but will get the scans done first and then follow up. Patient verbalized understanding and had no other questions or concerns.

## 2019-06-29 NOTE — Telephone Encounter (Signed)
-----   Message from Wyatt Portela, MD sent at 06/29/2019  1:07 PM EDT ----- Regarding: RE: Requesting PSA results from 8/12 visit Please let him know his PSA is up to 45. We are getting a CT and bone scan to evaluate. No changes for now. Thanks.  ----- Message ----- From: Scot Dock, RN Sent: 06/29/2019   1:00 PM EDT To: Wyatt Portela, MD Subject: Requesting PSA results from 8/12 visit

## 2019-07-07 DIAGNOSIS — R69 Illness, unspecified: Secondary | ICD-10-CM | POA: Diagnosis not present

## 2019-07-09 ENCOUNTER — Other Ambulatory Visit: Payer: Self-pay | Admitting: Oncology

## 2019-07-09 DIAGNOSIS — C61 Malignant neoplasm of prostate: Secondary | ICD-10-CM

## 2019-07-12 MED FILL — XTANDI 40 MG CAPSULE: 40 | 30 days supply | Qty: 120 | Fill #0

## 2019-07-13 ENCOUNTER — Telehealth: Payer: Self-pay

## 2019-07-13 NOTE — Telephone Encounter (Signed)
Pt called asking about possible therapies he would be prescribed to treat cancer diagnosis. Pt has orders for scan on 08/28/19 and follow-up with [name] on 08/31/19. Informed patient that treatment options can be dependent on upcoming scan and will be discussed during follow-up visit on 08/31/19. Pt verbalized understanding.

## 2019-07-26 ENCOUNTER — Telehealth: Payer: Self-pay

## 2019-07-26 NOTE — Telephone Encounter (Signed)
Received message from patient requesting follow up MD appt. Left return message with scheduled appt dates and times and that the ordered scans are pending authorization and once authorized will be scheduled. If any further questions or concerns to call back.

## 2019-07-30 ENCOUNTER — Telehealth: Payer: Self-pay

## 2019-07-30 NOTE — Telephone Encounter (Signed)
Called and spoke to [place] in [place] to schedule patient's Bone scan and CT scan. Scans scheduled for 08/28/19. Patient aware of lab at 9:00 at [facility] followed by bone scan injection at 10:00, then CT scan at 11:30, then Bone scan at 1:00. Nothing to eat or drink after 7 am. Patient verbalized understanding and will get contrast for CT scan after lab appointment.

## 2019-08-07 ENCOUNTER — Other Ambulatory Visit: Payer: Self-pay | Admitting: Oncology

## 2019-08-07 DIAGNOSIS — C7951 Secondary malignant neoplasm of bone: Secondary | ICD-10-CM

## 2019-08-07 DIAGNOSIS — C61 Malignant neoplasm of prostate: Secondary | ICD-10-CM

## 2019-08-17 ENCOUNTER — Other Ambulatory Visit: Payer: Self-pay | Admitting: Oncology

## 2019-08-17 DIAGNOSIS — C61 Malignant neoplasm of prostate: Secondary | ICD-10-CM

## 2019-08-20 MED FILL — XTANDI 40 MG CAPSULE: 40 | 30 days supply | Qty: 120 | Fill #0

## 2019-08-28 ENCOUNTER — Ambulatory Visit (HOSPITAL_COMMUNITY)
Admission: RE | Admit: 2019-08-28 | Discharge: 2019-08-28 | Disposition: A | Discharge status: Home / Self Care | Payer: Medicare HMO | Source: Ambulatory Visit | Attending: Oncology | Admitting: Oncology

## 2019-08-28 ENCOUNTER — Inpatient Hospital Stay: Payer: Medicare HMO | Attending: Oncology

## 2019-08-28 ENCOUNTER — Other Ambulatory Visit: Payer: Self-pay

## 2019-08-28 DIAGNOSIS — Z79899 Other long term (current) drug therapy: Secondary | ICD-10-CM | POA: Insufficient documentation

## 2019-08-28 DIAGNOSIS — C61 Malignant neoplasm of prostate: Secondary | ICD-10-CM | POA: Diagnosis not present

## 2019-08-28 DIAGNOSIS — R9721 Rising PSA following treatment for malignant neoplasm of prostate: Secondary | ICD-10-CM | POA: Diagnosis not present

## 2019-08-28 DIAGNOSIS — C7951 Secondary malignant neoplasm of bone: Secondary | ICD-10-CM | POA: Insufficient documentation

## 2019-08-28 DIAGNOSIS — Z9079 Acquired absence of other genital organ(s): Secondary | ICD-10-CM | POA: Diagnosis not present

## 2019-08-28 LAB — CMP (CANCER CENTER ONLY)
ALT: 28 U/L (ref 0–44)
AST: 17 U/L (ref 15–41)
Albumin: 3.9 g/dL (ref 3.5–5.0)
Alkaline Phosphatase: 107 U/L (ref 38–126)
Anion gap: 11 (ref 5–15)
BUN: 18 mg/dL (ref 8–23)
CO2: 28 mmol/L (ref 22–32)
Calcium: 9 mg/dL (ref 8.9–10.3)
Chloride: 102 mmol/L (ref 98–111)
Creatinine: 0.99 mg/dL (ref 0.61–1.24)
GFR, Est AFR Am: >60 mL/min (ref >60)
GFR, Est Non Af Am: >60 mL/min (ref >60)
Glucose, Bld: 136 mg/dL — ABNORMAL HIGH (ref 70–99)
Potassium: 3.9 mmol/L (ref 3.5–5.1)
Sodium: 141 mmol/L (ref 135–145)
Total Bilirubin: 0.4 mg/dL (ref 0.3–1.2)
Total Protein: 7.1 g/dL (ref 6.5–8.1)

## 2019-08-28 LAB — CBC WITH DIFFERENTIAL (CANCER CENTER ONLY)
Abs Immature Granulocytes: 0.03 10*3/uL (ref 0.00–0.07)
Basophils Absolute: 0 10*3/uL (ref 0.0–0.1)
Basophils Relative: 0 %
Eosinophils Absolute: 0.1 10*3/uL (ref 0.0–0.5)
Eosinophils Relative: 2 %
HCT: 36.6 % — ABNORMAL LOW (ref 39.0–52.0)
Hemoglobin: 12.5 g/dL — ABNORMAL LOW (ref 13.0–17.0)
Immature Granulocytes: 1 %
Lymphocytes Relative: 17 %
Lymphs Abs: 0.9 10*3/uL (ref 0.7–4.0)
MCH: 31.5 pg (ref 26.0–34.0)
MCHC: 34.2 g/dL (ref 30.0–36.0)
MCV: 92.2 fL (ref 80.0–100.0)
Monocytes Absolute: 0.3 10*3/uL (ref 0.1–1.0)
Monocytes Relative: 5 %
Neutro Abs: 4.1 10*3/uL (ref 1.7–7.7)
Neutrophils Relative %: 75 %
Platelet Count: 121 10*3/uL — ABNORMAL LOW (ref 150–400)
RBC: 3.97 MIL/uL — ABNORMAL LOW (ref 4.22–5.81)
RDW: 13.8 % (ref 11.5–15.5)
WBC Count: 5.4 10*3/uL (ref 4.0–10.5)
nRBC: 0 % (ref 0.0–0.2)

## 2019-08-28 MED ORDER — SODIUM CHLORIDE (PF) 0.9 % IJ SOLN
INTRAMUSCULAR | Status: AC
  Filled 2019-08-28: qty 50

## 2019-08-28 MED ORDER — IOHEXOL 300 MG/ML  SOLN
30.0000 mL | Freq: Once | INTRAMUSCULAR | Status: AC | PRN
  Administered 2019-08-28: 30 mL via ORAL

## 2019-08-28 MED ORDER — TECHNETIUM TC 99M MEDRONATE IV KIT
20.7000 | PACK | Freq: Once | INTRAVENOUS | Status: AC | PRN
  Administered 2019-08-28: 10:00:00 20.7 via INTRAVENOUS

## 2019-08-28 MED ORDER — IOHEXOL 300 MG/ML  SOLN
100.0000 mL | Freq: Once | INTRAMUSCULAR | Status: AC | PRN
  Administered 2019-08-28: 100 mL via INTRAVENOUS

## 2019-08-29 LAB — PROSTATE-SPECIFIC AG, SERUM (LABCORP): Prostate Specific Ag, Serum: 89.8 ng/mL — ABNORMAL HIGH (ref 0.0–4.0)

## 2019-08-31 ENCOUNTER — Inpatient Hospital Stay (HOSPITAL_BASED_OUTPATIENT_CLINIC_OR_DEPARTMENT_OTHER): Payer: Medicare HMO | Admitting: Oncology

## 2019-08-31 ENCOUNTER — Telehealth: Payer: Self-pay

## 2019-08-31 ENCOUNTER — Telehealth: Payer: Self-pay | Admitting: Oncology

## 2019-08-31 ENCOUNTER — Other Ambulatory Visit: Payer: Self-pay

## 2019-08-31 VITALS — BP 134/77 | HR 72 | Temp 97.8°F | Resp 18 | Ht 75.0 in | Wt 347.6 lb

## 2019-08-31 DIAGNOSIS — C61 Malignant neoplasm of prostate: Secondary | ICD-10-CM

## 2019-08-31 DIAGNOSIS — Z79899 Other long term (current) drug therapy: Secondary | ICD-10-CM | POA: Diagnosis not present

## 2019-08-31 DIAGNOSIS — C7951 Secondary malignant neoplasm of bone: Secondary | ICD-10-CM | POA: Diagnosis not present

## 2019-08-31 DIAGNOSIS — Z7189 Other specified counseling: Secondary | ICD-10-CM

## 2019-08-31 DIAGNOSIS — R9721 Rising PSA following treatment for malignant neoplasm of prostate: Secondary | ICD-10-CM | POA: Diagnosis not present

## 2019-08-31 DIAGNOSIS — Z9079 Acquired absence of other genital organ(s): Secondary | ICD-10-CM | POA: Diagnosis not present

## 2019-08-31 MED ORDER — PROCHLORPERAZINE MALEATE 10 MG PO TABS: 10.0000 mg | ORAL_TABLET | Freq: Four times a day (QID) | ORAL | 3 refills | Status: AC | PRN

## 2019-08-31 MED ORDER — GABAPENTIN 300 MG PO CAPS: 300.0000 mg | ORAL_CAPSULE | Freq: Every day | ORAL | 3 refills | Status: DC

## 2019-08-31 MED ORDER — LIDOCAINE-PRILOCAINE 2.5-2.5 % EX CREA: 1.0000 "application " | TOPICAL_CREAM | CUTANEOUS | 0 refills | Status: AC | PRN

## 2019-08-31 MED ORDER — PROCHLORPERAZINE MALEATE 10 MG PO TABS: 10.0000 mg | ORAL_TABLET | Freq: Four times a day (QID) | ORAL | 3 refills | Status: DC | PRN

## 2019-08-31 MED ORDER — HYDROCODONE-ACETAMINOPHEN 5-325 MG PO TABS: 1.0000 | ORAL_TABLET | Freq: Four times a day (QID) | ORAL | 0 refills | Status: DC | PRN

## 2019-08-31 NOTE — Progress Notes (Signed)
START ON PATHWAY REGIMEN - Prostate     A cycle is every 21 days:     Docetaxel      Prednisone   **Always confirm dose/schedule in your pharmacy ordering system**  Patient Characteristics: Adenocarcinoma, Distant Metastases, Castration Resistant, Symptomatic, Docetaxel Eligible Histology: Adenocarcinoma Therapeutic Status: Distant Metastases  Intent of Therapy: Non-Curative / Palliative Intent, Discussed with Patient 

## 2019-08-31 NOTE — Progress Notes (Signed)
Hematology and Oncology Follow Up Visit  Gregory Schneider [id number] 20-Nov-1955 63 y.o. 08/31/2019 1:25 PM   Principle Diagnosis: 63 year old man with advanced prostate cancer with bone involvement diagnosed in 2013.  He has castration-resistant after initial diagnosis of localized disease in 2010 with Gleason score 4 + 5 = 9.  Prior Therapy:  Combined androgen deprivation with Lupron and Casodex. He had a good response and then developed a rise in the PSA with castrate level testosterone.  He is S/P Bilateral simple orchiectomy done on 12/25/2012.  Provenge started on 03/03/12. Therapy Ended in 03/31/2012.  Zytiga started in 10/2013. Therapy discontinued in January 2018 because of progression of disease. Xofigo infusion to start on Apr 13, 2018.  He completed 6 months of therapy in November 2019. Status post prostate radiation between December 19, 2018 and January 01, 2019.  He received total of 30 Gy in 10 fractions to the prostate and the pubic rami.  Current therapy:   Xtandi 160 mg daily started in February 2018.    Interim History:  Gregory Schneider is here for return evaluation.  Since the last visit, he reports no major changes in his health.  He continues to have mild pain and discomfort in his lower extremities associated with occasional neuropathy in the top of his both feet.  He denies any neuropathy in his upper extremities.  He denies any constipation or diarrhea.  He denies any difficulty urinating or defecating.  His mobility is maintained at this time.  He denies any recent hospitalizations or illnesses.  He denied headaches, blurry vision, syncope or seizures.  Denies any fevers, chills or sweats.  Denied chest pain, palpitation, orthopnea or leg edema.  Denied cough, wheezing or hemoptysis.  Denied nausea, vomiting or abdominal pain.  Denies any constipation or diarrhea.  Denies any frequency urgency or hesitancy.  Denies any arthralgias or myalgias.  Denies any skin rashes or lesions.   Denies any bleeding or clotting tendency.  Denies any easy bruising.  Denies any hair or nail changes.  Denies any anxiety or depression.  Remaining review of system is negative.            Medications: Updated without any changes. Current Outpatient Medications  Medication Sig Dispense Refill  . buPROPion (ZYBAN) 150 MG 12 hr tablet Take 150 mg by mouth.    . calcium carbonate (OS-CAL) 600 MG TABS Take 600 mg by mouth daily.    . cholecalciferol (VITAMIN D) 1000 UNITS tablet Take 1,000 Units by mouth daily.    . clonazePAM (KLONOPIN) 1 MG tablet Take 1 mg by mouth as needed for anxiety (1-2 tablets daily as needed).     . Cyanocobalamin (RA VITAMIN B-12 TR) 1000 MCG TBCR Take by mouth.    . gabapentin (NEURONTIN) 300 MG capsule TAKE ONE CAPSULE AT BEDTIME 90 capsule 3  . HYDROcodone-acetaminophen (NORCO/VICODIN) 5-325 MG tablet Take 1-2 tablets by mouth every 6 (six) hours as needed. 90 tablet 0  . losartan-hydrochlorothiazide (HYZAAR) 100-25 MG tablet   0  . Multiple Vitamin (MULTIVITAMIN) tablet Take 1 tablet by mouth daily.    . prochlorperazine (COMPAZINE) 10 MG tablet Take 1 tablet (10 mg total) by mouth every 6 (six) hours as needed for nausea or vomiting. (Patient not taking: Reported on 12/12/2018) 30 tablet 3  . sertraline (ZOLOFT) 100 MG tablet Take 200 mg by mouth every morning.     Gillermina Phy 40 MG capsule TAKE 4 CAPSULES (160 MG TOTAL) BY MOUTH DAILY. [place]  capsule 0   No current facility-administered medications for this visit.     Allergies:  Allergies  Allergen Reactions  . Penicillins Other (See Comments)    Patient stated,"I don't remember what kind of reaction because I was a kid."    His past medical history, social history and family history reviewed without changes.  Physical Exam:     Blood pressure 134/77, pulse 72, temperature 97.8 F (36.6 C), temperature source Temporal, resp. rate 18, height 6\' 3"  (1.905 m), weight (!) 347 lb 9.6 oz (157.7 kg),  SpO2 98 %.     ECOG: 1    General appearance: Comfortable appearing without any discomfort Head: Normocephalic without any trauma Oropharynx: Mucous membranes are moist and pink without any thrush or ulcers. Eyes: Pupils are equal and round reactive to light. Lymph nodes: No cervical, supraclavicular, inguinal or axillary lymphadenopathy.   Heart:regular rate and rhythm.  S1 and S2 without leg edema. Lung: Clear without any rhonchi or wheezes.  No dullness to percussion. Abdomin: Soft, nontender, nondistended with good bowel sounds.  No hepatosplenomegaly. Musculoskeletal: No joint deformity or effusion.  Full range of motion noted. Neurological: No deficits noted on motor, sensory and deep tendon reflex exam. Skin: No petechial rash or dryness.  Appeared moist.            Lab Results: Lab Results  Component Value Date   WBC 5.4 08/28/2019   HGB 12.5 (L) 08/28/2019   HCT 36.6 (L) 08/28/2019   MCV 92.2 08/28/2019   PLT 121 (L) 08/28/2019     Chemistry      Component Value Date/Time   NA 141 08/28/2019 0913   NA 136 11/17/2017 0956   K 3.9 08/28/2019 0913   K 3.8 11/17/2017 0956   CL 102 08/28/2019 0913   CL 104 03/09/2013 0921   CO2 28 08/28/2019 0913   CO2 28 11/17/2017 0956   BUN 18 08/28/2019 0913   BUN 19.7 11/17/2017 0956   CREATININE 0.99 08/28/2019 0913   CREATININE 0.9 11/17/2017 0956      Component Value Date/Time   CALCIUM 9.0 08/28/2019 0913   CALCIUM 9.5 11/17/2017 0956   ALKPHOS 107 08/28/2019 0913   ALKPHOS 89 11/17/2017 0956   AST 17 08/28/2019 0913   AST 21 11/17/2017 0956   ALT 28 08/28/2019 0913   ALT 47 11/17/2017 0956   BILITOT 0.4 08/28/2019 0913   BILITOT 0.39 11/17/2017 0956        Results for Gregory, Schneider (MRN [id number]) as of 08/31/2019 12:42  Ref. Range 06/27/2019 09:04 08/28/2019 09:13  Prostate Specific Ag, Serum Latest Ref Range: 0.0 - 4.0 ng/mL 45.8 (H) 89.8 (H)      Impression and Plan:    63 year old man  with  1.  Prostate cancer diagnosed in 2010.  He developed subsequently advanced disease that is currently castration-resistant.   He remains on Xtandi although developing disease progression rather rapidly at this point.  His PSA is doubling in less than 2 months currently at 89.8.  CT scan and bone scan obtained on 08/28/2019 were personally reviewed showed slight progression in the bone disease as well as questionable hepatic lesions that could represents disease metastasis to the liver.  Options of therapy were reviewed today at this time which include systemic chemotherapy versus a PARP inhibitor if he harbors the appropriate mutation.  Risks and benefits of both approaches as well as complications associated with chemotherapy was reiterated.  His complications include nausea, vomiting, myelosuppression, neutropenia  and neutropenic sepsis.  After discussion today he is agreeable to proceed with chemotherapy which she will receive in the near future.  Taxotere 75 mg per metered square every 3 weeks for close to 10 cycles.   2. Androgen deprivation: Continues to have castrate level testosterone without any additional androgen deprivation needed.  He status post orchiectomy.  3.  Bone directed therapy: I recommended calcium and vitamin D supplements.  4. Hypertension: No issues with elevated blood pressure noted at this time.  Discontinuation of extending should help moving forward.  5.  Prognosis and goals of care: Therapy remains palliative at this time although aggressive measures are warranted given his excellent performance status.  6.  Bone pain: He is on hydrocodone and Neurontin.  I have refilled his medication at this time.  I instructed him to take Neurontin at 2 tablets at bedtime, 2 tablets in the morning and 1 in the afternoon.  7.  IV access: Risks and benefits of Port-A-Cath insertion was discussed today.  Complications that include bleeding, thrombosis and infection were  reiterated.  He is agreeable to proceed.  8.  Antiemetics: Prescription for Compazine will be made available to him.  9.  Growth factor support: He will require injection every cycle to prevent neutropenia and neutropenic sepsis.  10. Follow up: In the next 2 weeks to start chemotherapy.   25 minutes was spent with the patient face-to-face today.  More than 50% of time was dedicated to reviewing his disease status, reviewing imaging studies, treatment options and complications related to therapy.     Zola Button MD 10/16/20201:25 PM

## 2019-08-31 NOTE — Telephone Encounter (Signed)
Confirmed with patient appointments for October/November.

## 2019-09-03 ENCOUNTER — Telehealth: Payer: Self-pay

## 2019-09-03 NOTE — Telephone Encounter (Signed)
Patient has port placement scheduled for same state and time as chemo education is scheduled on 09/10/19. Scheduling message sent to change chemo education class. Patient called and informed that he will be receiving a phone call from scheduling to reschedule chemo education appointment. Patient verbalized understanding.

## 2019-09-04 ENCOUNTER — Telehealth: Payer: Self-pay | Admitting: Oncology

## 2019-09-04 NOTE — Telephone Encounter (Signed)
R/s appt per 10/19 sch message- pt is aware of appt date and time

## 2019-09-07 ENCOUNTER — Other Ambulatory Visit: Payer: Self-pay | Admitting: Oncology

## 2019-09-07 ENCOUNTER — Telehealth: Payer: Self-pay

## 2019-09-07 NOTE — Telephone Encounter (Signed)
Called patient back and made him aware to stop taking the [place] today. Patient verbalized understanding and had no other questions or concerns.

## 2019-09-07 NOTE — Telephone Encounter (Signed)
-----   Message from Wyatt Portela, MD sent at 09/07/2019  1:10 PM EDT ----- Please till him to stop now. Thanks ----- Message ----- From: Scot Dock, RN Sent: 09/07/2019   1:02 PM EDT To: Wyatt Portela, MD  He starts Taxotere on 10/29 and would like to know when he should stop taking the [place]. Please advise

## 2019-09-10 ENCOUNTER — Ambulatory Visit (HOSPITAL_COMMUNITY)
Admission: RE | Admit: 2019-09-10 | Discharge: 2019-09-10 | Disposition: A | Discharge status: Home / Self Care | Payer: Medicare HMO | Source: Ambulatory Visit | Attending: Oncology | Admitting: Oncology

## 2019-09-10 ENCOUNTER — Other Ambulatory Visit: Payer: Self-pay

## 2019-09-10 ENCOUNTER — Other Ambulatory Visit: Payer: Medicare HMO

## 2019-09-10 ENCOUNTER — Encounter (HOSPITAL_COMMUNITY): Payer: Self-pay

## 2019-09-10 ENCOUNTER — Other Ambulatory Visit: Payer: Self-pay | Admitting: Oncology

## 2019-09-10 ENCOUNTER — Other Ambulatory Visit: Payer: Self-pay | Admitting: Student

## 2019-09-10 DIAGNOSIS — Z452 Encounter for adjustment and management of vascular access device: Secondary | ICD-10-CM | POA: Diagnosis not present

## 2019-09-10 DIAGNOSIS — Z880 Allergy status to penicillin: Secondary | ICD-10-CM | POA: Diagnosis not present

## 2019-09-10 DIAGNOSIS — C61 Malignant neoplasm of prostate: Secondary | ICD-10-CM | POA: Diagnosis not present

## 2019-09-10 DIAGNOSIS — M1990 Unspecified osteoarthritis, unspecified site: Secondary | ICD-10-CM | POA: Insufficient documentation

## 2019-09-10 DIAGNOSIS — Z9079 Acquired absence of other genital organ(s): Secondary | ICD-10-CM | POA: Insufficient documentation

## 2019-09-10 DIAGNOSIS — I10 Essential (primary) hypertension: Secondary | ICD-10-CM | POA: Diagnosis not present

## 2019-09-10 DIAGNOSIS — Z79899 Other long term (current) drug therapy: Secondary | ICD-10-CM | POA: Diagnosis not present

## 2019-09-10 DIAGNOSIS — F329 Major depressive disorder, single episode, unspecified: Secondary | ICD-10-CM | POA: Insufficient documentation

## 2019-09-10 DIAGNOSIS — R69 Illness, unspecified: Secondary | ICD-10-CM | POA: Diagnosis not present

## 2019-09-10 DIAGNOSIS — N400 Enlarged prostate without lower urinary tract symptoms: Secondary | ICD-10-CM | POA: Insufficient documentation

## 2019-09-10 HISTORY — PX: IR IMAGING GUIDED PORT INSERTION: IMG5740

## 2019-09-10 LAB — CBC
HCT: 37.9 % — ABNORMAL LOW (ref 39.0–52.0)
Hemoglobin: 12.6 g/dL — ABNORMAL LOW (ref 13.0–17.0)
MCH: 31.5 pg (ref 26.0–34.0)
MCHC: 33.2 g/dL (ref 30.0–36.0)
MCV: 94.8 fL (ref 80.0–100.0)
Platelets: 112 10*3/uL — ABNORMAL LOW (ref 150–400)
RBC: 4 MIL/uL — ABNORMAL LOW (ref 4.22–5.81)
RDW: 14.3 % (ref 11.5–15.5)
WBC: 5.4 10*3/uL (ref 4.0–10.5)
nRBC: 0 % (ref 0.0–0.2)

## 2019-09-10 LAB — PROTIME-INR
INR: 0.9 (ref 0.8–1.2)
Prothrombin Time: 12 seconds (ref 11.4–15.2)

## 2019-09-10 LAB — APTT: aPTT: 26 seconds (ref 24–36)

## 2019-09-10 MED ORDER — MIDAZOLAM HCL 2 MG/2ML IJ SOLN
INTRAMUSCULAR | Status: AC
  Filled 2019-09-10: qty 4

## 2019-09-10 MED ORDER — VANCOMYCIN HCL IN DEXTROSE 1-5 GM/200ML-% IV SOLN
INTRAVENOUS | Status: AC
  Administered 2019-09-10: 1000 mg via INTRAVENOUS
  Filled 2019-09-10: qty 200

## 2019-09-10 MED ORDER — MIDAZOLAM HCL 2 MG/2ML IJ SOLN
INTRAMUSCULAR | Status: AC | PRN
  Administered 2019-09-10 (×2): 1 mg via INTRAVENOUS

## 2019-09-10 MED ORDER — FENTANYL CITRATE (PF) 100 MCG/2ML IJ SOLN
INTRAMUSCULAR | Status: AC | PRN
  Administered 2019-09-10 (×2): 50 ug via INTRAVENOUS

## 2019-09-10 MED ORDER — HEPARIN SOD (PORK) LOCK FLUSH 100 UNIT/ML IV SOLN
INTRAVENOUS | Status: AC
  Filled 2019-09-10: qty 5

## 2019-09-10 MED ORDER — LIDOCAINE HCL (PF) 1 % IJ SOLN
INTRAMUSCULAR | Status: AC | PRN
  Administered 2019-09-10 (×2): 10 mL

## 2019-09-10 MED ORDER — FENTANYL CITRATE (PF) 100 MCG/2ML IJ SOLN
INTRAMUSCULAR | Status: AC
  Filled 2019-09-10: qty 2

## 2019-09-10 MED ORDER — SODIUM CHLORIDE 0.9 % IV SOLN
INTRAVENOUS | Status: DC
  Administered 2019-09-10: 13:00:00 via INTRAVENOUS

## 2019-09-10 MED ORDER — VANCOMYCIN HCL IN DEXTROSE 1-5 GM/200ML-% IV SOLN
1000.0000 mg | Freq: Once | INTRAVENOUS | Status: AC
  Administered 2019-09-10: 14:00:00 1000 mg via INTRAVENOUS

## 2019-09-10 MED ORDER — LIDOCAINE HCL 1 % IJ SOLN
INTRAMUSCULAR | Status: AC
  Filled 2019-09-10: qty 20

## 2019-09-10 MED ORDER — HEPARIN SOD (PORK) LOCK FLUSH 100 UNIT/ML IV SOLN
INTRAVENOUS | Status: AC | PRN
  Administered 2019-09-10: 500 [IU] via INTRAVENOUS

## 2019-09-10 NOTE — H&P (Signed)
Referring Physician(s): Wyatt Portela  Supervising Physician: Corrie Mckusick  Patient Status:  [facility] OP  Chief Complaint: "I'm getting a port a cath"   Subjective: Patient familiar to IR service from pheresis catheter placement in 2013.  He has a history of advanced castration resistant prostate cancer, initially diagnosed in 2010.  He now has disease progression and presents again today for Port-A-Cath placement for palliative chemotherapy.  He currently denies fever, headache, chest pain, dyspnea, cough, abdominal pain, nausea, vomiting or bleeding.  He does have chronic back pain.   Past Medical History:  Diagnosis Date  . Arthritis   . BPH (benign prostatic hyperplasia)   . Cancer ([place])   . Depression   . Hypertension   . Pneumonia    hx of  . Prostate cancer ([place]) 03/03/2012   Past Surgical History:  Procedure Laterality Date  . HYDROCELE EXCISION Left 12/25/2012   Procedure: HYDROCELECTOMY ADULT;  Surgeon: Dutch Gray, MD;  Location: [facility]S;  Service: Urology;  Laterality: Left;  LEFT HYDROCELE REPAIR, BILATERAL SIMPLE ORCHIECTOMY   . ORCHIECTOMY Bilateral 12/25/2012   Procedure: ORCHIECTOMY;  Surgeon: Dutch Gray, MD;  Location: [facility];  Service: Urology;  Laterality: Bilateral;       Allergies: Penicillins  Medications: Prior to Admission medications   Medication Sig Start Date End Date Taking? Authorizing Provider  amLODipine (NORVASC) 2.5 MG tablet Take 2.5 mg by mouth daily.   Yes [provider]  buPROPion (ZYBAN) 150 MG 12 hr tablet Take 150 mg by mouth.   Yes [provider]  calcium carbonate (OS-CAL) 600 MG TABS Take 600 mg by mouth daily.   Yes [provider]  cholecalciferol (VITAMIN D) 1000 UNITS tablet Take 1,000 Units by mouth daily.   Yes [provider]  clonazePAM (KLONOPIN) 1 MG tablet Take 1 mg by mouth as needed for anxiety (1-2 tablets daily as needed).    Yes [provider]  Cyanocobalamin (RA  VITAMIN B-12 TR) 1000 MCG TBCR Take by mouth.   Yes [provider]  gabapentin (NEURONTIN) 300 MG capsule Take 1 capsule (300 mg total) by mouth at bedtime. 08/31/19  Yes Wyatt Portela, MD  HYDROcodone-acetaminophen (NORCO/VICODIN) 5-325 MG tablet Take 1-2 tablets by mouth every 6 (six) hours as needed. 08/31/19  Yes Wyatt Portela, MD  losartan-hydrochlorothiazide [facility]) 100-25 MG tablet  11/16/16  Yes [provider]  Multiple Vitamin (MULTIVITAMIN) tablet Take 1 tablet by mouth daily.   Yes [provider]  sertraline (ZOLOFT) 100 MG tablet Take 200 mg by mouth every morning.    Yes [provider]  XTANDI 40 MG capsule TAKE 4 CAPSULES (160 MG TOTAL) BY MOUTH DAILY. 08/17/19  Yes Wyatt Portela, MD  lidocaine-prilocaine (EMLA) cream Apply 1 application topically as needed. 08/31/19   Wyatt Portela, MD  prochlorperazine (COMPAZINE) 10 MG tablet Take 1 tablet (10 mg total) by mouth every 6 (six) hours as needed for nausea or vomiting. 08/31/19   Wyatt Portela, MD     Vital Signs: BP (!) 151/96 (BP Location: Left Arm)   Pulse 67   Temp 97.7 F (36.5 C) (Oral)   Resp 18   SpO2 100%   Physical Exam awake, alert.  Chest clear to auscultation bilaterally.  Heart with regular rate and rhythm.  Abdomen obese, soft, positive bowel sounds, nontender.  No significant lower extremity edema.  Imaging: No results found.  Labs:  CBC: Recent Labs    01/18/19  [id number] 04/18/19 1018 06/27/19 0904 08/28/19 0913  WBC 4.4 4.4 6.4 5.4  HGB 13.1 11.5* 13.2 12.5*  HCT 37.3* 34.1* 38.6* 36.6*  PLT 116* 113* 139* 121*    COAGS: No results for input(s): INR, APTT in the last 8760 hours.  BMP: Recent Labs    01/18/19 0921 04/18/19 1018 06/27/19 0904 08/28/19 0913  NA 137 139 137 141  K 3.4* 3.4* 4.3 3.9  CL 98 102 102 102  CO2 30 27 22 28   GLUCOSE 152* 142* 119* 136*  BUN 20 14 16 18   CALCIUM 9.6 8.9 9.5 9.0  CREATININE 0.96 0.87 0.97 0.99   GFRNONAA >60 >60 >60 >60  GFRAA >60 >60 >60 >60    LIVER FUNCTION TESTS: Recent Labs    01/18/19 0921 04/18/19 1018 06/27/19 0904 08/28/19 0913  BILITOT 0.7 0.5 0.5 0.4  AST 27 17 19 17   ALT 46* 27 35 28  ALKPHOS 74 82 99 107  PROT 7.6 6.8 7.4 7.1  ALBUMIN 4.2 3.7 4.3 3.9    Assessment and Plan: Pt with history of advanced castration resistant prostate cancer, initially diagnosed in 2010.  He now has disease progression and presents  today for Port-A-Cath placement for palliative chemotherapy. Risks and benefits of image guided port-a-catheter placement was discussed with the patient including, but not limited to bleeding, infection, pneumothorax, or fibrin sheath development and need for additional procedures.  All of the patient's questions were answered, patient is agreeable to proceed. Consent signed and in chart.  LABS PENDING   Electronically Signed: D. Rowe Robert, PA-C 09/10/2019, 12:40 PM   I spent a total of 25 minutes at the the patient's bedside AND on the patient's hospital floor or unit, greater than 50% of which was counseling/coordinating care for Port-A-Cath placement

## 2019-09-10 NOTE — Discharge Instructions (Signed)
Do not use EMLA cream over your new port until the cancer center nurses inform you that your port has healed. The petroleum in the EMLA cream will dissolve the skin glue under your dressing. Use ice in a zip lock bag for 3-4 minutes over your port prior to the nurses accessing your port.  Implanted Port Insertion, Care After This sheet gives you information about how to care for yourself after your procedure. Your health care provider may also give you more specific instructions. If you have problems or questions, contact your health care provider. What can I expect after the procedure? After the procedure, it is common to have:  Discomfort at the port insertion site.  Bruising on the skin over the port. This should improve over 3-4 days. Follow these instructions at home: [facility] care  After your port is placed, you will get a manufacturer's information card. The card has information about your port. Keep this card with you at all times.  Take care of the port as told by your health care provider. Ask your health care provider if you or a family member can get training for taking care of the port at home. A home health care nurse may also take care of the port.  Make sure to remember what type of port you have. Incision care      Follow instructions from your health care provider about how to take care of your port insertion site. Make sure you: ? Wash your hands with soap and water before and after you change your bandage (dressing). If soap and water are not available, use hand sanitizer. ? Change your dressing as told by your health care provider. ? Leave stitches (sutures), skin glue, or adhesive strips in place. These skin closures may need to stay in place for 2 weeks or longer. If adhesive strip edges start to loosen and curl up, you may trim the loose edges. Do not remove adhesive strips completely unless your health care provider tells you to do that.  Check your port insertion  site every day for signs of infection. Check for: ? Redness, swelling, or pain. ? Fluid or blood. ? Warmth. ? Pus or a bad smell. Activity  Return to your normal activities as told by your health care provider. Ask your health care provider what activities are safe for you.  Do not lift anything that is heavier than 10 lb (4.5 kg), or the limit that you are told, until your health care provider says that it is safe. General instructions  Take over-the-counter and prescription medicines only as told by your health care provider.  Do not take baths, swim, or use a hot tub until your health care provider approves. Ask your health care provider if you may take showers. You may only be allowed to take sponge baths.  Do not drive for 24 hours if you were given a sedative during your procedure.  Wear a medical alert bracelet in case of an emergency. This will tell any health care providers that you have a port.  Keep all follow-up visits as told by your health care provider. This is important. Contact a health care provider if:  You cannot flush your port with saline as directed, or you cannot draw blood from the port.  You have a fever or chills.  You have redness, swelling, or pain around your port insertion site.  You have fluid or blood coming from your port insertion site.  Your port insertion site  feels warm to the touch.  You have pus or a bad smell coming from the port insertion site. Get help right away if:  You have chest pain or shortness of breath.  You have bleeding from your port that you cannot control. Summary  Take care of the port as told by your health care provider. Keep the manufacturer's information card with you at all times.  Change your dressing as told by your health care provider.  Contact a health care provider if you have a fever or chills or if you have redness, swelling, or pain around your port insertion site.  Keep all follow-up visits as told  by your health care provider. This information is not intended to replace advice given to you by your health care provider. Make sure you discuss any questions you have with your health care provider. Document Released: 08/22/2013 Document Revised: 05/30/2018 Document Reviewed: 05/30/2018 Elsevier Patient Education  [place].        Moderate Conscious Sedation, Adult, Care After These instructions provide you with information about caring for yourself after your procedure. Your health care provider may also give you more specific instructions. Your treatment has been planned according to current medical practices, but problems sometimes occur. Call your health care provider if you have any problems or questions after your procedure. What can I expect after the procedure? After your procedure, it is common:  To feel sleepy for several hours.  To feel clumsy and have poor balance for several hours.  To have poor judgment for several hours.  To vomit if you eat too soon. Follow these instructions at home: For at least 24 hours after the procedure:   Do not: ? Participate in activities where you could fall or become injured. ? Drive. ? Use heavy machinery. ? Drink alcohol. ? Take sleeping pills or medicines that cause drowsiness. ? Make important decisions or sign legal documents. ? Take care of children on your own.  Rest. Eating and drinking  Follow the diet recommended by your health care provider.  If you vomit: ? Drink water, juice, or soup when you can drink without vomiting. ? Make sure you have little or no nausea before eating solid foods. General instructions  Have a responsible adult stay with you until you are awake and alert.  Take over-the-counter and prescription medicines only as told by your health care provider.  If you smoke, do not smoke without supervision.  Keep all follow-up visits as told by your health care provider. This is  important. Contact a health care provider if:  You keep feeling nauseous or you keep vomiting.  You feel light-headed.  You develop a rash.  You have a fever. Get help right away if:  You have trouble breathing. This information is not intended to replace advice given to you by your health care provider. Make sure you discuss any questions you have with your health care provider. Document Released: 08/22/2013 Document Revised: 10/14/2017 Document Reviewed: 02/21/2016 Elsevier Patient Education  2020 [employer].

## 2019-09-10 NOTE — Procedures (Signed)
Interventional Radiology Procedure Note  Procedure: Placement of a right EJ approach single lumen PowerPort.  Tip is positioned at the superior cavoatrial junction and catheter is ready for immediate use.  Complications: None Recommendations:  - Ok to shower tomorrow - Do not submerge for 7 days - Routine line care   Signed,  Dulcy Fanny. Earleen Newport, DO

## 2019-09-12 ENCOUNTER — Encounter: Payer: Self-pay | Admitting: Oncology

## 2019-09-12 ENCOUNTER — Other Ambulatory Visit: Payer: Self-pay

## 2019-09-12 ENCOUNTER — Inpatient Hospital Stay: Payer: Medicare HMO

## 2019-09-12 NOTE — Progress Notes (Signed)
Met with patient to introduce myself as Arboriculturist and to offer available resources.  Discussed one-time $700 [place] and one-time $200 Prostate grant and qualifications to assist with personal expenses while going through treatment.  Gave him my card if interested in applying and for any additional financial questions or concerns.

## 2019-09-13 ENCOUNTER — Other Ambulatory Visit: Payer: Self-pay

## 2019-09-13 ENCOUNTER — Inpatient Hospital Stay: Payer: Medicare HMO

## 2019-09-13 VITALS — BP 124/68 | HR 75 | Temp 98.3°F | Resp 16 | Ht 75.0 in | Wt 337.5 lb

## 2019-09-13 DIAGNOSIS — C7951 Secondary malignant neoplasm of bone: Secondary | ICD-10-CM

## 2019-09-13 DIAGNOSIS — R9721 Rising PSA following treatment for malignant neoplasm of prostate: Secondary | ICD-10-CM | POA: Diagnosis not present

## 2019-09-13 DIAGNOSIS — C61 Malignant neoplasm of prostate: Secondary | ICD-10-CM

## 2019-09-13 DIAGNOSIS — Z79899 Other long term (current) drug therapy: Secondary | ICD-10-CM | POA: Diagnosis not present

## 2019-09-13 DIAGNOSIS — Z9079 Acquired absence of other genital organ(s): Secondary | ICD-10-CM | POA: Diagnosis not present

## 2019-09-13 DIAGNOSIS — Z95828 Presence of other vascular implants and grafts: Secondary | ICD-10-CM

## 2019-09-13 LAB — CBC WITH DIFFERENTIAL (CANCER CENTER ONLY)
Abs Immature Granulocytes: 0.03 10*3/uL (ref 0.00–0.07)
Basophils Absolute: 0 10*3/uL (ref 0.0–0.1)
Basophils Relative: 0 %
Eosinophils Absolute: 0.1 10*3/uL (ref 0.0–0.5)
Eosinophils Relative: 1 %
HCT: 36.6 % — ABNORMAL LOW (ref 39.0–52.0)
Hemoglobin: 12.5 g/dL — ABNORMAL LOW (ref 13.0–17.0)
Immature Granulocytes: 1 %
Lymphocytes Relative: 19 %
Lymphs Abs: 1 10*3/uL (ref 0.7–4.0)
MCH: 31.8 pg (ref 26.0–34.0)
MCHC: 34.2 g/dL (ref 30.0–36.0)
MCV: 93.1 fL (ref 80.0–100.0)
Monocytes Absolute: 0.5 10*3/uL (ref 0.1–1.0)
Monocytes Relative: 8 %
Neutro Abs: 3.9 10*3/uL (ref 1.7–7.7)
Neutrophils Relative %: 71 %
Platelet Count: 116 10*3/uL — ABNORMAL LOW (ref 150–400)
RBC: 3.93 MIL/uL — ABNORMAL LOW (ref 4.22–5.81)
RDW: 14.2 % (ref 11.5–15.5)
WBC Count: 5.6 10*3/uL (ref 4.0–10.5)
nRBC: 0 % (ref 0.0–0.2)

## 2019-09-13 LAB — CMP (CANCER CENTER ONLY)
ALT: 26 U/L (ref 0–44)
AST: 16 U/L (ref 15–41)
Albumin: 3.9 g/dL (ref 3.5–5.0)
Alkaline Phosphatase: 108 U/L (ref 38–126)
Anion gap: 10 (ref 5–15)
BUN: 18 mg/dL (ref 8–23)
CO2: 28 mmol/L (ref 22–32)
Calcium: 9.4 mg/dL (ref 8.9–10.3)
Chloride: 99 mmol/L (ref 98–111)
Creatinine: 1.03 mg/dL (ref 0.61–1.24)
GFR, Est AFR Am: >60 mL/min (ref >60)
GFR, Estimated: >60 mL/min (ref >60)
Glucose, Bld: 101 mg/dL — ABNORMAL HIGH (ref 70–99)
Potassium: 4.3 mmol/L (ref 3.5–5.1)
Sodium: 137 mmol/L (ref 135–145)
Total Bilirubin: 0.5 mg/dL (ref 0.3–1.2)
Total Protein: 7.3 g/dL (ref 6.5–8.1)

## 2019-09-13 MED ORDER — DEXAMETHASONE SODIUM PHOSPHATE 10 MG/ML IJ SOLN
10.0000 mg | Freq: Once | INTRAMUSCULAR | Status: AC
  Administered 2019-09-13: 10 mg via INTRAVENOUS

## 2019-09-13 MED ORDER — DEXAMETHASONE SODIUM PHOSPHATE 10 MG/ML IJ SOLN
INTRAMUSCULAR | Status: AC
  Filled 2019-09-13: qty 1

## 2019-09-13 MED ORDER — HEPARIN SOD (PORK) LOCK FLUSH 100 UNIT/ML IV SOLN
500.0000 [IU] | Freq: Once | INTRAVENOUS | Status: AC | PRN
  Administered 2019-09-13: 500 [IU]
  Filled 2019-09-13: qty 5

## 2019-09-13 MED ORDER — SODIUM CHLORIDE 0.9 % IV SOLN
Freq: Once | INTRAVENOUS | Status: AC
  Administered 2019-09-13: 14:00:00 via INTRAVENOUS
  Filled 2019-09-13: qty 250

## 2019-09-13 MED ORDER — SODIUM CHLORIDE 0.9% FLUSH
10.0000 mL | INTRAVENOUS | Status: DC | PRN
  Administered 2019-09-13: 12:00:00 10 mL via INTRAVENOUS
  Filled 2019-09-13: qty 10

## 2019-09-13 MED ORDER — SODIUM CHLORIDE 0.9 % IV SOLN
75.0000 mg/m2 | Freq: Once | INTRAVENOUS | Status: AC
  Administered 2019-09-13: 220 mg via INTRAVENOUS
  Filled 2019-09-13: qty 22

## 2019-09-13 MED ORDER — SODIUM CHLORIDE 0.9% FLUSH
10.0000 mL | INTRAVENOUS | Status: DC | PRN
  Administered 2019-09-13: 10 mL
  Filled 2019-09-13: qty 10

## 2019-09-13 NOTE — Patient Instructions (Addendum)
[facility] Discharge Instructions for Patients Receiving Chemotherapy  Today you received the following chemotherapy agents Taxotere To help prevent nausea and vomiting after your treatment, we encourage you to take your nausea medication as prescribed.   If you develop nausea and vomiting that is not controlled by your nausea medication, call the clinic.   BELOW ARE SYMPTOMS THAT SHOULD BE REPORTED IMMEDIATELY:  *FEVER GREATER THAN 100.5 F  *CHILLS WITH OR WITHOUT FEVER  NAUSEA AND VOMITING THAT IS NOT CONTROLLED WITH YOUR NAUSEA MEDICATION  *UNUSUAL SHORTNESS OF BREATH  *UNUSUAL BRUISING OR BLEEDING  TENDERNESS IN MOUTH AND THROAT WITH OR WITHOUT PRESENCE OF ULCERS  *URINARY PROBLEMS  *BOWEL PROBLEMS  UNUSUAL RASH Items with * indicate a potential emergency and should be followed up as soon as possible.  Feel free to call the clinic should you have any questions or concerns. The clinic phone number is [phone].  Please show the CHEMO ALERT CARD at check-in to the Emergency Department and triage nurse.   

## 2019-09-13 NOTE — Patient Instructions (Signed)

## 2019-09-14 ENCOUNTER — Telehealth: Payer: Self-pay | Admitting: Oncology

## 2019-09-14 LAB — PROSTATE-SPECIFIC AG, SERUM (LABCORP): Prostate Specific Ag, Serum: 123 ng/mL — ABNORMAL HIGH (ref 0.0–4.0)

## 2019-09-14 NOTE — Telephone Encounter (Signed)
R/s appt per 10/30 sch message - pt aware of appt date and time

## 2019-09-15 ENCOUNTER — Inpatient Hospital Stay: Payer: Medicare HMO

## 2019-09-15 VITALS — BP 135/85 | HR 91 | Temp 98.2°F | Resp 16

## 2019-09-15 DIAGNOSIS — C7951 Secondary malignant neoplasm of bone: Secondary | ICD-10-CM | POA: Diagnosis not present

## 2019-09-15 DIAGNOSIS — R9721 Rising PSA following treatment for malignant neoplasm of prostate: Secondary | ICD-10-CM | POA: Diagnosis not present

## 2019-09-15 DIAGNOSIS — C61 Malignant neoplasm of prostate: Secondary | ICD-10-CM | POA: Diagnosis not present

## 2019-09-15 DIAGNOSIS — Z79899 Other long term (current) drug therapy: Secondary | ICD-10-CM | POA: Diagnosis not present

## 2019-09-15 DIAGNOSIS — Z9079 Acquired absence of other genital organ(s): Secondary | ICD-10-CM | POA: Diagnosis not present

## 2019-09-15 MED ORDER — PEGFILGRASTIM-CBQV 6 MG/0.6ML ~~LOC~~ SOSY
6.0000 mg | PREFILLED_SYRINGE | Freq: Once | SUBCUTANEOUS | Status: AC
  Administered 2019-09-15: 6 mg via SUBCUTANEOUS

## 2019-09-15 NOTE — Patient Instructions (Signed)

## 2019-09-21 ENCOUNTER — Telehealth: Payer: Self-pay

## 2019-09-21 NOTE — Telephone Encounter (Signed)
-----   Message from Wyatt Portela, MD sent at 09/21/2019 10:27 AM EST ----- Regarding: RE: Patient advice request Ok to get the vaccine. He is monitor the jaw swelling. He can apply ice to decrease the swelling. Report next week if not better. Thanks ----- Message ----- From: Teodoro Spray, RN Sent: 09/21/2019  10:16 AM EST To: Wyatt Portela, MD Subject: Patient advice request                         Patient called stating he is experiencing L-sided jaw swelling.  Patient had first round of taxotere on 09/13/19 without any difficulties. 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 Patient states the swelling is similar to what he experienced with zometa. Patient denies difficulty swallowing or breathing, but does note some discomfort when opening his mouth.   Patient also is due for his second round of shingles vaccinations.  He would like to know if he is able to go ahead with the vaccination or if he should hold off while he is on treatment.   Please advise.  Thank you.

## 2019-09-21 NOTE — Telephone Encounter (Signed)
Patient called and informed of information below.  Patient verbalized understanding and will call office next week if the swelling has not improved or becomes worse.  Patient instructed to seek medical attention if he starts to have difficulties swallowing or breathing. Patient instructed to call office if he has any questions or concerns.

## 2019-10-02 ENCOUNTER — Inpatient Hospital Stay: Payer: Medicare HMO

## 2019-10-02 ENCOUNTER — Inpatient Hospital Stay: Payer: Medicare HMO | Attending: Oncology

## 2019-10-02 ENCOUNTER — Telehealth: Payer: Self-pay | Admitting: Oncology

## 2019-10-02 ENCOUNTER — Other Ambulatory Visit: Payer: Self-pay

## 2019-10-02 ENCOUNTER — Inpatient Hospital Stay (HOSPITAL_BASED_OUTPATIENT_CLINIC_OR_DEPARTMENT_OTHER): Payer: Medicare HMO | Admitting: Oncology

## 2019-10-02 VITALS — BP 131/71 | HR 68 | Temp 98.2°F | Resp 17 | Ht 75.0 in | Wt 342.5 lb

## 2019-10-02 DIAGNOSIS — C61 Malignant neoplasm of prostate: Secondary | ICD-10-CM

## 2019-10-02 DIAGNOSIS — Z5111 Encounter for antineoplastic chemotherapy: Secondary | ICD-10-CM | POA: Diagnosis present

## 2019-10-02 DIAGNOSIS — Z9079 Acquired absence of other genital organ(s): Secondary | ICD-10-CM | POA: Insufficient documentation

## 2019-10-02 DIAGNOSIS — C7951 Secondary malignant neoplasm of bone: Secondary | ICD-10-CM

## 2019-10-02 DIAGNOSIS — Z79899 Other long term (current) drug therapy: Secondary | ICD-10-CM | POA: Insufficient documentation

## 2019-10-02 DIAGNOSIS — Z95828 Presence of other vascular implants and grafts: Secondary | ICD-10-CM

## 2019-10-02 LAB — CBC WITH DIFFERENTIAL (CANCER CENTER ONLY)
Abs Immature Granulocytes: 0.02 10*3/uL (ref 0.00–0.07)
Basophils Absolute: 0 10*3/uL (ref 0.0–0.1)
Basophils Relative: 0 %
Eosinophils Absolute: 0 10*3/uL (ref 0.0–0.5)
Eosinophils Relative: 0 %
HCT: 28.7 % — ABNORMAL LOW (ref 39.0–52.0)
Hemoglobin: 9.7 g/dL — ABNORMAL LOW (ref 13.0–17.0)
Immature Granulocytes: 1 %
Lymphocytes Relative: 15 %
Lymphs Abs: 0.6 10*3/uL — ABNORMAL LOW (ref 0.7–4.0)
MCH: 31.4 pg (ref 26.0–34.0)
MCHC: 33.8 g/dL (ref 30.0–36.0)
MCV: 92.9 fL (ref 80.0–100.0)
Monocytes Absolute: 0.3 10*3/uL (ref 0.1–1.0)
Monocytes Relative: 7 %
Neutro Abs: 3.3 10*3/uL (ref 1.7–7.7)
Neutrophils Relative %: 77 %
Platelet Count: 113 10*3/uL — ABNORMAL LOW (ref 150–400)
RBC: 3.09 MIL/uL — ABNORMAL LOW (ref 4.22–5.81)
RDW: 15.7 % — ABNORMAL HIGH (ref 11.5–15.5)
WBC Count: 4.2 10*3/uL (ref 4.0–10.5)
nRBC: 0 % (ref 0.0–0.2)

## 2019-10-02 LAB — CMP (CANCER CENTER ONLY)
ALT: 23 U/L (ref 0–44)
AST: 13 U/L — ABNORMAL LOW (ref 15–41)
Albumin: 3.5 g/dL (ref 3.5–5.0)
Alkaline Phosphatase: 91 U/L (ref 38–126)
Anion gap: 13 (ref 5–15)
BUN: 13 mg/dL (ref 8–23)
CO2: 24 mmol/L (ref 22–32)
Calcium: 8.4 mg/dL — ABNORMAL LOW (ref 8.9–10.3)
Chloride: 102 mmol/L (ref 98–111)
Creatinine: 0.83 mg/dL (ref 0.61–1.24)
GFR, Est AFR Am: >60 mL/min (ref >60)
GFR, Estimated: >60 mL/min (ref >60)
Glucose, Bld: 113 mg/dL — ABNORMAL HIGH (ref 70–99)
Potassium: 3.8 mmol/L (ref 3.5–5.1)
Sodium: 139 mmol/L (ref 135–145)
Total Bilirubin: 0.4 mg/dL (ref 0.3–1.2)
Total Protein: 6.4 g/dL — ABNORMAL LOW (ref 6.5–8.1)

## 2019-10-02 MED ORDER — SODIUM CHLORIDE 0.9% FLUSH
10.0000 mL | INTRAVENOUS | Status: DC | PRN
  Administered 2019-10-02: 09:00:00 10 mL via INTRAVENOUS
  Filled 2019-10-02: qty 10

## 2019-10-02 MED ORDER — SODIUM CHLORIDE 0.9 % IV SOLN
75.0000 mg/m2 | Freq: Once | INTRAVENOUS | Status: AC
  Administered 2019-10-02: 220 mg via INTRAVENOUS
  Filled 2019-10-02: qty 22

## 2019-10-02 MED ORDER — DEXAMETHASONE SODIUM PHOSPHATE 10 MG/ML IJ SOLN
INTRAMUSCULAR | Status: AC
  Filled 2019-10-02: qty 1

## 2019-10-02 MED ORDER — HEPARIN SOD (PORK) LOCK FLUSH 100 UNIT/ML IV SOLN
500.0000 [IU] | Freq: Once | INTRAVENOUS | Status: AC | PRN
  Administered 2019-10-02: 500 [IU]
  Filled 2019-10-02: qty 5

## 2019-10-02 MED ORDER — SODIUM CHLORIDE 0.9% FLUSH
10.0000 mL | INTRAVENOUS | Status: DC | PRN
  Administered 2019-10-02: 12:00:00 10 mL
  Filled 2019-10-02: qty 10

## 2019-10-02 MED ORDER — SODIUM CHLORIDE 0.9 % IV SOLN
Freq: Once | INTRAVENOUS | Status: AC
  Administered 2019-10-02: 09:00:00 via INTRAVENOUS
  Filled 2019-10-02: qty 250

## 2019-10-02 MED ORDER — DEXAMETHASONE SODIUM PHOSPHATE 10 MG/ML IJ SOLN
10.0000 mg | Freq: Once | INTRAMUSCULAR | Status: AC
  Administered 2019-10-02: 10:00:00 10 mg via INTRAVENOUS

## 2019-10-02 NOTE — Patient Instructions (Signed)
[facility] Discharge Instructions for Patients Receiving Chemotherapy  Today you received the following chemotherapy agents Docetaxel (TAXOTERE).  To help prevent nausea and vomiting after your treatment, we encourage you to take your nausea medication as prescribed.  If you develop nausea and vomiting that is not controlled by your nausea medication, call the clinic.   BELOW ARE SYMPTOMS THAT SHOULD BE REPORTED IMMEDIATELY:  *FEVER GREATER THAN 100.5 F  *CHILLS WITH OR WITHOUT FEVER  NAUSEA AND VOMITING THAT IS NOT CONTROLLED WITH YOUR NAUSEA MEDICATION  *UNUSUAL SHORTNESS OF BREATH  *UNUSUAL BRUISING OR BLEEDING  TENDERNESS IN MOUTH AND THROAT WITH OR WITHOUT PRESENCE OF ULCERS  *URINARY PROBLEMS  *BOWEL PROBLEMS  UNUSUAL RASH Items with * indicate a potential emergency and should be followed up as soon as possible.  Feel free to call the clinic should you have any questions or concerns. The clinic phone number is [phone].  Please show the [place] at check-in to the Emergency Department and triage nurse.  Coronavirus (COVID-19) Are you at risk?  Are you at risk for the Coronavirus (COVID-19)?  To be considered HIGH RISK for Coronavirus (COVID-19), you have to meet the following criteria:  . Traveled to [place], [place], [place], [place] or [place]; or in the [place] to [place], [place], [place], or [place]; and have fever, cough, and shortness of breath within the last 2 weeks of travel OR . Been in close contact with a person diagnosed with COVID-19 within the last 2 weeks and have fever, cough, and shortness of breath . IF YOU DO NOT MEET THESE CRITERIA, YOU ARE CONSIDERED LOW RISK FOR COVID-19.  What to do if you are HIGH RISK for COVID-19?  Marland Kitchen If you are having a medical emergency, call 911. . Seek medical care right away. Before you go to a doctor's office, urgent care or emergency department, call ahead and tell them  about your recent travel, contact with someone diagnosed with COVID-19, and your symptoms. You should receive instructions from your physician's office regarding next steps of care.  . When you arrive at healthcare provider, tell the healthcare staff immediately you have returned from visiting [place], [place], [place], [place] or [place]; or traveled in the [place] to [place], [place], [place], or [place]; in the last two weeks or you have been in close contact with a person diagnosed with COVID-19 in the last 2 weeks.   . Tell the health care staff about your symptoms: fever, cough and shortness of breath. . After you have been seen by a medical provider, you will be either: o Tested for (COVID-19) and discharged home on quarantine except to seek medical care if symptoms worsen, and asked to  - Stay home and avoid contact with others until you get your results (4-5 days)  - Avoid travel on public transportation if possible (such as bus, train, or airplane) or o Sent to the Emergency Department by EMS for evaluation, COVID-19 testing, and possible admission depending on your condition and test results.  What to do if you are LOW RISK for COVID-19?  Reduce your risk of any infection by using the same precautions used for avoiding the common cold or flu:  Marland Kitchen Wash your hands often with soap and warm water for at least 20 seconds.  If soap and water are not readily available, use an alcohol-based hand sanitizer with at least 60% alcohol.  . If coughing or sneezing,  cover your mouth and nose by coughing or sneezing into the elbow areas of your shirt or coat, into a tissue or into your sleeve (not your hands). . Avoid shaking hands with others and consider head nods or verbal greetings only. . Avoid touching your eyes, nose, or mouth with unwashed hands.  . Avoid close contact with people who are sick. . Avoid places or events with large numbers of people in one location, like concerts or  sporting events. . Carefully consider travel plans you have or are making. . If you are planning any travel outside or inside the [place], visit the CDC's Travelers' Health webpage for the latest health notices. . If you have some symptoms but not all symptoms, continue to monitor at home and seek medical attention if your symptoms worsen. . If you are having a medical emergency, call 911.   [place] / e-Visit: [email or web address]         [facility]: [facility]: [phone]                   [facility]: [phone]

## 2019-10-02 NOTE — Telephone Encounter (Signed)
Scheduled appt per 11/17 los.  Left a vm of the appt date and time.

## 2019-10-02 NOTE — Progress Notes (Signed)
Hematology and Oncology Follow Up Visit  Gregory Schneider [id number] 1955-12-01 63 y.o. 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  He was found to have Gleason score 4 + 5 = 9 disease at the time of diagnosis.  Prior Therapy:  Combined androgen deprivation with Lupron and Casodex. He had a good response and then developed a rise in the PSA with castrate level testosterone.  He is S/P Bilateral simple orchiectomy done on 12/25/2012.  Provenge started on 03/03/12. Therapy Ended in 03/31/2012. Case Zytiga started in 10/2013. Therapy discontinued in January 2018 because of progression of disease. Xofigo infusion to start on Apr 13, 2018.  He completed 6 months of therapy in November 2019. Status post prostate radiation between December 19, 2018 and January 01, 2019.  He received total of 30 Gy in 10 fractions to the prostate and the pubic rami. Xtandi 160 mg daily started in February 2018.  Therapy discontinued in October 2020 because of progression of disease.   Current therapy: Taxotere chemotherapy started on September 13, 2019.  He is here for cycle 2 of therapy.      Interim History:  Gregory Schneider presents today for a follow-up visit.  Since the last visit, he reports no major changes in his health.  He has tolerated the first cycle of chemotherapy without any complications.  He denies any nausea, vomiting or worsening neuropathy.  He did report some anorexia but his appetite recovered rather quickly.  He did report some fatigue tiredness which also improved.    Patient denied any alteration mental status, neuropathy, confusion or dizziness.  Denies any headaches or lethargy.  Denies any night sweats, weight loss or changes in appetite.  Denied orthopnea, dyspnea on exertion or chest discomfort.  Denies shortness of breath, difficulty breathing hemoptysis or cough.  Denies any  abdominal distention, nausea, early satiety or dyspepsia.  Denies any hematuria, frequency, dysuria or nocturia.  Denies any skin irritation, dryness or rash.  Denies any ecchymosis or petechiae.  Denies any lymphadenopathy or clotting.  Denies any heat or cold intolerance.  Denies any anxiety or depression.  Remaining review of system is negative.                 Medications: Without any changes on review. Current Outpatient Medications  Medication Sig Dispense Refill  . amLODipine (NORVASC) 2.5 MG tablet Take 2.5 mg by mouth daily.    Marland Kitchen buPROPion (ZYBAN) 150 MG 12 hr tablet Take 150 mg by mouth.    . calcium carbonate (OS-CAL) 600 MG TABS Take 600 mg by mouth daily.    . cholecalciferol (VITAMIN D) 1000 UNITS tablet Take 1,000 Units by mouth daily.    . clonazePAM (KLONOPIN) 1 MG tablet Take 1 mg by mouth as needed for anxiety (1-2 tablets daily as needed).     . Cyanocobalamin (RA VITAMIN B-12 TR) 1000 MCG TBCR Take by mouth.    . gabapentin (NEURONTIN) 300 MG capsule Take 1 capsule (300 mg total) by mouth at bedtime. 180 capsule 3  . HYDROcodone-acetaminophen (NORCO/VICODIN) 5-325 MG tablet Take 1-2 tablets by mouth every 6 (six) hours as needed. 90 tablet 0  . lidocaine-prilocaine (EMLA) cream Apply 1 application topically as needed. 30 g 0  . losartan-hydrochlorothiazide (HYZAAR) 100-25 MG tablet   0  . Multiple Vitamin (MULTIVITAMIN) tablet Take 1 tablet by mouth daily.    . prochlorperazine (COMPAZINE) 10  MG tablet Take 1 tablet (10 mg total) by mouth every 6 (six) hours as needed for nausea or vomiting. 30 tablet 3  . sertraline (ZOLOFT) 100 MG tablet Take 200 mg by mouth every morning.     Gillermina Phy 40 MG capsule TAKE 4 CAPSULES (160 MG TOTAL) BY MOUTH DAILY. 120 capsule 0   No current facility-administered medications for this visit.    Facility-Administered Medications Ordered in Other Visits  Medication Dose Route Frequency Provider Last Rate Last Dose  . sodium  chloride flush (NS) 0.9 % injection 10 mL  10 mL Intravenous PRN Wyatt Portela, MD   10 mL at 10/02/19 0834    Allergies:  Allergies  Allergen Reactions  . Penicillins Other (See Comments)    Patient stated,"I don't remember what kind of reaction because I was a kid."    His past medical history, social history and family history unchanged on review.  Physical Exam:     Blood pressure 131/71, pulse 68, temperature 98.2 F (36.8 C), temperature source Temporal, resp. rate 17, height 6\' 3"  (1.905 m), weight (!) 342 lb 8 oz (155.4 kg), SpO2 99 %.      ECOG: 1    General appearance: Alert, awake without any distress. Head: Atraumatic without abnormalities Oropharynx: Without any thrush or ulcers. Eyes: No scleral icterus. Lymph nodes: No lymphadenopathy noted in the cervical, supraclavicular, or axillary nodes Heart:regular rate and rhythm, without any murmurs or gallops.   Lung: Clear to auscultation without any rhonchi, wheezes or dullness to percussion. Abdomin: Soft, nontender without any shifting dullness or ascites. Musculoskeletal: No clubbing or cyanosis. Neurological: No motor or sensory deficits. Skin: No rashes or lesions. Psychiatric: Mood and affect appeared normal.            Lab Results: Lab Results  Component Value Date   WBC 5.6 09/13/2019   HGB 12.5 (L) 09/13/2019   HCT 36.6 (L) 09/13/2019   MCV 93.1 09/13/2019   PLT 116 (L) 09/13/2019     Chemistry      Component Value Date/Time   NA 137 09/13/2019 1202   NA 136 11/17/2017 0956   K 4.3 09/13/2019 1202   K 3.8 11/17/2017 0956   CL 99 09/13/2019 1202   CL 104 03/09/2013 0921   CO2 28 09/13/2019 1202   CO2 28 11/17/2017 0956   BUN 18 09/13/2019 1202   BUN 19.7 11/17/2017 0956   CREATININE 1.03 09/13/2019 1202   CREATININE 0.9 11/17/2017 0956      Component Value Date/Time   CALCIUM 9.4 09/13/2019 1202   CALCIUM 9.5 11/17/2017 0956   ALKPHOS 108 09/13/2019 1202   ALKPHOS 89  11/17/2017 0956   AST 16 09/13/2019 1202   AST 21 11/17/2017 0956   ALT 26 09/13/2019 1202   ALT 47 11/17/2017 0956   BILITOT 0.5 09/13/2019 1202   BILITOT 0.39 11/17/2017 0956       Results for Gregory Schneider, Gregory Schneider (MRN [id number]) as of 10/02/2019 08:51  Ref. Range 08/28/2019 09:13 09/13/2019 12:02  Prostate Specific Ag, Serum Latest Ref Range: 0.0 - 4.0 ng/mL 89.8 (H) 123.0 (H)        Impression and Plan:    63 year old man with  1.  Castration-resistant prostate cancer with disease to the bone since 2013.  He is currently on Taxotere chemotherapy and tolerated the first cycle without complications.  Risks and benefits of continuing this approach for a total of 10 cycles was discussed.  These complications include nausea,  vomiting, myelosuppression, neuropathy and infusion related complications.  Plan is to continue at this time is agreeable.   2. Androgen deprivation: He status post orchiectomy without any additional androgen deprivation.  3.  Bone directed therapy: He is on calcium and vitamin D supplements.  He has declined Xgeva or Zometa which she has received in the past.  4. Hypertension: His blood pressure is close to normal range at this time after Gillermina Phy has been discontinued..  5.  Prognosis and goals of care: Disease remains incurable although aggressive measures are warranted given his excellent performance status.  6.  Bone pain: Manageable at this time related to his prostate cancer.  Uses hydrocodone and Neurontin.  7.  IV access: Port-A-Cath inserted without any issues.  We will continue to use this for future chemotherapy cycles.  8.  Antiemetics: Compazine is available to him without any issues.    9.  Growth factor support: He is at risk of developing neutropenia and sepsis always receive growth factor support after each cycle.  10. Follow up: In 3 weeks for the next cycle of therapy.   25 minutes was spent with the patient face-to-face today.  More  than 50% of time was spent on updating his disease status, reviewing complications related therapy and future plan of care.    Zola Button MD 11/17/20208:41 AM

## 2019-10-03 ENCOUNTER — Telehealth: Payer: Self-pay

## 2019-10-03 LAB — PROSTATE-SPECIFIC AG, SERUM (LABCORP): Prostate Specific Ag, Serum: 135 ng/mL — ABNORMAL HIGH (ref 0.0–4.0)

## 2019-10-03 NOTE — Telephone Encounter (Signed)
Called patient and let him know PSA results and Dr. Hazeline Junker message below. Patient verbalized understanding.

## 2019-10-03 NOTE — Telephone Encounter (Signed)
-----   Message from Wyatt Portela, MD sent at 10/03/2019  8:13 AM EST ----- Please let him know his PSA went up. I expect this to go down after few chemo treatments.

## 2019-10-04 ENCOUNTER — Other Ambulatory Visit: Payer: Medicare HMO

## 2019-10-04 ENCOUNTER — Inpatient Hospital Stay: Payer: Medicare HMO

## 2019-10-04 ENCOUNTER — Ambulatory Visit: Payer: Medicare HMO

## 2019-10-04 ENCOUNTER — Ambulatory Visit: Payer: Medicare HMO | Admitting: Oncology

## 2019-10-04 ENCOUNTER — Other Ambulatory Visit: Payer: Self-pay

## 2019-10-04 VITALS — BP 134/83 | HR 76 | Temp 98.0°F | Resp 19

## 2019-10-04 DIAGNOSIS — Z79899 Other long term (current) drug therapy: Secondary | ICD-10-CM | POA: Diagnosis not present

## 2019-10-04 DIAGNOSIS — C61 Malignant neoplasm of prostate: Secondary | ICD-10-CM | POA: Diagnosis not present

## 2019-10-04 DIAGNOSIS — Z5111 Encounter for antineoplastic chemotherapy: Secondary | ICD-10-CM | POA: Diagnosis not present

## 2019-10-04 DIAGNOSIS — Z9079 Acquired absence of other genital organ(s): Secondary | ICD-10-CM | POA: Diagnosis not present

## 2019-10-04 MED ORDER — PEGFILGRASTIM-CBQV 6 MG/0.6ML ~~LOC~~ SOSY
PREFILLED_SYRINGE | SUBCUTANEOUS | Status: AC
  Filled 2019-10-04: qty 0.6

## 2019-10-04 MED ORDER — PEGFILGRASTIM-CBQV 6 MG/0.6ML ~~LOC~~ SOSY
6.0000 mg | PREFILLED_SYRINGE | Freq: Once | SUBCUTANEOUS | Status: AC
  Administered 2019-10-04: 6 mg via SUBCUTANEOUS

## 2019-10-04 NOTE — Patient Instructions (Signed)

## 2019-10-06 ENCOUNTER — Ambulatory Visit: Payer: Medicare HMO

## 2019-10-17 ENCOUNTER — Other Ambulatory Visit: Payer: Self-pay

## 2019-10-17 ENCOUNTER — Telehealth: Payer: Self-pay

## 2019-10-17 MED ORDER — GABAPENTIN 300 MG PO CAPS: 600.0000 mg | ORAL_CAPSULE | Freq: Two times a day (BID) | ORAL | 2 refills | Status: DC

## 2019-10-17 NOTE — Telephone Encounter (Signed)
Patient called to make Dr. Alen Blew aware that he has been taking Gabapentin 300 mg- 2 tablets twice daily- a total of 1200 mg daily instead of once daily as prescribed. Dr. Alen Blew ok'd for new prescription to be sent to patient's pharmacy for Gabapentin 300 mg- two tablets in the am and 2 tablets at night.

## 2019-10-17 NOTE — Progress Notes (Signed)
[place]

## 2019-10-24 ENCOUNTER — Inpatient Hospital Stay (HOSPITAL_BASED_OUTPATIENT_CLINIC_OR_DEPARTMENT_OTHER): Payer: Medicare HMO | Admitting: Oncology

## 2019-10-24 ENCOUNTER — Encounter: Payer: Self-pay | Admitting: Oncology

## 2019-10-24 ENCOUNTER — Other Ambulatory Visit: Payer: Self-pay

## 2019-10-24 ENCOUNTER — Inpatient Hospital Stay: Payer: Medicare HMO

## 2019-10-24 ENCOUNTER — Inpatient Hospital Stay: Payer: Medicare HMO | Attending: Oncology

## 2019-10-24 VITALS — BP 138/71 | HR 79 | Temp 98.0°F | Resp 18 | Ht 75.0 in | Wt 343.0 lb

## 2019-10-24 DIAGNOSIS — Z9079 Acquired absence of other genital organ(s): Secondary | ICD-10-CM | POA: Insufficient documentation

## 2019-10-24 DIAGNOSIS — Z79899 Other long term (current) drug therapy: Secondary | ICD-10-CM | POA: Insufficient documentation

## 2019-10-24 DIAGNOSIS — D696 Thrombocytopenia, unspecified: Secondary | ICD-10-CM | POA: Diagnosis not present

## 2019-10-24 DIAGNOSIS — C61 Malignant neoplasm of prostate: Secondary | ICD-10-CM

## 2019-10-24 DIAGNOSIS — C7951 Secondary malignant neoplasm of bone: Secondary | ICD-10-CM

## 2019-10-24 DIAGNOSIS — Z95828 Presence of other vascular implants and grafts: Secondary | ICD-10-CM

## 2019-10-24 DIAGNOSIS — Z5111 Encounter for antineoplastic chemotherapy: Secondary | ICD-10-CM | POA: Insufficient documentation

## 2019-10-24 LAB — CBC WITH DIFFERENTIAL (CANCER CENTER ONLY)
Abs Immature Granulocytes: 0.02 10*3/uL (ref 0.00–0.07)
Basophils Absolute: 0 10*3/uL (ref 0.0–0.1)
Basophils Relative: 0 %
Eosinophils Absolute: 0 10*3/uL (ref 0.0–0.5)
Eosinophils Relative: 0 %
HCT: 27.2 % — ABNORMAL LOW (ref 39.0–52.0)
Hemoglobin: 8.8 g/dL — ABNORMAL LOW (ref 13.0–17.0)
Immature Granulocytes: 1 %
Lymphocytes Relative: 12 %
Lymphs Abs: 0.5 10*3/uL — ABNORMAL LOW (ref 0.7–4.0)
MCH: 31.4 pg (ref 26.0–34.0)
MCHC: 32.4 g/dL (ref 30.0–36.0)
MCV: 97.1 fL (ref 80.0–100.0)
Monocytes Absolute: 0.3 10*3/uL (ref 0.1–1.0)
Monocytes Relative: 7 %
Neutro Abs: 3 10*3/uL (ref 1.7–7.7)
Neutrophils Relative %: 80 %
Platelet Count: 91 10*3/uL — ABNORMAL LOW (ref 150–400)
RBC: 2.8 MIL/uL — ABNORMAL LOW (ref 4.22–5.81)
RDW: 19.6 % — ABNORMAL HIGH (ref 11.5–15.5)
WBC Count: 3.8 10*3/uL — ABNORMAL LOW (ref 4.0–10.5)
nRBC: 0 % (ref 0.0–0.2)

## 2019-10-24 LAB — CMP (CANCER CENTER ONLY)
ALT: 23 U/L (ref 0–44)
AST: 15 U/L (ref 15–41)
Albumin: 3.5 g/dL (ref 3.5–5.0)
Alkaline Phosphatase: 93 U/L (ref 38–126)
Anion gap: 9 (ref 5–15)
BUN: 16 mg/dL (ref 8–23)
CO2: 28 mmol/L (ref 22–32)
Calcium: 8.5 mg/dL — ABNORMAL LOW (ref 8.9–10.3)
Chloride: 104 mmol/L (ref 98–111)
Creatinine: 0.9 mg/dL (ref 0.61–1.24)
GFR, Est AFR Am: >60 mL/min (ref >60)
GFR, Estimated: >60 mL/min (ref >60)
Glucose, Bld: 109 mg/dL — ABNORMAL HIGH (ref 70–99)
Potassium: 3.5 mmol/L (ref 3.5–5.1)
Sodium: 141 mmol/L (ref 135–145)
Total Bilirubin: 0.4 mg/dL (ref 0.3–1.2)
Total Protein: 6.3 g/dL — ABNORMAL LOW (ref 6.5–8.1)

## 2019-10-24 MED ORDER — SODIUM CHLORIDE 0.9 % IV SOLN
60.0000 mg/m2 | Freq: Once | INTRAVENOUS | Status: AC
  Administered 2019-10-24: 170 mg via INTRAVENOUS
  Filled 2019-10-24: qty 17

## 2019-10-24 MED ORDER — DEXAMETHASONE SODIUM PHOSPHATE 10 MG/ML IJ SOLN
INTRAMUSCULAR | Status: AC
  Filled 2019-10-24: qty 1

## 2019-10-24 MED ORDER — SODIUM CHLORIDE 0.9 % IV SOLN
Freq: Once | INTRAVENOUS | Status: AC
  Administered 2019-10-24: 10:00:00 via INTRAVENOUS
  Filled 2019-10-24: qty 250

## 2019-10-24 MED ORDER — SODIUM CHLORIDE 0.9% FLUSH
10.0000 mL | INTRAVENOUS | Status: DC | PRN
  Administered 2019-10-24: 10 mL via INTRAVENOUS
  Filled 2019-10-24: qty 10

## 2019-10-24 MED ORDER — SODIUM CHLORIDE 0.9% FLUSH
10.0000 mL | INTRAVENOUS | Status: DC | PRN
  Administered 2019-10-24: 10 mL
  Filled 2019-10-24: qty 10

## 2019-10-24 MED ORDER — DEXAMETHASONE SODIUM PHOSPHATE 10 MG/ML IJ SOLN
10.0000 mg | Freq: Once | INTRAMUSCULAR | Status: AC
  Administered 2019-10-24: 10 mg via INTRAVENOUS

## 2019-10-24 MED ORDER — HEPARIN SOD (PORK) LOCK FLUSH 100 UNIT/ML IV SOLN
500.0000 [IU] | Freq: Once | INTRAVENOUS | Status: AC | PRN
  Administered 2019-10-24: 12:00:00 500 [IU]
  Filled 2019-10-24: qty 5

## 2019-10-24 NOTE — Progress Notes (Signed)
Hematology and Oncology Follow Up Visit  Gregory Schneider [id number] 1956/04/20 63 y.o. 10/24/2019 9:49 AM   Principle Diagnosis: 63 year old man with advanced prostate cancer diagnosed in 2013.  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   Prior Therapy:  Combined androgen deprivation with Lupron and Casodex. He had a good response and then developed a rise in the PSA with castrate level testosterone.  He is S/P Bilateral simple orchiectomy done on 12/25/2012.  Provenge started on 03/03/12. Therapy Ended in 03/31/2012. Case Zytiga started in 10/2013. Therapy discontinued in January 2018 because of progression of disease. Xofigo infusion to start on Apr 13, 2018.  He completed 6 months of therapy in November 2019. Status post prostate radiation between December 19, 2018 and January 01, 2019.  He received total of 30 Gy in 10 fractions to the prostate and the pubic rami. Xtandi 160 mg daily started in February 2018.  Therapy discontinued in October 2020 because of progression of disease.   Current therapy: Taxotere chemotherapy started on September 13, 2019.  He is here for cycle 3 of therapy.      Interim History:  Gregory Schneider returns today for repeat evaluation.  Since the last visit, he reports no major complications related to chemotherapy.  He does report some mild nausea and fatigue as well as arthralgias related to growth factor support.  He does not report any bleeding complications.  He denies any recent hospitalization or illnesses.  His pain is overall manageable at this time.   He denied headaches, blurry vision, syncope or seizures.  Denies any fevers, chills or sweats.  Denied chest pain, palpitation, orthopnea or leg edema.  Denied cough, wheezing or hemoptysis.  Denied nausea, vomiting or abdominal pain.  Denies any constipation or diarrhea.  Denies any frequency urgency or hesitancy.  Denies any arthralgias or myalgias.  Denies any skin rashes  or lesions.  Denies any bleeding or clotting tendency.  Denies any easy bruising.  Denies any hair or nail changes.  Denies any anxiety or depression.  Remaining review of system is negative.     Medications: Updated without any changes. Current Outpatient Medications  Medication Sig Dispense Refill  . amLODipine (NORVASC) 2.5 MG tablet Take 2.5 mg by mouth daily.    Marland Kitchen buPROPion (ZYBAN) 150 MG 12 hr tablet Take 150 mg by mouth.    . calcium carbonate (OS-CAL) 600 MG TABS Take 600 mg by mouth daily.    . cholecalciferol (VITAMIN D) 1000 UNITS tablet Take 1,000 Units by mouth daily.    . clonazePAM (KLONOPIN) 1 MG tablet Take 1 mg by mouth as needed for anxiety (1-2 tablets daily as needed).     . Cyanocobalamin (RA VITAMIN B-12 TR) 1000 MCG TBCR Take by mouth.    . gabapentin (NEURONTIN) 300 MG capsule Take 2 capsules (600 mg total) by mouth 2 (two) times daily. 120 capsule 2  . HYDROcodone-acetaminophen (NORCO/VICODIN) 5-325 MG tablet Take 1-2 tablets by mouth every 6 (six) hours as needed. 90 tablet 0  . lidocaine-prilocaine (EMLA) cream Apply 1 application topically as needed. 30 g 0  . losartan-hydrochlorothiazide (HYZAAR) 100-25 MG tablet   0  . Multiple Vitamin (MULTIVITAMIN) tablet Take 1 tablet by mouth daily.    . prochlorperazine (COMPAZINE) 10 MG tablet Take 1 tablet (10 mg total) by mouth every 6 (six) hours as needed for nausea or vomiting. 30 tablet 3  . sertraline (ZOLOFT) 100 MG tablet Take  200 mg by mouth every morning.     Gillermina Phy 40 MG capsule TAKE 4 CAPSULES (160 MG TOTAL) BY MOUTH DAILY. 120 capsule 0   No current facility-administered medications for this visit.     Allergies:  Allergies  Allergen Reactions  . Penicillins Other (See Comments)    Patient stated,"I don't remember what kind of reaction because I was a kid."    His past medical history, social history and family history reviewed without any changes.  Physical Exam:     Blood pressure 138/71,  pulse 79, temperature 98 F (36.7 C), temperature source Temporal, resp. rate 18, height 6\' 3"  (1.905 m), weight (!) 343 lb (155.6 kg), SpO2 98 %.      ECOG: 1    General appearance: Comfortable appearing without any discomfort Head: Normocephalic without any trauma Oropharynx: Mucous membranes are moist and pink without any thrush or ulcers. Eyes: Pupils are equal and round reactive to light. Lymph nodes: No cervical, supraclavicular, inguinal or axillary lymphadenopathy.   Heart:regular rate and rhythm.  S1 and S2 without leg edema. Lung: Clear without any rhonchi or wheezes.  No dullness to percussion. Abdomin: Soft, nontender, nondistended with good bowel sounds.  No hepatosplenomegaly. Musculoskeletal: No joint deformity or effusion.  Full range of motion noted. Neurological: No deficits noted on motor, sensory and deep tendon reflex exam. Skin: No petechial rash or dryness.  Appeared moist.             Lab Results: Lab Results  Component Value Date   WBC 3.8 (L) 10/24/2019   HGB 8.8 (L) 10/24/2019   HCT 27.2 (L) 10/24/2019   MCV 97.1 10/24/2019   PLT 91 (L) 10/24/2019     Chemistry      Component Value Date/Time   NA 141 10/24/2019 0902   NA 136 11/17/2017 0956   K 3.5 10/24/2019 0902   K 3.8 11/17/2017 0956   CL 104 10/24/2019 0902   CL 104 03/09/2013 0921   CO2 28 10/24/2019 0902   CO2 28 11/17/2017 0956   BUN 16 10/24/2019 0902   BUN 19.7 11/17/2017 0956   CREATININE 0.90 10/24/2019 0902   CREATININE 0.9 11/17/2017 0956      Component Value Date/Time   CALCIUM 8.5 (L) 10/24/2019 0902   CALCIUM 9.5 11/17/2017 0956   ALKPHOS 93 10/24/2019 0902   ALKPHOS 89 11/17/2017 0956   AST 15 10/24/2019 0902   AST 21 11/17/2017 0956   ALT 23 10/24/2019 0902   ALT 47 11/17/2017 0956   BILITOT 0.4 10/24/2019 0902   BILITOT 0.39 11/17/2017 0956              Impression and Plan:    63 year old man with  1.  Advanced prostate cancer with  disease to the bone diagnosed in 2013.  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  He continues to tolerate Taxotere chemotherapy although is experiencing some mild symptoms including nausea and fatigue.  Risks and benefits of continuing the current therapy was reviewed at this time.  Dose modification of Taxotere may be needed given his cytopenias.  Dose reduction to Taxotere by 60 mg per metered squared would be necessary at this time for better tolerance and related to his thrombocytopenia.  He is agreeable to proceed at this time.   2. Androgen deprivation: He is status post orchiectomy without any need for additional androgen deprivation.  3.  Bone directed therapy: He has declined bone directed therapy at this time.  Continues to be on calcium and vitamin D supplements.   4. Hypertension: No issues with blood pressure noted at this time.  His amlodipine has been decreased by his primary care physician.  Blood pressure is normal at this time.  5.  Prognosis and goals of care: Therapy remains palliative although aggressive measures are warranted given his excellent performance status..  6.  Bone pain: Related to his prostate cancer manageable with the current pain medication using hydrocodone.  7.  IV access: Port-A-Cath continues to be in use without any issues.  8.  Antiemetics: No nausea or vomiting reported at this time.  Demadex available to him.  9.  Growth factor support: He received growth factor support after each cycle given his risk of neutropenia.  10. Follow up: He will return in 3 weeks for the next cycle of therapy.   25 minutes was spent with the patient face-to-face today.  More than 50% of time was dedicated to reviewing his disease status, treatment options and addressing complications related therapy.     Zola Button MD 12/9/20209:49 AM

## 2019-10-24 NOTE — Progress Notes (Signed)
Patient also approved for $200 Prostate grant which can be used for gas cards and medication only.  I have an updated approval letter for him reflecting the additional funds and will provide at earliest convenience.

## 2019-10-24 NOTE — Patient Instructions (Signed)
[facility] Discharge Instructions for Patients Receiving Chemotherapy  Today you received the following chemotherapy agents Docetaxel (TAXOTERE).  To help prevent nausea and vomiting after your treatment, we encourage you to take your nausea medication as prescribed.  If you develop nausea and vomiting that is not controlled by your nausea medication, call the clinic.   BELOW ARE SYMPTOMS THAT SHOULD BE REPORTED IMMEDIATELY:  *FEVER GREATER THAN 100.5 F  *CHILLS WITH OR WITHOUT FEVER  NAUSEA AND VOMITING THAT IS NOT CONTROLLED WITH YOUR NAUSEA MEDICATION  *UNUSUAL SHORTNESS OF BREATH  *UNUSUAL BRUISING OR BLEEDING  TENDERNESS IN MOUTH AND THROAT WITH OR WITHOUT PRESENCE OF ULCERS  *URINARY PROBLEMS  *BOWEL PROBLEMS  UNUSUAL RASH Items with * indicate a potential emergency and should be followed up as soon as possible.  Feel free to call the clinic should you have any questions or concerns. The clinic phone number is [phone].  Please show the [place] at check-in to the Emergency Department and triage nurse.  Coronavirus (COVID-19) Are you at risk?  Are you at risk for the Coronavirus (COVID-19)?  To be considered HIGH RISK for Coronavirus (COVID-19), you have to meet the following criteria:  . Traveled to [place], [place], [place], [place] or [place]; or in the [place] to [place], [place], [place], or [place]; and have fever, cough, and shortness of breath within the last 2 weeks of travel OR . Been in close contact with a person diagnosed with COVID-19 within the last 2 weeks and have fever, cough, and shortness of breath . IF YOU DO NOT MEET THESE CRITERIA, YOU ARE CONSIDERED LOW RISK FOR COVID-19.  What to do if you are HIGH RISK for COVID-19?  Marland Kitchen If you are having a medical emergency, call 911. . Seek medical care right away. Before you go to a doctor's office, urgent care or emergency department, call ahead and tell them  about your recent travel, contact with someone diagnosed with COVID-19, and your symptoms. You should receive instructions from your physician's office regarding next steps of care.  . When you arrive at healthcare provider, tell the healthcare staff immediately you have returned from visiting [place], [place], [place], [place] or [place]; or traveled in the [place] to [place], [place], [place], or [place]; in the last two weeks or you have been in close contact with a person diagnosed with COVID-19 in the last 2 weeks.   . Tell the health care staff about your symptoms: fever, cough and shortness of breath. . After you have been seen by a medical provider, you will be either: o Tested for (COVID-19) and discharged home on quarantine except to seek medical care if symptoms worsen, and asked to  - Stay home and avoid contact with others until you get your results (4-5 days)  - Avoid travel on public transportation if possible (such as bus, train, or airplane) or o Sent to the Emergency Department by EMS for evaluation, COVID-19 testing, and possible admission depending on your condition and test results.  What to do if you are LOW RISK for COVID-19?  Reduce your risk of any infection by using the same precautions used for avoiding the common cold or flu:  Marland Kitchen Wash your hands often with soap and warm water for at least 20 seconds.  If soap and water are not readily available, use an alcohol-based hand sanitizer with at least 60% alcohol.  . If coughing or sneezing,  cover your mouth and nose by coughing or sneezing into the elbow areas of your shirt or coat, into a tissue or into your sleeve (not your hands). . Avoid shaking hands with others and consider head nods or verbal greetings only. . Avoid touching your eyes, nose, or mouth with unwashed hands.  . Avoid close contact with people who are sick. . Avoid places or events with large numbers of people in one location, like concerts or  sporting events. . Carefully consider travel plans you have or are making. . If you are planning any travel outside or inside the [place], visit the CDC's Travelers' Health webpage for the latest health notices. . If you have some symptoms but not all symptoms, continue to monitor at home and seek medical attention if your symptoms worsen. . If you are having a medical emergency, call 911.   [place] / e-Visit: [email or web address]         [facility]: [facility]: [phone]                   [facility]: [phone]

## 2019-10-24 NOTE — Progress Notes (Signed)
Met with patient to obtain signature for one-time $700 [place].  Patient approved for the grant. Gave him a copy of the approval letter and expense sheet along with the [facility] OP pharmacy contact information.Discussed in detail how expenses are submitted and showed mailbox. He received a gas card today from the grant.  He has my card for any additional financial questions or concerns.

## 2019-10-25 ENCOUNTER — Telehealth: Payer: Self-pay | Admitting: Oncology

## 2019-10-25 ENCOUNTER — Telehealth: Payer: Self-pay

## 2019-10-25 DIAGNOSIS — C61 Malignant neoplasm of prostate: Secondary | ICD-10-CM | POA: Diagnosis not present

## 2019-10-25 DIAGNOSIS — C7951 Secondary malignant neoplasm of bone: Secondary | ICD-10-CM | POA: Diagnosis not present

## 2019-10-25 DIAGNOSIS — Z5111 Encounter for antineoplastic chemotherapy: Secondary | ICD-10-CM | POA: Diagnosis not present

## 2019-10-25 DIAGNOSIS — Z0000 Encounter for general adult medical examination without abnormal findings: Secondary | ICD-10-CM | POA: Diagnosis not present

## 2019-10-25 DIAGNOSIS — C787 Secondary malignant neoplasm of liver and intrahepatic bile duct: Secondary | ICD-10-CM | POA: Diagnosis not present

## 2019-10-25 DIAGNOSIS — R69 Illness, unspecified: Secondary | ICD-10-CM | POA: Diagnosis not present

## 2019-10-25 LAB — PROSTATE-SPECIFIC AG, SERUM (LABCORP): Prostate Specific Ag, Serum: 82.3 ng/mL — ABNORMAL HIGH (ref 0.0–4.0)

## 2019-10-25 NOTE — Telephone Encounter (Signed)
-----   Message from Wyatt Portela, MD sent at 10/25/2019  8:43 AM EST ----- Please let him know his PSA is down.

## 2019-10-25 NOTE — Telephone Encounter (Signed)
Scheduled appt per 12/9 los.  Patient will get a print out at their next scheduled appt.

## 2019-10-25 NOTE — Telephone Encounter (Signed)
Called and informed patient of information listed below. Patient verbalized understanding. Patient instructed to call office with any questions or concerns.

## 2019-10-26 ENCOUNTER — Inpatient Hospital Stay: Payer: Medicare HMO

## 2019-10-26 ENCOUNTER — Other Ambulatory Visit: Payer: Self-pay

## 2019-10-26 VITALS — BP 130/61 | HR 84 | Temp 98.3°F | Resp 20

## 2019-10-26 DIAGNOSIS — Z5111 Encounter for antineoplastic chemotherapy: Secondary | ICD-10-CM | POA: Diagnosis not present

## 2019-10-26 DIAGNOSIS — D696 Thrombocytopenia, unspecified: Secondary | ICD-10-CM | POA: Diagnosis not present

## 2019-10-26 DIAGNOSIS — Z79899 Other long term (current) drug therapy: Secondary | ICD-10-CM | POA: Diagnosis not present

## 2019-10-26 DIAGNOSIS — C61 Malignant neoplasm of prostate: Secondary | ICD-10-CM | POA: Diagnosis not present

## 2019-10-26 DIAGNOSIS — Z9079 Acquired absence of other genital organ(s): Secondary | ICD-10-CM | POA: Diagnosis not present

## 2019-10-26 DIAGNOSIS — C7951 Secondary malignant neoplasm of bone: Secondary | ICD-10-CM

## 2019-10-26 MED ORDER — PEGFILGRASTIM-CBQV 6 MG/0.6ML ~~LOC~~ SOSY
6.0000 mg | PREFILLED_SYRINGE | Freq: Once | SUBCUTANEOUS | Status: AC
  Administered 2019-10-26: 6 mg via SUBCUTANEOUS

## 2019-10-26 MED ORDER — PEGFILGRASTIM-CBQV 6 MG/0.6ML ~~LOC~~ SOSY
PREFILLED_SYRINGE | SUBCUTANEOUS | Status: AC
  Filled 2019-10-26: qty 0.6

## 2019-10-30 ENCOUNTER — Other Ambulatory Visit: Payer: Self-pay | Admitting: Oncology

## 2019-10-30 DIAGNOSIS — C61 Malignant neoplasm of prostate: Secondary | ICD-10-CM

## 2019-10-30 DIAGNOSIS — C7951 Secondary malignant neoplasm of bone: Secondary | ICD-10-CM

## 2019-10-30 MED ORDER — HYDROCODONE-ACETAMINOPHEN 5-325 MG PO TABS: 1.0000 | ORAL_TABLET | Freq: Four times a day (QID) | ORAL | 0 refills | Status: DC | PRN

## 2019-11-14 ENCOUNTER — Inpatient Hospital Stay: Payer: Medicare HMO

## 2019-11-14 ENCOUNTER — Other Ambulatory Visit: Payer: Self-pay

## 2019-11-14 ENCOUNTER — Inpatient Hospital Stay (HOSPITAL_BASED_OUTPATIENT_CLINIC_OR_DEPARTMENT_OTHER): Payer: Medicare HMO | Admitting: Oncology

## 2019-11-14 VITALS — BP 123/77 | HR 80 | Temp 97.8°F | Resp 20

## 2019-11-14 DIAGNOSIS — D696 Thrombocytopenia, unspecified: Secondary | ICD-10-CM | POA: Diagnosis not present

## 2019-11-14 DIAGNOSIS — Z95828 Presence of other vascular implants and grafts: Secondary | ICD-10-CM

## 2019-11-14 DIAGNOSIS — C61 Malignant neoplasm of prostate: Secondary | ICD-10-CM | POA: Diagnosis not present

## 2019-11-14 DIAGNOSIS — Z9079 Acquired absence of other genital organ(s): Secondary | ICD-10-CM | POA: Diagnosis not present

## 2019-11-14 DIAGNOSIS — Z5111 Encounter for antineoplastic chemotherapy: Secondary | ICD-10-CM | POA: Diagnosis not present

## 2019-11-14 DIAGNOSIS — Z79899 Other long term (current) drug therapy: Secondary | ICD-10-CM | POA: Diagnosis not present

## 2019-11-14 LAB — CMP (CANCER CENTER ONLY)
ALT: 15 U/L (ref 0–44)
AST: 10 U/L — ABNORMAL LOW (ref 15–41)
Albumin: 3.5 g/dL (ref 3.5–5.0)
Alkaline Phosphatase: 74 U/L (ref 38–126)
Anion gap: 10 (ref 5–15)
BUN: 10 mg/dL (ref 8–23)
CO2: 29 mmol/L (ref 22–32)
Calcium: 8.1 mg/dL — ABNORMAL LOW (ref 8.9–10.3)
Chloride: 100 mmol/L (ref 98–111)
Creatinine: 0.77 mg/dL (ref 0.61–1.24)
GFR, Est AFR Am: >60 mL/min (ref >60)
GFR, Estimated: >60 mL/min (ref >60)
Glucose, Bld: 124 mg/dL — ABNORMAL HIGH (ref 70–99)
Potassium: 3.5 mmol/L (ref 3.5–5.1)
Sodium: 139 mmol/L (ref 135–145)
Total Bilirubin: 0.6 mg/dL (ref 0.3–1.2)
Total Protein: 6.2 g/dL — ABNORMAL LOW (ref 6.5–8.1)

## 2019-11-14 LAB — CBC WITH DIFFERENTIAL (CANCER CENTER ONLY)
Abs Immature Granulocytes: 0.04 10*3/uL (ref 0.00–0.07)
Basophils Absolute: 0 10*3/uL (ref 0.0–0.1)
Basophils Relative: 0 %
Eosinophils Absolute: 0 10*3/uL (ref 0.0–0.5)
Eosinophils Relative: 0 %
HCT: 25.9 % — ABNORMAL LOW (ref 39.0–52.0)
Hemoglobin: 8.4 g/dL — ABNORMAL LOW (ref 13.0–17.0)
Immature Granulocytes: 1 %
Lymphocytes Relative: 6 %
Lymphs Abs: 0.4 10*3/uL — ABNORMAL LOW (ref 0.7–4.0)
MCH: 32.6 pg (ref 26.0–34.0)
MCHC: 32.4 g/dL (ref 30.0–36.0)
MCV: 100.4 fL — ABNORMAL HIGH (ref 80.0–100.0)
Monocytes Absolute: 0.4 10*3/uL (ref 0.1–1.0)
Monocytes Relative: 5 %
Neutro Abs: 6.2 10*3/uL (ref 1.7–7.7)
Neutrophils Relative %: 88 %
Platelet Count: 62 10*3/uL — ABNORMAL LOW (ref 150–400)
RBC: 2.58 MIL/uL — ABNORMAL LOW (ref 4.22–5.81)
RDW: 20.6 % — ABNORMAL HIGH (ref 11.5–15.5)
WBC Count: 7 10*3/uL (ref 4.0–10.5)
nRBC: 0 % (ref 0.0–0.2)

## 2019-11-14 MED ORDER — SODIUM CHLORIDE 0.9% FLUSH
10.0000 mL | Freq: Once | INTRAVENOUS | Status: AC
  Administered 2019-11-14: 10 mL via INTRAVENOUS
  Filled 2019-11-14: qty 10

## 2019-11-14 MED ORDER — HEPARIN SOD (PORK) LOCK FLUSH 100 UNIT/ML IV SOLN
500.0000 [IU] | Freq: Once | INTRAVENOUS | Status: AC
  Administered 2019-11-14: 11:00:00 500 [IU] via INTRAVENOUS
  Filled 2019-11-14: qty 5

## 2019-11-14 NOTE — Progress Notes (Signed)
Hematology and Oncology Follow Up Visit  Wolfgang Bovee [id number] 03-08-1956 63 y.o. 11/14/2019 10:21 AM   Principle Diagnosis: 63 year old man with castration-resistant prostate cancer with disease to the bone since 2013.  He initially was diagnosed in 2010 with Gleason score 4 + 5 = 9.   Prior Therapy:  Combined androgen deprivation with Lupron and Casodex. He had a good response and then developed a rise in the PSA with castrate level testosterone.  He is S/P Bilateral simple orchiectomy done on 12/25/2012.  Provenge started on 03/03/12. Therapy Ended in 03/31/2012. Case Zytiga started in 10/2013. Therapy discontinued in January 2018 because of progression of disease. Xofigo infusion to start on Apr 13, 2018.  He completed 6 months of therapy in November 2019. Status post prostate radiation between December 19, 2018 and January 01, 2019.  He received total of 30 Gy in 10 fractions to the prostate and the pubic rami. Xtandi 160 mg daily started in February 2018.  Therapy discontinued in October 2020 because of progression of disease.   Current therapy: Taxotere chemotherapy started on September 13, 2019.  He is here for cycle 4 of therapy.      Interim History:  Mr. Brincefield presents today for a follow-up visit.  Since the last visit, he reports more complications related to chemotherapy.  He has reported more fatigue and tiredness and limited performance status.  He is still able to eat and drink and hydrate properly without any vomiting or diarrhea.  He does report slight dyspnea but no shortness of breath at rest.  He denies any active bleeding at this time.  Formal status has declined since the last visit.  Patient denied any alteration mental status, neuropathy, confusion or dizziness.  Denies any headaches or lethargy.  Denies any night sweats, weight loss or changes in appetite.  Denied orthopnea, dyspnea on exertion or chest discomfort.  Denies shortness of breath, difficulty breathing  hemoptysis or cough.  Denies any abdominal distention, nausea, early satiety or dyspepsia.  Denies any hematuria, frequency, dysuria or nocturia.  Denies any skin irritation, dryness or rash.  Denies any ecchymosis or petechiae.  Denies any lymphadenopathy or clotting.  Denies any heat or cold intolerance.  Denies any anxiety or depression.  Remaining review of system is negative.       Medications: Unchanged on review. Current Outpatient Medications  Medication Sig Dispense Refill  . amLODipine (NORVASC) 2.5 MG tablet Take 2.5 mg by mouth daily.    Marland Kitchen buPROPion (ZYBAN) 150 MG 12 hr tablet Take 150 mg by mouth.    . calcium carbonate (OS-CAL) 600 MG TABS Take 600 mg by mouth daily.    . cholecalciferol (VITAMIN D) 1000 UNITS tablet Take 1,000 Units by mouth daily.    . clonazePAM (KLONOPIN) 1 MG tablet Take 1 mg by mouth as needed for anxiety (1-2 tablets daily as needed).     . Cyanocobalamin (RA VITAMIN B-12 TR) 1000 MCG TBCR Take by mouth.    . gabapentin (NEURONTIN) 300 MG capsule Take 2 capsules (600 mg total) by mouth 2 (two) times daily. 120 capsule 2  . HYDROcodone-acetaminophen (NORCO/VICODIN) 5-325 MG tablet Take 1-2 tablets by mouth every 6 (six) hours as needed. 90 tablet 0  . lidocaine-prilocaine (EMLA) cream Apply 1 application topically as needed. 30 g 0  . losartan-hydrochlorothiazide (HYZAAR) 100-25 MG tablet   0  . Multiple Vitamin (MULTIVITAMIN) tablet Take 1 tablet by mouth daily.    . prochlorperazine (COMPAZINE) 10 MG tablet  Take 1 tablet (10 mg total) by mouth every 6 (six) hours as needed for nausea or vomiting. 30 tablet 3  . sertraline (ZOLOFT) 100 MG tablet Take 200 mg by mouth every morning.      No current facility-administered medications for this visit.    Allergies:  Allergies  Allergen Reactions  . Penicillins Other (See Comments)    Patient stated,"I don't remember what kind of reaction because I was a kid."    His past medical history, social  history and family history updated without any changes.  Physical Exam:    Blood pressure 123/77, pulse 80, temperature 97.8 F (36.6 C), temperature source Temporal, resp. rate 20, SpO2 95 %.        ECOG: 1    General appearance: Alert, awake without any distress. Head: Atraumatic without abnormalities Oropharynx: Without any thrush or ulcers. Eyes: No scleral icterus. Lymph nodes: No lymphadenopathy noted in the cervical, supraclavicular, or axillary nodes Heart:regular rate and rhythm, without any murmurs or gallops.   Lung: Clear to auscultation without any rhonchi, wheezes or dullness to percussion. Abdomin: Soft, nontender without any shifting dullness or ascites. Musculoskeletal: No clubbing or cyanosis. Neurological: No motor or sensory deficits. Skin: No rashes or lesions. Psychiatric: Mood and affect appeared normal.            Lab Results: Lab Results  Component Value Date   WBC 3.8 (L) 10/24/2019   HGB 8.8 (L) 10/24/2019   HCT 27.2 (L) 10/24/2019   MCV 97.1 10/24/2019   PLT 91 (L) 10/24/2019     Chemistry      Component Value Date/Time   NA 141 10/24/2019 0902   NA 136 11/17/2017 0956   K 3.5 10/24/2019 0902   K 3.8 11/17/2017 0956   CL 104 10/24/2019 0902   CL 104 03/09/2013 0921   CO2 28 10/24/2019 0902   CO2 28 11/17/2017 0956   BUN 16 10/24/2019 0902   BUN 19.7 11/17/2017 0956   CREATININE 0.90 10/24/2019 0902   CREATININE 0.9 11/17/2017 0956      Component Value Date/Time   CALCIUM 8.5 (L) 10/24/2019 0902   CALCIUM 9.5 11/17/2017 0956   ALKPHOS 93 10/24/2019 0902   ALKPHOS 89 11/17/2017 0956   AST 15 10/24/2019 0902   AST 21 11/17/2017 0956   ALT 23 10/24/2019 0902   ALT 47 11/17/2017 0956   BILITOT 0.4 10/24/2019 0902   BILITOT 0.39 11/17/2017 0956       Results for TRAYLIN, FILBERT (MRN [id number]) as of 11/14/2019 10:23  Ref. Range 10/02/2019 08:34 10/24/2019 09:02  Prostate Specific Ag, Serum Latest Ref Range: 0.0 -  4.0 ng/mL 135.0 (H) 82.3 (H)         Impression and Plan:    63 year old man with  1.  Castration-resistant prostate cancer with disease to the bone diagnosed in 2013.    He has tolerated Taxotere without any major complications with excellent PSA response at this time.  His PSA currently at 23 which is a drop from 135.  Risks and benefits of continuing this therapy was discussed.  Potential complications include nausea, vomiting, myelosuppression in addition to worsening neuropathy and nail changes were reviewed.  After discussion today, we will hold her chemotherapy given his cytopenia and resume in 3 weeks with potential increase frequency between treatments at that time for better tolerance and better recovery.   2. Androgen deprivation: No additional androgen deprivation is needed.  He is status post orchiectomy.  3.  Bone directed therapy: I recommended continuing calcium and vitamin D supplements.  He has declined further Zometa treatments.   4. Hypertension: Blood pressure is within normal range at this time.  5.  Prognosis and goals of care: Disease remains incurable although palliative approach is reasonable given his excellent performance status.  6.  Bone pain: Currently on hydrocodone which is manageable.  7.  IV access: Port-A-Cath remains in use at this time.  8.  Antiemetics: Compazine is available to him without any recent nausea or vomiting.  9.  Growth factor support: He is at risk of developing neutropenia and sepsis.  He will receive growth factor support after each cycle of therapy.   10. Follow up: In 3 weeks for the next cycle of therapy.   25 minutes was spent with the patient face-to-face today.  More than 50% of time was spent on reviewing his disease status, treatment options and answering questions regarding future plan of care.    Zola Button MD 12/30/202010:21 AM

## 2019-11-14 NOTE — Patient Instructions (Signed)

## 2019-11-15 ENCOUNTER — Telehealth: Payer: Self-pay

## 2019-11-15 ENCOUNTER — Telehealth: Payer: Self-pay | Admitting: Oncology

## 2019-11-15 LAB — PROSTATE-SPECIFIC AG, SERUM (LABCORP): Prostate Specific Ag, Serum: 60.3 ng/mL — ABNORMAL HIGH (ref 0.0–4.0)

## 2019-11-15 NOTE — Telephone Encounter (Signed)
Called patient to give him PSA results. No answer. Left voicemail for patient to return call to [phone].

## 2019-11-15 NOTE — Telephone Encounter (Signed)
Patient returned call to office and was made aware of PSA results. Patient verbalized understanding.

## 2019-11-15 NOTE — Telephone Encounter (Signed)
Scheduled appt per 12/30 los. °

## 2019-11-15 NOTE — Telephone Encounter (Signed)
-----   Message from Wyatt Portela, MD sent at 11/15/2019  8:22 AM EST ----- Please let him know his PSA is down.

## 2019-11-17 ENCOUNTER — Ambulatory Visit: Payer: Medicare HMO

## 2019-11-19 DIAGNOSIS — Z03818 Encounter for observation for suspected exposure to other biological agents ruled out: Secondary | ICD-10-CM | POA: Diagnosis not present

## 2019-12-05 ENCOUNTER — Inpatient Hospital Stay: Payer: Medicare HMO | Attending: Oncology

## 2019-12-05 ENCOUNTER — Inpatient Hospital Stay: Payer: Medicare HMO

## 2019-12-05 ENCOUNTER — Inpatient Hospital Stay (HOSPITAL_BASED_OUTPATIENT_CLINIC_OR_DEPARTMENT_OTHER): Payer: Medicare HMO | Admitting: Oncology

## 2019-12-05 ENCOUNTER — Other Ambulatory Visit: Payer: Self-pay

## 2019-12-05 VITALS — BP 146/77 | HR 75 | Temp 98.2°F | Resp 17 | Ht 75.0 in | Wt 343.4 lb

## 2019-12-05 DIAGNOSIS — Z9079 Acquired absence of other genital organ(s): Secondary | ICD-10-CM | POA: Diagnosis not present

## 2019-12-05 DIAGNOSIS — Z79899 Other long term (current) drug therapy: Secondary | ICD-10-CM | POA: Insufficient documentation

## 2019-12-05 DIAGNOSIS — Z5111 Encounter for antineoplastic chemotherapy: Secondary | ICD-10-CM | POA: Diagnosis present

## 2019-12-05 DIAGNOSIS — C61 Malignant neoplasm of prostate: Secondary | ICD-10-CM

## 2019-12-05 DIAGNOSIS — Z95828 Presence of other vascular implants and grafts: Secondary | ICD-10-CM

## 2019-12-05 DIAGNOSIS — C7951 Secondary malignant neoplasm of bone: Secondary | ICD-10-CM | POA: Diagnosis not present

## 2019-12-05 DIAGNOSIS — Z7689 Persons encountering health services in other specified circumstances: Secondary | ICD-10-CM | POA: Diagnosis not present

## 2019-12-05 LAB — CMP (CANCER CENTER ONLY)
ALT: 24 U/L (ref 0–44)
AST: 16 U/L (ref 15–41)
Albumin: 3.9 g/dL (ref 3.5–5.0)
Alkaline Phosphatase: 111 U/L (ref 38–126)
Anion gap: 11 (ref 5–15)
BUN: 14 mg/dL (ref 8–23)
CO2: 26 mmol/L (ref 22–32)
Calcium: 8.4 mg/dL — ABNORMAL LOW (ref 8.9–10.3)
Chloride: 103 mmol/L (ref 98–111)
Creatinine: 0.85 mg/dL (ref 0.61–1.24)
GFR, Est AFR Am: >60 mL/min (ref >60)
GFR, Estimated: >60 mL/min (ref >60)
Glucose, Bld: 119 mg/dL — ABNORMAL HIGH (ref 70–99)
Potassium: 3.8 mmol/L (ref 3.5–5.1)
Sodium: 140 mmol/L (ref 135–145)
Total Bilirubin: 0.4 mg/dL (ref 0.3–1.2)
Total Protein: 6.8 g/dL (ref 6.5–8.1)

## 2019-12-05 LAB — CBC WITH DIFFERENTIAL (CANCER CENTER ONLY)
Abs Immature Granulocytes: 0.01 10*3/uL (ref 0.00–0.07)
Basophils Absolute: 0 10*3/uL (ref 0.0–0.1)
Basophils Relative: 0 %
Eosinophils Absolute: 0.2 10*3/uL (ref 0.0–0.5)
Eosinophils Relative: 4 %
HCT: 33.8 % — ABNORMAL LOW (ref 39.0–52.0)
Hemoglobin: 10.8 g/dL — ABNORMAL LOW (ref 13.0–17.0)
Immature Granulocytes: 0 %
Lymphocytes Relative: 11 %
Lymphs Abs: 0.5 10*3/uL — ABNORMAL LOW (ref 0.7–4.0)
MCH: 30.9 pg (ref 26.0–34.0)
MCHC: 32 g/dL (ref 30.0–36.0)
MCV: 96.6 fL (ref 80.0–100.0)
Monocytes Absolute: 0.3 10*3/uL (ref 0.1–1.0)
Monocytes Relative: 7 %
Neutro Abs: 3.1 10*3/uL (ref 1.7–7.7)
Neutrophils Relative %: 78 %
Platelet Count: 100 10*3/uL — ABNORMAL LOW (ref 150–400)
RBC: 3.5 MIL/uL — ABNORMAL LOW (ref 4.22–5.81)
RDW: 16.1 % — ABNORMAL HIGH (ref 11.5–15.5)
WBC Count: 4 10*3/uL (ref 4.0–10.5)
nRBC: 0 % (ref 0.0–0.2)

## 2019-12-05 MED ORDER — SODIUM CHLORIDE 0.9 % IV SOLN
Freq: Once | INTRAVENOUS | Status: AC
  Filled 2019-12-05: qty 250

## 2019-12-05 MED ORDER — DEXAMETHASONE SODIUM PHOSPHATE 10 MG/ML IJ SOLN
INTRAMUSCULAR | Status: AC
  Filled 2019-12-05: qty 1

## 2019-12-05 MED ORDER — SODIUM CHLORIDE 0.9 % IV SOLN
60.0000 mg/m2 | Freq: Once | INTRAVENOUS | Status: AC
  Administered 2019-12-05: 13:00:00 170 mg via INTRAVENOUS
  Filled 2019-12-05: qty 17

## 2019-12-05 MED ORDER — SODIUM CHLORIDE 0.9% FLUSH
10.0000 mL | INTRAVENOUS | Status: DC | PRN
  Administered 2019-12-05: 10 mL
  Filled 2019-12-05: qty 10

## 2019-12-05 MED ORDER — HEPARIN SOD (PORK) LOCK FLUSH 100 UNIT/ML IV SOLN
500.0000 [IU] | Freq: Once | INTRAVENOUS | Status: AC | PRN
  Administered 2019-12-05: 500 [IU]
  Filled 2019-12-05: qty 5

## 2019-12-05 MED ORDER — SODIUM CHLORIDE 0.9% FLUSH
10.0000 mL | INTRAVENOUS | Status: DC | PRN
  Administered 2019-12-05: 10 mL via INTRAVENOUS
  Filled 2019-12-05: qty 10

## 2019-12-05 MED ORDER — DEXAMETHASONE SODIUM PHOSPHATE 10 MG/ML IJ SOLN
10.0000 mg | Freq: Once | INTRAMUSCULAR | Status: AC
  Administered 2019-12-05: 13:00:00 10 mg via INTRAVENOUS

## 2019-12-05 NOTE — Patient Instructions (Signed)

## 2019-12-05 NOTE — Progress Notes (Signed)
Hematology and Oncology Follow Up Visit  Seiko Ouyang [id number] September 29, 1956 64 y.o. 12/05/2019 12:01 PM   Principle Diagnosis: 64 year old man with advanced prostate cancer with disease to the bone since 2013.  He was found to have Gleason score 4 + 5 = 9 diagnosis in 2010.  Prior Therapy:  Combined androgen deprivation with Lupron and Casodex. He had a good response and then developed a rise in the PSA with castrate level testosterone.  He is S/P Bilateral simple orchiectomy done on 12/25/2012.  Provenge started on 03/03/12. Therapy Ended in 03/31/2012. Case Zytiga started in 10/2013. Therapy discontinued in January 2018 because of progression of disease. Xofigo infusion to start on Apr 13, 2018.  He completed 6 months of therapy in November 2019. Status post prostate radiation between December 19, 2018 and January 01, 2019.  He received total of 30 Gy in 10 fractions to the prostate and the pubic rami. Xtandi 160 mg daily started in February 2018.  Therapy discontinued in October 2020 because of progression of disease.   Current therapy: Taxotere chemotherapy started on September 13, 2019.  He completed 3 cycles of therapy.  Cycle 4 was delayed till December 05, 2019.      Interim History:  Mr. Oneal returns today for a repeat evaluation.  Since the last visit, he is feeling better at this time after withholding the last chemotherapy cycle.  He has regained most activities of daily living and is eating better.  He denies any nausea, vomiting or excessive fatigue.  He denies any worsening neuropathy or abdominal distention.  He is able to ambulate without any major difficulties..       Medications: Reviewed without any changes. Current Outpatient Medications  Medication Sig Dispense Refill  . buPROPion (ZYBAN) 150 MG 12 hr tablet Take 150 mg by mouth.    . calcium carbonate (OS-CAL) 600 MG TABS Take 600 mg by mouth daily.    . cholecalciferol (VITAMIN D) 1000 UNITS tablet Take 1,000  Units by mouth daily.    . clonazePAM (KLONOPIN) 1 MG tablet Take 1 mg by mouth as needed for anxiety (1-2 tablets daily as needed).     . Cyanocobalamin (RA VITAMIN B-12 TR) 1000 MCG TBCR Take by mouth.    . gabapentin (NEURONTIN) 300 MG capsule Take 2 capsules (600 mg total) by mouth 2 (two) times daily. 120 capsule 2  . HYDROcodone-acetaminophen (NORCO/VICODIN) 5-325 MG tablet Take 1-2 tablets by mouth every 6 (six) hours as needed. 90 tablet 0  . lidocaine-prilocaine (EMLA) cream Apply 1 application topically as needed. 30 g 0  . Multiple Vitamin (MULTIVITAMIN) tablet Take 1 tablet by mouth daily.    . prochlorperazine (COMPAZINE) 10 MG tablet Take 1 tablet (10 mg total) by mouth every 6 (six) hours as needed for nausea or vomiting. 30 tablet 3  . sertraline (ZOLOFT) 100 MG tablet Take 200 mg by mouth every morning.     Marland Kitchen amLODipine (NORVASC) 2.5 MG tablet Take 2.5 mg by mouth daily.    Marland Kitchen losartan-hydrochlorothiazide (HYZAAR) 100-25 MG tablet   0   No current facility-administered medications for this visit.    Allergies:  Allergies  Allergen Reactions  . Penicillins Other (See Comments)    Patient stated,"I don't remember what kind of reaction because I was a kid."      Physical Exam:    Blood pressure (!) 146/77, pulse 75, temperature 98.2 F (36.8 C), temperature source Temporal, resp. rate 17, height 6\' 3"  (1.905 m),  weight (!) 343 lb 6.4 oz (155.8 kg), SpO2 96 %.        ECOG: 1   General appearance: Comfortable appearing without any discomfort Head: Normocephalic without any trauma Oropharynx: Mucous membranes are moist and pink without any thrush or ulcers. Eyes: Pupils are equal and round reactive to light. Lymph nodes: No cervical, supraclavicular, inguinal or axillary lymphadenopathy.   Heart:regular rate and rhythm.  S1 and S2 without leg edema. Lung: Clear without any rhonchi or wheezes.  No dullness to percussion. Abdomin: Soft, nontender, nondistended  with good bowel sounds.  No hepatosplenomegaly. Musculoskeletal: No joint deformity or effusion.  Full range of motion noted. Neurological: No deficits noted on motor, sensory and deep tendon reflex exam. Skin: No petechial rash or dryness.  Appeared moist.              Lab Results: Lab Results  Component Value Date   WBC 4.0 12/05/2019   HGB 10.8 (L) 12/05/2019   HCT 33.8 (L) 12/05/2019   MCV 96.6 12/05/2019   PLT 100 (L) 12/05/2019     Chemistry      Component Value Date/Time   NA 139 11/14/2019 1015   NA 136 11/17/2017 0956   K 3.5 11/14/2019 1015   K 3.8 11/17/2017 0956   CL 100 11/14/2019 1015   CL 104 03/09/2013 0921   CO2 29 11/14/2019 1015   CO2 28 11/17/2017 0956   BUN 10 11/14/2019 1015   BUN 19.7 11/17/2017 0956   CREATININE 0.77 11/14/2019 1015   CREATININE 0.9 11/17/2017 0956      Component Value Date/Time   CALCIUM 8.1 (L) 11/14/2019 1015   CALCIUM 9.5 11/17/2017 0956   ALKPHOS 74 11/14/2019 1015   ALKPHOS 89 11/17/2017 0956   AST 10 (L) 11/14/2019 1015   AST 21 11/17/2017 0956   ALT 15 11/14/2019 1015   ALT 47 11/17/2017 0956   BILITOT 0.6 11/14/2019 1015   BILITOT 0.39 11/17/2017 0956          Results for RANDEN, MINIARD (MRN [id number]) as of 12/05/2019 11:48  Ref. Range 10/02/2019 08:34 10/24/2019 09:02 11/14/2019 10:15  Prostate Specific Ag, Serum Latest Ref Range: 0.0 - 4.0 ng/mL 135.0 (H) 82.3 (H) 60.3 (H)       Impression and Plan:    64 year old man with  1.  Advanced prostate cancer with disease to the bone diagnosed in 2013.  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  He is currently on Taxotere chemotherapy with cycle 4 needed to be delayed because of cytopenias and complications related to previous infusions.  He is feeling better at this time and ready to proceed.  Risks and benefits of continuing chemotherapy was discussed.  Complications include nausea, vomiting, myelosuppression and sepsis are all discussed.  We will  proceed with Taxotere chemotherapy at a reduced dose.  His PSA is showing excellent response so far with more than 50% wine after 3 cycles.   2. Androgen deprivation: He is status post orchiectomy without any additional androgen deprivation.  3.  Bone directed therapy: He continues to be on calcium and vitamin D supplement.  Zometa has been deferred at this time.    4.  Prognosis and goals of care: Therapy remains palliative at this time although his performance status is excellent and aggressive measures are warranted.  5.  Thrombocytopenia: Related to chemotherapy and has recovered at that time.  No bleeding noted.  6.  Bone pain: Manageable at this time with hydrocodone.  This  will be refilled for him as needed.  7.  IV access: Port-A-Cath currently in use without any issues or complications.  8.  Antiemetics: No nausea or vomiting reported at this time.  Compazine is available to him.  9.  Growth factor support: He will receive growth factor support after each cycle of therapy given his risk of neutropenia.  10. Follow up: He will return in 3 weeks for the next cycle of therapy.   30 minutes was spent on this encounter today.  Time was dedicated to reviewing laboratory data, spent on reviewing his disease data, complications related to chemotherapy and future plan of care.    Zola Button MD 1/20/202112:01 PM

## 2019-12-05 NOTE — Patient Instructions (Signed)
[facility] Discharge Instructions for Patients Receiving Chemotherapy  Today you received the following chemotherapy agents Docetaxel (TAXOTERE).  To help prevent nausea and vomiting after your treatment, we encourage you to take your nausea medication as prescribed.  If you develop nausea and vomiting that is not controlled by your nausea medication, call the clinic.   BELOW ARE SYMPTOMS THAT SHOULD BE REPORTED IMMEDIATELY:  *FEVER GREATER THAN 100.5 F  *CHILLS WITH OR WITHOUT FEVER  NAUSEA AND VOMITING THAT IS NOT CONTROLLED WITH YOUR NAUSEA MEDICATION  *UNUSUAL SHORTNESS OF BREATH  *UNUSUAL BRUISING OR BLEEDING  TENDERNESS IN MOUTH AND THROAT WITH OR WITHOUT PRESENCE OF ULCERS  *URINARY PROBLEMS  *BOWEL PROBLEMS  UNUSUAL RASH Items with * indicate a potential emergency and should be followed up as soon as possible.  Feel free to call the clinic should you have any questions or concerns. The clinic phone number is [phone].  Please show the [facility] at check-in to the Emergency Department and triage nurse.  Coronavirus (COVID-19) Are you at risk?  Are you at risk for the Coronavirus (COVID-19)?  To be considered HIGH RISK for Coronavirus (COVID-19), you have to meet the following criteria:  . Traveled to [place], [place], [place], [place] or [place]; or in the [place] to [place], [place], [place], or [place]; and have fever, cough, and shortness of breath within the last 2 weeks of travel OR . Been in close contact with a person diagnosed with COVID-19 within the last 2 weeks and have fever, cough, and shortness of breath . IF YOU DO NOT MEET THESE CRITERIA, YOU ARE CONSIDERED LOW RISK FOR COVID-19.  What to do if you are HIGH RISK for COVID-19?  Marland Kitchen If you are having a medical emergency, call 911. . Seek medical care right away. Before you go to a doctor's office, urgent care or emergency department, call ahead and tell them  about your recent travel, contact with someone diagnosed with COVID-19, and your symptoms. You should receive instructions from your physician's office regarding next steps of care.  . When you arrive at healthcare provider, tell the healthcare staff immediately you have returned from visiting [place], [place], [place], [place] or [place]; or traveled in the [place] to [place], [place], [place], or [place]; in the last two weeks or you have been in close contact with a person diagnosed with COVID-19 in the last 2 weeks.   . Tell the health care staff about your symptoms: fever, cough and shortness of breath. . After you have been seen by a medical provider, you will be either: o Tested for (COVID-19) and discharged home on quarantine except to seek medical care if symptoms worsen, and asked to  - Stay home and avoid contact with others until you get your results (4-5 days)  - Avoid travel on public transportation if possible (such as bus, train, or airplane) or o Sent to the Emergency Department by EMS for evaluation, COVID-19 testing, and possible admission depending on your condition and test results.  What to do if you are LOW RISK for COVID-19?  Reduce your risk of any infection by using the same precautions used for avoiding the common cold or flu:  Marland Kitchen Wash your hands often with soap and warm water for at least 20 seconds.  If soap and water are not readily available, use an alcohol-based hand sanitizer with at least 60% alcohol.  . If coughing or sneezing,  cover your mouth and nose by coughing or sneezing into the elbow areas of your shirt or coat, into a tissue or into your sleeve (not your hands). . Avoid shaking hands with others and consider head nods or verbal greetings only. . Avoid touching your eyes, nose, or mouth with unwashed hands.  . Avoid close contact with people who are sick. . Avoid places or events with large numbers of people in one location, like concerts or  sporting events. . Carefully consider travel plans you have or are making. . If you are planning any travel outside or inside the [place], visit the CDC's Travelers' Health webpage for the latest health notices. . If you have some symptoms but not all symptoms, continue to monitor at home and seek medical attention if your symptoms worsen. . If you are having a medical emergency, call 911.   [place] / e-Visit: [email or web address]         [facility]: [facility]: [phone]                   [facility]: [phone]

## 2019-12-06 ENCOUNTER — Telehealth: Payer: Self-pay | Admitting: Oncology

## 2019-12-06 LAB — PROSTATE-SPECIFIC AG, SERUM (LABCORP): Prostate Specific Ag, Serum: 85.9 ng/mL — ABNORMAL HIGH (ref 0.0–4.0)

## 2019-12-06 NOTE — Telephone Encounter (Signed)
Scheduled appt per 1/20 los. 

## 2019-12-07 ENCOUNTER — Inpatient Hospital Stay: Payer: Medicare HMO

## 2019-12-07 ENCOUNTER — Other Ambulatory Visit: Payer: Self-pay

## 2019-12-07 VITALS — BP 118/57 | HR 87 | Temp 98.0°F | Resp 18

## 2019-12-07 DIAGNOSIS — C61 Malignant neoplasm of prostate: Secondary | ICD-10-CM | POA: Diagnosis not present

## 2019-12-07 DIAGNOSIS — Z7689 Persons encountering health services in other specified circumstances: Secondary | ICD-10-CM | POA: Diagnosis not present

## 2019-12-07 DIAGNOSIS — Z5111 Encounter for antineoplastic chemotherapy: Secondary | ICD-10-CM | POA: Diagnosis not present

## 2019-12-07 DIAGNOSIS — C7951 Secondary malignant neoplasm of bone: Secondary | ICD-10-CM | POA: Diagnosis not present

## 2019-12-07 DIAGNOSIS — Z79899 Other long term (current) drug therapy: Secondary | ICD-10-CM | POA: Diagnosis not present

## 2019-12-07 DIAGNOSIS — Z9079 Acquired absence of other genital organ(s): Secondary | ICD-10-CM | POA: Diagnosis not present

## 2019-12-07 MED ORDER — PEGFILGRASTIM-CBQV 6 MG/0.6ML ~~LOC~~ SOSY
6.0000 mg | PREFILLED_SYRINGE | Freq: Once | SUBCUTANEOUS | Status: AC
  Administered 2019-12-07: 6 mg via SUBCUTANEOUS

## 2019-12-07 MED ORDER — PEGFILGRASTIM-CBQV 6 MG/0.6ML ~~LOC~~ SOSY
PREFILLED_SYRINGE | SUBCUTANEOUS | Status: AC
  Filled 2019-12-07: qty 0.6

## 2019-12-10 ENCOUNTER — Telehealth: Payer: Self-pay

## 2019-12-10 NOTE — Telephone Encounter (Signed)
Patient called office asking what his PSA level was last week.  Patient informed PSA was 85.9. Patient verbalized understanding.  Patient instructed to call office if he had any additional questions or concerns.

## 2019-12-26 ENCOUNTER — Inpatient Hospital Stay: Payer: Medicare HMO

## 2019-12-26 ENCOUNTER — Other Ambulatory Visit: Payer: Self-pay

## 2019-12-26 ENCOUNTER — Inpatient Hospital Stay: Payer: Medicare HMO | Attending: Oncology

## 2019-12-26 ENCOUNTER — Inpatient Hospital Stay (HOSPITAL_BASED_OUTPATIENT_CLINIC_OR_DEPARTMENT_OTHER): Payer: Medicare HMO | Admitting: Oncology

## 2019-12-26 ENCOUNTER — Telehealth: Payer: Self-pay | Admitting: Oncology

## 2019-12-26 VITALS — BP 146/80 | HR 72 | Temp 97.8°F | Resp 18 | Ht 75.0 in | Wt 351.3 lb

## 2019-12-26 DIAGNOSIS — C61 Malignant neoplasm of prostate: Secondary | ICD-10-CM | POA: Diagnosis not present

## 2019-12-26 DIAGNOSIS — D696 Thrombocytopenia, unspecified: Secondary | ICD-10-CM | POA: Insufficient documentation

## 2019-12-26 DIAGNOSIS — Z79899 Other long term (current) drug therapy: Secondary | ICD-10-CM | POA: Diagnosis not present

## 2019-12-26 DIAGNOSIS — Z5111 Encounter for antineoplastic chemotherapy: Secondary | ICD-10-CM | POA: Diagnosis present

## 2019-12-26 DIAGNOSIS — G629 Polyneuropathy, unspecified: Secondary | ICD-10-CM | POA: Insufficient documentation

## 2019-12-26 DIAGNOSIS — C7951 Secondary malignant neoplasm of bone: Secondary | ICD-10-CM | POA: Diagnosis not present

## 2019-12-26 LAB — CBC WITH DIFFERENTIAL (CANCER CENTER ONLY)
Abs Immature Granulocytes: 0.01 10*3/uL (ref 0.00–0.07)
Basophils Absolute: 0 10*3/uL (ref 0.0–0.1)
Basophils Relative: 0 %
Eosinophils Absolute: 0 10*3/uL (ref 0.0–0.5)
Eosinophils Relative: 0 %
HCT: 30 % — ABNORMAL LOW (ref 39.0–52.0)
Hemoglobin: 9.5 g/dL — ABNORMAL LOW (ref 13.0–17.0)
Immature Granulocytes: 0 %
Lymphocytes Relative: 11 %
Lymphs Abs: 0.3 10*3/uL — ABNORMAL LOW (ref 0.7–4.0)
MCH: 30.5 pg (ref 26.0–34.0)
MCHC: 31.7 g/dL (ref 30.0–36.0)
MCV: 96.5 fL (ref 80.0–100.0)
Monocytes Absolute: 0.2 10*3/uL (ref 0.1–1.0)
Monocytes Relative: 7 %
Neutro Abs: 2.5 10*3/uL (ref 1.7–7.7)
Neutrophils Relative %: 82 %
Platelet Count: 72 10*3/uL — ABNORMAL LOW (ref 150–400)
RBC: 3.11 MIL/uL — ABNORMAL LOW (ref 4.22–5.81)
RDW: 17 % — ABNORMAL HIGH (ref 11.5–15.5)
WBC Count: 3 10*3/uL — ABNORMAL LOW (ref 4.0–10.5)
nRBC: 0 % (ref 0.0–0.2)

## 2019-12-26 LAB — CMP (CANCER CENTER ONLY)
ALT: 23 U/L (ref 0–44)
AST: 16 U/L (ref 15–41)
Albumin: 3.5 g/dL (ref 3.5–5.0)
Alkaline Phosphatase: 76 U/L (ref 38–126)
Anion gap: 9 (ref 5–15)
BUN: 9 mg/dL (ref 8–23)
CO2: 32 mmol/L (ref 22–32)
Calcium: 8.5 mg/dL — ABNORMAL LOW (ref 8.9–10.3)
Chloride: 103 mmol/L (ref 98–111)
Creatinine: 0.84 mg/dL (ref 0.61–1.24)
GFR, Est AFR Am: >60 mL/min (ref >60)
GFR, Estimated: >60 mL/min (ref >60)
Glucose, Bld: 136 mg/dL — ABNORMAL HIGH (ref 70–99)
Potassium: 3.5 mmol/L (ref 3.5–5.1)
Sodium: 144 mmol/L (ref 135–145)
Total Bilirubin: 0.3 mg/dL (ref 0.3–1.2)
Total Protein: 6.3 g/dL — ABNORMAL LOW (ref 6.5–8.1)

## 2019-12-26 MED ORDER — SODIUM CHLORIDE 0.9 % IV SOLN
Freq: Once | INTRAVENOUS | Status: AC
  Filled 2019-12-26: qty 250

## 2019-12-26 MED ORDER — SODIUM CHLORIDE 0.9 % IV SOLN
37.5000 mg/m2 | Freq: Once | INTRAVENOUS | Status: AC
  Administered 2019-12-26: 110 mg via INTRAVENOUS
  Filled 2019-12-26: qty 11

## 2019-12-26 MED ORDER — DEXAMETHASONE SODIUM PHOSPHATE 10 MG/ML IJ SOLN
10.0000 mg | Freq: Once | INTRAMUSCULAR | Status: AC
  Administered 2019-12-26: 10:00:00 10 mg via INTRAVENOUS

## 2019-12-26 MED ORDER — SODIUM CHLORIDE 0.9% FLUSH
10.0000 mL | INTRAVENOUS | Status: DC | PRN
  Administered 2019-12-26: 12:00:00 10 mL
  Filled 2019-12-26: qty 10

## 2019-12-26 MED ORDER — HEPARIN SOD (PORK) LOCK FLUSH 100 UNIT/ML IV SOLN
500.0000 [IU] | Freq: Once | INTRAVENOUS | Status: AC | PRN
  Administered 2019-12-26: 500 [IU]
  Filled 2019-12-26: qty 5

## 2019-12-26 MED ORDER — DEXAMETHASONE SODIUM PHOSPHATE 10 MG/ML IJ SOLN
INTRAMUSCULAR | Status: AC
  Filled 2019-12-26: qty 1

## 2019-12-26 NOTE — Telephone Encounter (Signed)
Scheduled appt per 2/10 los.

## 2019-12-26 NOTE — Progress Notes (Signed)
Hematology and Oncology Follow Up Visit  Gregory Schneider [id number] 04/12/1956 64 y.o. 12/26/2019 9:42 AM   Principle Diagnosis: 64 year old man with castration-resistant prostate cancer with disease to the bone diagnosed in 2013.  He presented in 2010 with Gleason score 4 + 5 = 9.  Prior Therapy:  Combined androgen deprivation with Lupron and Casodex. He had a good response and then developed a rise in the PSA with castrate level testosterone.  He is S/P Bilateral simple orchiectomy done on 12/25/2012.  Provenge started on 03/03/12. Therapy Ended in 03/31/2012. Case Zytiga started in 10/2013. Therapy discontinued in January 2018 because of progression of disease. Xofigo infusion to start on Apr 13, 2018.  He completed 6 months of therapy in November 2019. Status post prostate radiation between December 19, 2018 and January 01, 2019.  He received total of 30 Gy in 10 fractions to the prostate and the pubic rami. Xtandi 160 mg daily started in February 2018.  Therapy discontinued in October 2020 because of progression of disease.   Current therapy: Taxotere chemotherapy started on September 13, 2019.  He completed 3 cycles of therapy.  He is here for cycle 5 of therapy.      Interim History:  Gregory Schneider presents today for repeat evaluation.  Since the last visit, he tolerated the last cycle of chemotherapy with diffuse complaints but manageable at this time.  He reported nausea and fatigue that lasted for about a week but still able to eat well and gained more weight.  He does report neuropathy but is manageable with gabapentin at this time.  Performance status and quality of life currently maintained.      Medications: Updated on review. Current Outpatient Medications  Medication Sig Dispense Refill  . amLODipine (NORVASC) 2.5 MG tablet Take 2.5 mg by mouth daily.    Marland Kitchen buPROPion (ZYBAN) 150 MG 12 hr tablet Take 150 mg by mouth.    . calcium carbonate (OS-CAL) 600 MG TABS Take 600 mg by  mouth daily.    . cholecalciferol (VITAMIN D) 1000 UNITS tablet Take 1,000 Units by mouth daily.    . clonazePAM (KLONOPIN) 1 MG tablet Take 1 mg by mouth as needed for anxiety (1-2 tablets daily as needed).     . Cyanocobalamin (RA VITAMIN B-12 TR) 1000 MCG TBCR Take by mouth.    . gabapentin (NEURONTIN) 300 MG capsule Take 2 capsules (600 mg total) by mouth 2 (two) times daily. 120 capsule 2  . HYDROcodone-acetaminophen (NORCO/VICODIN) 5-325 MG tablet Take 1-2 tablets by mouth every 6 (six) hours as needed. 90 tablet 0  . lidocaine-prilocaine (EMLA) cream Apply 1 application topically as needed. 30 g 0  . losartan-hydrochlorothiazide (HYZAAR) 100-25 MG tablet   0  . Multiple Vitamin (MULTIVITAMIN) tablet Take 1 tablet by mouth daily.    . prochlorperazine (COMPAZINE) 10 MG tablet Take 1 tablet (10 mg total) by mouth every 6 (six) hours as needed for nausea or vomiting. 30 tablet 3  . sertraline (ZOLOFT) 100 MG tablet Take 200 mg by mouth every morning.      No current facility-administered medications for this visit.    Allergies:  Allergies  Allergen Reactions  . Penicillins Other (See Comments)    Patient stated,"I don't remember what kind of reaction because I was a kid."      Physical Exam:    Blood pressure (!) 146/80, pulse 72, temperature 97.8 F (36.6 C), temperature source Temporal, resp. rate 18, height 6\' 3"  (1.905 m),  weight (!) 351 lb 4.8 oz (159.3 kg), SpO2 97 %.         ECOG: 1   General appearance: Alert, awake without any distress. Head: Atraumatic without abnormalities Oropharynx: Without any thrush or ulcers. Eyes: No scleral icterus. Lymph nodes: No lymphadenopathy noted in the cervical, supraclavicular, or axillary nodes Heart:regular rate and rhythm, without any murmurs or gallops.   Lung: Clear to auscultation without any rhonchi, wheezes or dullness to percussion. Abdomin: Soft, nontender without any shifting dullness or  ascites. Musculoskeletal: No clubbing or cyanosis. Neurological: No motor or sensory deficits. Skin: No rashes or lesions.              Lab Results: Lab Results  Component Value Date   WBC 4.0 12/05/2019   HGB 10.8 (L) 12/05/2019   HCT 33.8 (L) 12/05/2019   MCV 96.6 12/05/2019   PLT 100 (L) 12/05/2019     Chemistry      Component Value Date/Time   NA 140 12/05/2019 1130   NA 136 11/17/2017 0956   K 3.8 12/05/2019 1130   K 3.8 11/17/2017 0956   CL 103 12/05/2019 1130   CL 104 03/09/2013 0921   CO2 26 12/05/2019 1130   CO2 28 11/17/2017 0956   BUN 14 12/05/2019 1130   BUN 19.7 11/17/2017 0956   CREATININE 0.85 12/05/2019 1130   CREATININE 0.9 11/17/2017 0956      Component Value Date/Time   CALCIUM 8.4 (L) 12/05/2019 1130   CALCIUM 9.5 11/17/2017 0956   ALKPHOS 111 12/05/2019 1130   ALKPHOS 89 11/17/2017 0956   AST 16 12/05/2019 1130   AST 21 11/17/2017 0956   ALT 24 12/05/2019 1130   ALT 47 11/17/2017 0956   BILITOT 0.4 12/05/2019 1130   BILITOT 0.39 11/17/2017 0956       Results for Gregory Schneider, Gregory Schneider (MRN [id number]) as of 12/26/2019 10:07  Ref. Range 04/18/2019 10:18 06/27/2019 09:04 08/28/2019 09:13 09/13/2019 12:02 10/02/2019 08:34 10/24/2019 09:02 11/14/2019 10:15 12/05/2019 11:30  Prostate Specific Ag, Serum Latest Ref Range: 0.0 - 4.0 ng/mL 16.4 (H) 45.8 (H) 89.8 (H) 123.0 (H) 135.0 (H) 82.3 (H) 60.3 (H) 85.9 (H)           Impression and Plan:    64 year old man with  1.  Castration-resistant prostate cancer with disease to the bone since 2013.    He has tolerated Taxotere chemotherapy with dose reduction better than previously.  Risks and benefits of continuing therapy were reviewed today.  The potential advantage of this treatment was discussed including controlling his disease status and palliating his symptoms long-term.  Given his cytopenias associated with higher doses of Taxotere, will continue dose reduction and give him a total of 50% of  the Taxotere dose.  He is agreeable to proceed at this time.  The plan is to complete 10 cycles if possible.  Alternative options in the future were discussed including different chemotherapy regimen using Jevtana, clinical trials as well as possibly PARP inhibitors if he harbors the appropriate mutation.   2. Androgen deprivation: No additional androgen deprivation is needed at this time.  He has castrate level testosterone.  3.  Bone directed therapy: He has been on Zometa in the past and has deferred any additional treatments.  He is currently on calcium vitamin D supplements.   4.  Prognosis and goals of care: he understands that therapy is palliative and any treatment will not be curative at this time.  Aggressive measures are warranted given his  reasonable performance status.  5.  Thrombocytopenia: Slightly declined but with no bleeding.  I recommended proceeding with Taxotere chemotherapy today with bleeding precautions.  I anticipate improvement in his platelets with lower dose of Taxotere.  6.  Bone pain: He is currently on hydrocodone which I have refilled for him.  7.  IV access: Port-A-Cath remains in use without any issues.  8.  Antiemetics: Compazine is available to him and managing his nausea reasonably well.  9.  Growth factor support: He is at risk of neutropenia and sepsis and will receive growth factor support after each cycle.  10.  Neuropathy: I increase his Neurontin dose to 900 mg twice a day which is managing his neuropathy reasonably well.  11. Follow up: In 3 weeks for the next cycle of therapy.   40 minutes was dedicated to this visit today.  The time was spent on reviewing his laboratory data, reviewing disease status, addressing disease complications and future plan of care including long-term prognosis and alternative options.    Zola Button MD 2/10/20219:42 AM

## 2019-12-26 NOTE — Patient Instructions (Signed)
[facility] Discharge Instructions for Patients Receiving Chemotherapy  Today you received the following chemotherapy agents Docetaxel (TAXOTERE).  To help prevent nausea and vomiting after your treatment, we encourage you to take your nausea medication as prescribed.  If you develop nausea and vomiting that is not controlled by your nausea medication, call the clinic.   BELOW ARE SYMPTOMS THAT SHOULD BE REPORTED IMMEDIATELY:  *FEVER GREATER THAN 100.5 F  *CHILLS WITH OR WITHOUT FEVER  NAUSEA AND VOMITING THAT IS NOT CONTROLLED WITH YOUR NAUSEA MEDICATION  *UNUSUAL SHORTNESS OF BREATH  *UNUSUAL BRUISING OR BLEEDING  TENDERNESS IN MOUTH AND THROAT WITH OR WITHOUT PRESENCE OF ULCERS  *URINARY PROBLEMS  *BOWEL PROBLEMS  UNUSUAL RASH Items with * indicate a potential emergency and should be followed up as soon as possible.  Feel free to call the clinic should you have any questions or concerns. The clinic phone number is [phone].  Please show the [facility] at check-in to the Emergency Department and triage nurse.  Coronavirus (COVID-19) Are you at risk?  Are you at risk for the Coronavirus (COVID-19)?  To be considered HIGH RISK for Coronavirus (COVID-19), you have to meet the following criteria:  . Traveled to [place], [place], [place], [place] or [place]; or in the [place] to [place], [place], [place], or [place]; and have fever, cough, and shortness of breath within the last 2 weeks of travel OR . Been in close contact with a person diagnosed with COVID-19 within the last 2 weeks and have fever, cough, and shortness of breath . IF YOU DO NOT MEET THESE CRITERIA, YOU ARE CONSIDERED LOW RISK FOR COVID-19.  What to do if you are HIGH RISK for COVID-19?  Marland Kitchen If you are having a medical emergency, call 911. . Seek medical care right away. Before you go to a doctor's office, urgent care or emergency department, call ahead and tell them  about your recent travel, contact with someone diagnosed with COVID-19, and your symptoms. You should receive instructions from your physician's office regarding next steps of care.  . When you arrive at healthcare provider, tell the healthcare staff immediately you have returned from visiting [place], [place], [place], [place] or [place]; or traveled in the [place] to [place], [place], [place], or [place]; in the last two weeks or you have been in close contact with a person diagnosed with COVID-19 in the last 2 weeks.   . Tell the health care staff about your symptoms: fever, cough and shortness of breath. . After you have been seen by a medical provider, you will be either: o Tested for (COVID-19) and discharged home on quarantine except to seek medical care if symptoms worsen, and asked to  - Stay home and avoid contact with others until you get your results (4-5 days)  - Avoid travel on public transportation if possible (such as bus, train, or airplane) or o Sent to the Emergency Department by EMS for evaluation, COVID-19 testing, and possible admission depending on your condition and test results.  What to do if you are LOW RISK for COVID-19?  Reduce your risk of any infection by using the same precautions used for avoiding the common cold or flu:  Marland Kitchen Wash your hands often with soap and warm water for at least 20 seconds.  If soap and water are not readily available, use an alcohol-based hand sanitizer with at least 60% alcohol.  . If coughing or sneezing,  cover your mouth and nose by coughing or sneezing into the elbow areas of your shirt or coat, into a tissue or into your sleeve (not your hands). . Avoid shaking hands with others and consider head nods or verbal greetings only. . Avoid touching your eyes, nose, or mouth with unwashed hands.  . Avoid close contact with people who are sick. . Avoid places or events with large numbers of people in one location, like concerts or  sporting events. . Carefully consider travel plans you have or are making. . If you are planning any travel outside or inside the [place], visit the CDC's Travelers' Health webpage for the latest health notices. . If you have some symptoms but not all symptoms, continue to monitor at home and seek medical attention if your symptoms worsen. . If you are having a medical emergency, call 911.   [place] / e-Visit: [email or web address]         [facility]: [facility]: [phone]                   [facility]: [phone]

## 2019-12-27 ENCOUNTER — Telehealth: Payer: Self-pay

## 2019-12-27 LAB — PROSTATE-SPECIFIC AG, SERUM (LABCORP): Prostate Specific Ag, Serum: 88.6 ng/mL — ABNORMAL HIGH (ref 0.0–4.0)

## 2019-12-27 NOTE — Telephone Encounter (Signed)
-----   Message from Wyatt Portela, MD sent at 12/27/2019  2:16 PM EST ----- Regarding: RE: PSA results Yes.  Minimum changes noted in his PSA.  Thanks. ----- Message ----- From: Scot Dock, RN Sent: 12/27/2019   1:58 PM EST To: Wyatt Portela, MD Subject: PSA results                                    He would like to know his PSA results. Can I share?

## 2019-12-27 NOTE — Telephone Encounter (Signed)
Contacted patient and made aware of PSA results. 

## 2019-12-28 ENCOUNTER — Inpatient Hospital Stay: Payer: Medicare HMO

## 2019-12-28 ENCOUNTER — Other Ambulatory Visit: Payer: Self-pay

## 2019-12-28 VITALS — BP 142/72 | HR 72 | Temp 98.2°F | Resp 18

## 2019-12-28 DIAGNOSIS — C7951 Secondary malignant neoplasm of bone: Secondary | ICD-10-CM

## 2019-12-28 DIAGNOSIS — C61 Malignant neoplasm of prostate: Secondary | ICD-10-CM

## 2019-12-28 DIAGNOSIS — Z79899 Other long term (current) drug therapy: Secondary | ICD-10-CM | POA: Diagnosis not present

## 2019-12-28 DIAGNOSIS — D696 Thrombocytopenia, unspecified: Secondary | ICD-10-CM | POA: Diagnosis not present

## 2019-12-28 DIAGNOSIS — G629 Polyneuropathy, unspecified: Secondary | ICD-10-CM | POA: Diagnosis not present

## 2019-12-28 DIAGNOSIS — Z5111 Encounter for antineoplastic chemotherapy: Secondary | ICD-10-CM | POA: Diagnosis not present

## 2019-12-28 MED ORDER — PEGFILGRASTIM-CBQV 6 MG/0.6ML ~~LOC~~ SOSY
PREFILLED_SYRINGE | SUBCUTANEOUS | Status: AC
  Filled 2019-12-28: qty 0.6

## 2019-12-28 MED ORDER — PEGFILGRASTIM-CBQV 6 MG/0.6ML ~~LOC~~ SOSY
6.0000 mg | PREFILLED_SYRINGE | Freq: Once | SUBCUTANEOUS | Status: AC
  Administered 2019-12-28: 6 mg via SUBCUTANEOUS

## 2019-12-28 NOTE — Patient Instructions (Signed)

## 2020-01-08 ENCOUNTER — Other Ambulatory Visit: Payer: Self-pay | Admitting: Oncology

## 2020-01-08 DIAGNOSIS — C61 Malignant neoplasm of prostate: Secondary | ICD-10-CM

## 2020-01-08 DIAGNOSIS — C7951 Secondary malignant neoplasm of bone: Secondary | ICD-10-CM

## 2020-01-08 MED ORDER — HYDROCODONE-ACETAMINOPHEN 5-325 MG PO TABS: 1.0000 | ORAL_TABLET | Freq: Four times a day (QID) | ORAL | 0 refills | Status: DC | PRN

## 2020-01-16 ENCOUNTER — Inpatient Hospital Stay: Payer: Medicare HMO

## 2020-01-16 ENCOUNTER — Telehealth: Payer: Self-pay | Admitting: Oncology

## 2020-01-16 ENCOUNTER — Inpatient Hospital Stay: Payer: Medicare HMO | Attending: Oncology

## 2020-01-16 ENCOUNTER — Inpatient Hospital Stay (HOSPITAL_BASED_OUTPATIENT_CLINIC_OR_DEPARTMENT_OTHER): Payer: Medicare HMO | Admitting: Oncology

## 2020-01-16 ENCOUNTER — Other Ambulatory Visit: Payer: Self-pay

## 2020-01-16 VITALS — BP 165/94 | HR 80 | Temp 97.8°F | Resp 18 | Ht 75.0 in | Wt 342.5 lb

## 2020-01-16 DIAGNOSIS — R350 Frequency of micturition: Secondary | ICD-10-CM | POA: Diagnosis not present

## 2020-01-16 DIAGNOSIS — Z5189 Encounter for other specified aftercare: Secondary | ICD-10-CM | POA: Diagnosis not present

## 2020-01-16 DIAGNOSIS — Z5111 Encounter for antineoplastic chemotherapy: Secondary | ICD-10-CM | POA: Diagnosis not present

## 2020-01-16 DIAGNOSIS — R634 Abnormal weight loss: Secondary | ICD-10-CM | POA: Insufficient documentation

## 2020-01-16 DIAGNOSIS — Z95828 Presence of other vascular implants and grafts: Secondary | ICD-10-CM

## 2020-01-16 DIAGNOSIS — D6959 Other secondary thrombocytopenia: Secondary | ICD-10-CM | POA: Insufficient documentation

## 2020-01-16 DIAGNOSIS — G893 Neoplasm related pain (acute) (chronic): Secondary | ICD-10-CM | POA: Diagnosis not present

## 2020-01-16 DIAGNOSIS — C7951 Secondary malignant neoplasm of bone: Secondary | ICD-10-CM | POA: Diagnosis not present

## 2020-01-16 DIAGNOSIS — G629 Polyneuropathy, unspecified: Secondary | ICD-10-CM | POA: Diagnosis not present

## 2020-01-16 DIAGNOSIS — R3915 Urgency of urination: Secondary | ICD-10-CM | POA: Diagnosis not present

## 2020-01-16 DIAGNOSIS — C61 Malignant neoplasm of prostate: Secondary | ICD-10-CM

## 2020-01-16 DIAGNOSIS — Z79899 Other long term (current) drug therapy: Secondary | ICD-10-CM | POA: Diagnosis not present

## 2020-01-16 LAB — CBC WITH DIFFERENTIAL (CANCER CENTER ONLY)
Abs Immature Granulocytes: 0.02 10*3/uL (ref 0.00–0.07)
Basophils Absolute: 0 10*3/uL (ref 0.0–0.1)
Basophils Relative: 0 %
Eosinophils Absolute: 0 10*3/uL (ref 0.0–0.5)
Eosinophils Relative: 0 %
HCT: 30.1 % — ABNORMAL LOW (ref 39.0–52.0)
Hemoglobin: 9.8 g/dL — ABNORMAL LOW (ref 13.0–17.0)
Immature Granulocytes: 1 %
Lymphocytes Relative: 7 %
Lymphs Abs: 0.3 10*3/uL — ABNORMAL LOW (ref 0.7–4.0)
MCH: 31 pg (ref 26.0–34.0)
MCHC: 32.6 g/dL (ref 30.0–36.0)
MCV: 95.3 fL (ref 80.0–100.0)
Monocytes Absolute: 0.3 10*3/uL (ref 0.1–1.0)
Monocytes Relative: 7 %
Neutro Abs: 3.5 10*3/uL (ref 1.7–7.7)
Neutrophils Relative %: 85 %
Platelet Count: 72 10*3/uL — ABNORMAL LOW (ref 150–400)
RBC: 3.16 MIL/uL — ABNORMAL LOW (ref 4.22–5.81)
RDW: 17.8 % — ABNORMAL HIGH (ref 11.5–15.5)
WBC Count: 4.1 10*3/uL (ref 4.0–10.5)
nRBC: 0 % (ref 0.0–0.2)

## 2020-01-16 LAB — CMP (CANCER CENTER ONLY)
ALT: 25 U/L (ref 0–44)
AST: 16 U/L (ref 15–41)
Albumin: 3.8 g/dL (ref 3.5–5.0)
Alkaline Phosphatase: 84 U/L (ref 38–126)
Anion gap: 8 (ref 5–15)
BUN: 13 mg/dL (ref 8–23)
CO2: 30 mmol/L (ref 22–32)
Calcium: 8.8 mg/dL — ABNORMAL LOW (ref 8.9–10.3)
Chloride: 103 mmol/L (ref 98–111)
Creatinine: 0.85 mg/dL (ref 0.61–1.24)
GFR, Est AFR Am: >60 mL/min (ref >60)
GFR, Estimated: >60 mL/min (ref >60)
Glucose, Bld: 128 mg/dL — ABNORMAL HIGH (ref 70–99)
Potassium: 3.1 mmol/L — ABNORMAL LOW (ref 3.5–5.1)
Sodium: 141 mmol/L (ref 135–145)
Total Bilirubin: 0.6 mg/dL (ref 0.3–1.2)
Total Protein: 6.5 g/dL (ref 6.5–8.1)

## 2020-01-16 MED ORDER — DEXAMETHASONE SODIUM PHOSPHATE 10 MG/ML IJ SOLN
INTRAMUSCULAR | Status: AC
  Filled 2020-01-16: qty 1

## 2020-01-16 MED ORDER — HEPARIN SOD (PORK) LOCK FLUSH 100 UNIT/ML IV SOLN
500.0000 [IU] | Freq: Once | INTRAVENOUS | Status: AC | PRN
  Administered 2020-01-16: 500 [IU]
  Filled 2020-01-16: qty 5

## 2020-01-16 MED ORDER — SODIUM CHLORIDE 0.9% FLUSH
10.0000 mL | INTRAVENOUS | Status: DC | PRN
  Administered 2020-01-16: 10 mL via INTRAVENOUS
  Filled 2020-01-16: qty 10

## 2020-01-16 MED ORDER — DEXAMETHASONE SODIUM PHOSPHATE 10 MG/ML IJ SOLN
10.0000 mg | Freq: Once | INTRAMUSCULAR | Status: AC
  Administered 2020-01-16: 10 mg via INTRAVENOUS

## 2020-01-16 MED ORDER — GABAPENTIN 300 MG PO CAPS: 900.0000 mg | ORAL_CAPSULE | Freq: Two times a day (BID) | ORAL | 3 refills | Status: DC

## 2020-01-16 MED ORDER — SODIUM CHLORIDE 0.9% FLUSH
10.0000 mL | INTRAVENOUS | Status: DC | PRN
  Administered 2020-01-16: 10 mL
  Filled 2020-01-16: qty 10

## 2020-01-16 MED ORDER — SODIUM CHLORIDE 0.9 % IV SOLN
Freq: Once | INTRAVENOUS | Status: AC
  Filled 2020-01-16: qty 250

## 2020-01-16 MED ORDER — SODIUM CHLORIDE 0.9 % IV SOLN
37.5000 mg/m2 | Freq: Once | INTRAVENOUS | Status: AC
  Administered 2020-01-16: 110 mg via INTRAVENOUS
  Filled 2020-01-16: qty 11

## 2020-01-16 NOTE — Patient Instructions (Signed)
[facility] Discharge Instructions for Patients Receiving Chemotherapy  Today you received the following chemotherapy agents: Taxotere  To help prevent nausea and vomiting after your treatment, we encourage you to take your nausea medication as directed.    If you develop nausea and vomiting that is not controlled by your nausea medication, call the clinic.   BELOW ARE SYMPTOMS THAT SHOULD BE REPORTED IMMEDIATELY:  *FEVER GREATER THAN 100.5 F  *CHILLS WITH OR WITHOUT FEVER  NAUSEA AND VOMITING THAT IS NOT CONTROLLED WITH YOUR NAUSEA MEDICATION  *UNUSUAL SHORTNESS OF BREATH  *UNUSUAL BRUISING OR BLEEDING  TENDERNESS IN MOUTH AND THROAT WITH OR WITHOUT PRESENCE OF ULCERS  *URINARY PROBLEMS  *BOWEL PROBLEMS  UNUSUAL RASH Items with * indicate a potential emergency and should be followed up as soon as possible.  Feel free to call the clinic should you have any questions or concerns. The clinic phone number is [phone].  Please show the CHEMO ALERT CARD at check-in to the Emergency Department and triage nurse.   

## 2020-01-16 NOTE — Telephone Encounter (Signed)
Scheduled appt per 3/3 los

## 2020-01-16 NOTE — Progress Notes (Signed)
Hematology and Oncology Follow Up Visit  Gregory Schneider [id number] 12-Nov-1956 64 y.o. 01/16/2020 9:11 AM   Principle Diagnosis: 64 year old man with advanced prostate cancer with disease to the bone diagnosed in 2013.  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 Prior Therapy:  Combined androgen deprivation with Lupron and Casodex. He had a good response and then developed a rise in the PSA with castrate level testosterone.  He is S/P Bilateral simple orchiectomy done on 12/25/2012.  Provenge started on 03/03/12. Therapy Ended in 03/31/2012. Case Zytiga started in 10/2013. Therapy discontinued in January 2018 because of progression of disease. Xofigo infusion to start on Apr 13, 2018.  He completed 6 months of therapy in November 2019. Status post prostate radiation between December 19, 2018 and January 01, 2019.  He received total of 30 Gy in 10 fractions to the prostate and the pubic rami. Xtandi 160 mg daily started in February 2018.  Therapy discontinued in October 2020 because of progression of disease.   Current therapy: Taxotere chemotherapy started on September 13, 2019.  He completed 3 cycles of therapy.  He is here for cycle 6 of therapy.      Interim History:  Gregory Schneider returns today for a follow-up visit.  Since the last visit, he tolerated the last cycle of chemotherapy without any major complaints.  He denies any nausea, vomiting or abdominal pain.  He denies any worsening neuropathy at this time.  He continues to take gabapentin at 900 mg twice a day without any worsening complications.  His appetite is down and a loss some weight.      Medications: Without any changes on review. Current Outpatient Medications  Medication Sig Dispense Refill  . amLODipine (NORVASC) 2.5 MG tablet Take 2.5 mg by mouth daily.    Marland Kitchen buPROPion (ZYBAN) 150 MG 12 hr tablet Take 150 mg by mouth.    . calcium carbonate (OS-CAL) 600 MG TABS Take 600 mg by  mouth daily.    . cholecalciferol (VITAMIN D) 1000 UNITS tablet Take 1,000 Units by mouth daily.    . clonazePAM (KLONOPIN) 1 MG tablet Take 1 mg by mouth as needed for anxiety (1-2 tablets daily as needed).     . Cyanocobalamin (RA VITAMIN B-12 TR) 1000 MCG TBCR Take by mouth.    . gabapentin (NEURONTIN) 300 MG capsule Take 3 capsules (900 mg total) by mouth 2 (two) times daily. 180 capsule 3  . HYDROcodone-acetaminophen (NORCO/VICODIN) 5-325 MG tablet Take 1-2 tablets by mouth every 6 (six) hours as needed. 90 tablet 0  . lidocaine-prilocaine (EMLA) cream Apply 1 application topically as needed. 30 g 0  . losartan-hydrochlorothiazide (HYZAAR) 100-25 MG tablet   0  . Multiple Vitamin (MULTIVITAMIN) tablet Take 1 tablet by mouth daily.    . prochlorperazine (COMPAZINE) 10 MG tablet Take 1 tablet (10 mg total) by mouth every 6 (six) hours as needed for nausea or vomiting. 30 tablet 3  . sertraline (ZOLOFT) 100 MG tablet Take 200 mg by mouth every morning.      No current facility-administered medications for this visit.    Allergies:  Allergies  Allergen Reactions  . Penicillins Other (See Comments)    Patient stated,"I don't remember what kind of reaction because I was a kid."      Physical Exam:    Blood pressure (!) 165/94, pulse 80, temperature 97.8 F (36.6 C), temperature source Temporal, resp. rate 18, height 6\' 3"  (  1.905 m), weight (!) 342 lb 8 oz (155.4 kg), SpO2 96 %.         ECOG: 1     General appearance: Comfortable appearing without any discomfort Head: Normocephalic without any trauma Oropharynx: Mucous membranes are moist and pink without any thrush or ulcers. Eyes: Pupils are equal and round reactive to light. Lymph nodes: No cervical, supraclavicular, inguinal or axillary lymphadenopathy.   Heart:regular rate and rhythm.  S1 and S2 without leg edema. Lung: Clear without any rhonchi or wheezes.  No dullness to percussion. Abdomin: Soft, nontender,  nondistended with good bowel sounds.  No hepatosplenomegaly. Musculoskeletal: No joint deformity or effusion.  Full range of motion noted. Neurological: No deficits noted on motor, sensory and deep tendon reflex exam. Skin: No petechial rash or dryness.  Appeared moist.                Lab Results: Lab Results  Component Value Date   WBC 4.1 01/16/2020   HGB 9.8 (L) 01/16/2020   HCT 30.1 (L) 01/16/2020   MCV 95.3 01/16/2020   PLT 72 (L) 01/16/2020     Chemistry      Component Value Date/Time   NA 144 12/26/2019 0942   NA 136 11/17/2017 0956   K 3.5 12/26/2019 0942   K 3.8 11/17/2017 0956   CL 103 12/26/2019 0942   CL 104 03/09/2013 0921   CO2 32 12/26/2019 0942   CO2 28 11/17/2017 0956   BUN 9 12/26/2019 0942   BUN 19.7 11/17/2017 0956   CREATININE 0.84 12/26/2019 0942   CREATININE 0.9 11/17/2017 0956      Component Value Date/Time   CALCIUM 8.5 (L) 12/26/2019 0942   CALCIUM 9.5 11/17/2017 0956   ALKPHOS 76 12/26/2019 0942   ALKPHOS 89 11/17/2017 0956   AST 16 12/26/2019 0942   AST 21 11/17/2017 0956   ALT 23 12/26/2019 0942   ALT 47 11/17/2017 0956   BILITOT 0.3 12/26/2019 0942   BILITOT 0.39 11/17/2017 0956         Results for Gregory Schneider, Gregory Schneider (MRN [id number]) as of 01/16/2020 09:01  Ref. Range 12/05/2019 11:30 12/26/2019 09:42  Prostate Specific Ag, Serum Latest Ref Range: 0.0 - 4.0 ng/mL 85.9 (H) 88.6 (H)        Impression and Plan:    64 year old man with  1.  Advanced prostate cancer with disease to the bone diagnosed in 2013.  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  He continues to be on Taxotere with a reasonable tolerance and palliation of his symptoms.  He reports his pelvic discomfort as well as urinary frequency and urgency has improved dramatically since the start of chemotherapy.  His PSA is showing some reasonable benefit as well.  Risks and benefits of continuing this therapy were reviewed and I have recommended continuing  same dose and schedule.   2. Androgen deprivation: He has castrate level testosterone without any additional androgen deprivation at this time.  3.  Bone directed therapy: He is on calcium and vitamin D supplements and deferred further Zometa infusion.   4.  Prognosis and goals of care: Therapy remains palliative although aggressive measures are warranted given his reasonable performance status.  5.  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  We will continue with the same dose and schedule of Taxotere as long as her platelet count not below 70,000.  6.  Bone pain: Manageable with hydrocodone which was refilled for him.  7.  IV access: Port-A-Cath currently  in place without any issues.  8.  Antiemetics: No nausea or vomiting reported Compazine is available to him.  9.  Growth factor support: He will receive growth factor support after each cycle given his risk of neutropenia and sepsis.  10.  Neuropathy: He is currently at 900 mg twice a day with manageable symptoms.  11. Follow up: He will return in 3 weeks for repeat evaluation.   30 minutes were spent on this encounter.  Time was spent on reviewing laboratory data, assessing treatment options as well as complications (therapy.   Zola Button MD 3/3/20219:11 AM

## 2020-01-17 ENCOUNTER — Telehealth: Payer: Self-pay

## 2020-01-17 LAB — PROSTATE-SPECIFIC AG, SERUM (LABCORP): Prostate Specific Ag, Serum: 90.3 ng/mL — ABNORMAL HIGH (ref 0.0–4.0)

## 2020-01-17 NOTE — Telephone Encounter (Signed)
-----   Message from Wyatt Portela, MD sent at 01/17/2020  8:10 AM EST ----- Please let him know his PSA is up slightly. No changes for now.

## 2020-01-17 NOTE — Telephone Encounter (Signed)
Called and informed patient of PSA level. Patient verbalized understanding. Patient instructed to call office with any questions or concerns.

## 2020-01-18 ENCOUNTER — Inpatient Hospital Stay: Payer: Medicare HMO

## 2020-01-18 ENCOUNTER — Other Ambulatory Visit: Payer: Self-pay

## 2020-01-18 VITALS — BP 160/77 | HR 78 | Temp 98.2°F | Resp 20

## 2020-01-18 DIAGNOSIS — R350 Frequency of micturition: Secondary | ICD-10-CM | POA: Diagnosis not present

## 2020-01-18 DIAGNOSIS — C61 Malignant neoplasm of prostate: Secondary | ICD-10-CM | POA: Diagnosis not present

## 2020-01-18 DIAGNOSIS — D6959 Other secondary thrombocytopenia: Secondary | ICD-10-CM | POA: Diagnosis not present

## 2020-01-18 DIAGNOSIS — C7951 Secondary malignant neoplasm of bone: Secondary | ICD-10-CM | POA: Diagnosis not present

## 2020-01-18 DIAGNOSIS — G629 Polyneuropathy, unspecified: Secondary | ICD-10-CM | POA: Diagnosis not present

## 2020-01-18 DIAGNOSIS — Z5189 Encounter for other specified aftercare: Secondary | ICD-10-CM | POA: Diagnosis not present

## 2020-01-18 DIAGNOSIS — R3915 Urgency of urination: Secondary | ICD-10-CM | POA: Diagnosis not present

## 2020-01-18 DIAGNOSIS — Z5111 Encounter for antineoplastic chemotherapy: Secondary | ICD-10-CM | POA: Diagnosis not present

## 2020-01-18 DIAGNOSIS — G893 Neoplasm related pain (acute) (chronic): Secondary | ICD-10-CM | POA: Diagnosis not present

## 2020-01-18 DIAGNOSIS — R634 Abnormal weight loss: Secondary | ICD-10-CM | POA: Diagnosis not present

## 2020-01-18 MED ORDER — PEGFILGRASTIM-CBQV 6 MG/0.6ML ~~LOC~~ SOSY
PREFILLED_SYRINGE | SUBCUTANEOUS | Status: AC
  Filled 2020-01-18: qty 0.6

## 2020-01-18 MED ORDER — PEGFILGRASTIM-CBQV 6 MG/0.6ML ~~LOC~~ SOSY
6.0000 mg | PREFILLED_SYRINGE | Freq: Once | SUBCUTANEOUS | Status: AC
  Administered 2020-01-18: 10:00:00 6 mg via SUBCUTANEOUS

## 2020-01-18 NOTE — Patient Instructions (Signed)

## 2020-02-06 ENCOUNTER — Inpatient Hospital Stay: Payer: Medicare HMO

## 2020-02-06 ENCOUNTER — Other Ambulatory Visit: Payer: Self-pay

## 2020-02-06 ENCOUNTER — Inpatient Hospital Stay (HOSPITAL_BASED_OUTPATIENT_CLINIC_OR_DEPARTMENT_OTHER): Payer: Medicare HMO | Admitting: Oncology

## 2020-02-06 VITALS — BP 159/83 | HR 72 | Temp 98.7°F | Resp 18 | Ht 75.0 in | Wt 337.5 lb

## 2020-02-06 DIAGNOSIS — Z5111 Encounter for antineoplastic chemotherapy: Secondary | ICD-10-CM | POA: Diagnosis not present

## 2020-02-06 DIAGNOSIS — C61 Malignant neoplasm of prostate: Secondary | ICD-10-CM

## 2020-02-06 DIAGNOSIS — C7951 Secondary malignant neoplasm of bone: Secondary | ICD-10-CM

## 2020-02-06 DIAGNOSIS — R634 Abnormal weight loss: Secondary | ICD-10-CM | POA: Diagnosis not present

## 2020-02-06 DIAGNOSIS — D6959 Other secondary thrombocytopenia: Secondary | ICD-10-CM | POA: Diagnosis not present

## 2020-02-06 DIAGNOSIS — Z5189 Encounter for other specified aftercare: Secondary | ICD-10-CM | POA: Diagnosis not present

## 2020-02-06 DIAGNOSIS — R350 Frequency of micturition: Secondary | ICD-10-CM | POA: Diagnosis not present

## 2020-02-06 DIAGNOSIS — G893 Neoplasm related pain (acute) (chronic): Secondary | ICD-10-CM | POA: Diagnosis not present

## 2020-02-06 DIAGNOSIS — Z95828 Presence of other vascular implants and grafts: Secondary | ICD-10-CM

## 2020-02-06 DIAGNOSIS — G629 Polyneuropathy, unspecified: Secondary | ICD-10-CM | POA: Diagnosis not present

## 2020-02-06 DIAGNOSIS — R3915 Urgency of urination: Secondary | ICD-10-CM | POA: Diagnosis not present

## 2020-02-06 LAB — CMP (CANCER CENTER ONLY)
ALT: 25 U/L (ref 0–44)
AST: 17 U/L (ref 15–41)
Albumin: 3.7 g/dL (ref 3.5–5.0)
Alkaline Phosphatase: 90 U/L (ref 38–126)
Anion gap: 7 (ref 5–15)
BUN: 10 mg/dL (ref 8–23)
CO2: 30 mmol/L (ref 22–32)
Calcium: 8.7 mg/dL — ABNORMAL LOW (ref 8.9–10.3)
Chloride: 103 mmol/L (ref 98–111)
Creatinine: 0.82 mg/dL (ref 0.61–1.24)
GFR, Est AFR Am: >60 mL/min (ref >60)
GFR, Estimated: >60 mL/min (ref >60)
Glucose, Bld: 118 mg/dL — ABNORMAL HIGH (ref 70–99)
Potassium: 3.5 mmol/L (ref 3.5–5.1)
Sodium: 140 mmol/L (ref 135–145)
Total Bilirubin: 0.5 mg/dL (ref 0.3–1.2)
Total Protein: 6.5 g/dL (ref 6.5–8.1)

## 2020-02-06 LAB — CBC WITH DIFFERENTIAL (CANCER CENTER ONLY)
Abs Immature Granulocytes: 0.01 10*3/uL (ref 0.00–0.07)
Basophils Absolute: 0 10*3/uL (ref 0.0–0.1)
Basophils Relative: 0 %
Eosinophils Absolute: 0 10*3/uL (ref 0.0–0.5)
Eosinophils Relative: 0 %
HCT: 29.2 % — ABNORMAL LOW (ref 39.0–52.0)
Hemoglobin: 9.3 g/dL — ABNORMAL LOW (ref 13.0–17.0)
Immature Granulocytes: 0 %
Lymphocytes Relative: 8 %
Lymphs Abs: 0.3 10*3/uL — ABNORMAL LOW (ref 0.7–4.0)
MCH: 30.5 pg (ref 26.0–34.0)
MCHC: 31.8 g/dL (ref 30.0–36.0)
MCV: 95.7 fL (ref 80.0–100.0)
Monocytes Absolute: 0.2 10*3/uL (ref 0.1–1.0)
Monocytes Relative: 5 %
Neutro Abs: 3.1 10*3/uL (ref 1.7–7.7)
Neutrophils Relative %: 87 %
Platelet Count: 79 10*3/uL — ABNORMAL LOW (ref 150–400)
RBC: 3.05 MIL/uL — ABNORMAL LOW (ref 4.22–5.81)
RDW: 18.3 % — ABNORMAL HIGH (ref 11.5–15.5)
WBC Count: 3.7 10*3/uL — ABNORMAL LOW (ref 4.0–10.5)
nRBC: 0 % (ref 0.0–0.2)

## 2020-02-06 MED ORDER — SODIUM CHLORIDE 0.9 % IV SOLN
Freq: Once | INTRAVENOUS | Status: AC
  Filled 2020-02-06: qty 250

## 2020-02-06 MED ORDER — HEPARIN SOD (PORK) LOCK FLUSH 100 UNIT/ML IV SOLN
500.0000 [IU] | Freq: Once | INTRAVENOUS | Status: AC | PRN
  Administered 2020-02-06: 500 [IU]
  Filled 2020-02-06: qty 5

## 2020-02-06 MED ORDER — SODIUM CHLORIDE 0.9% FLUSH
10.0000 mL | INTRAVENOUS | Status: DC | PRN
  Administered 2020-02-06: 10 mL
  Filled 2020-02-06: qty 10

## 2020-02-06 MED ORDER — DEXAMETHASONE SODIUM PHOSPHATE 10 MG/ML IJ SOLN
INTRAMUSCULAR | Status: AC
  Filled 2020-02-06: qty 1

## 2020-02-06 MED ORDER — DEXAMETHASONE SODIUM PHOSPHATE 10 MG/ML IJ SOLN
10.0000 mg | Freq: Once | INTRAMUSCULAR | Status: AC
  Administered 2020-02-06: 10 mg via INTRAVENOUS

## 2020-02-06 MED ORDER — SODIUM CHLORIDE 0.9 % IV SOLN
37.5000 mg/m2 | Freq: Once | INTRAVENOUS | Status: AC
  Administered 2020-02-06: 110 mg via INTRAVENOUS
  Filled 2020-02-06: qty 11

## 2020-02-06 NOTE — Patient Instructions (Signed)
[facility] Discharge Instructions for Patients Receiving Chemotherapy  Today you received the following chemotherapy agents: Taxotere  To help prevent nausea and vomiting after your treatment, we encourage you to take your nausea medication as directed.    If you develop nausea and vomiting that is not controlled by your nausea medication, call the clinic.   BELOW ARE SYMPTOMS THAT SHOULD BE REPORTED IMMEDIATELY:  *FEVER GREATER THAN 100.5 F  *CHILLS WITH OR WITHOUT FEVER  NAUSEA AND VOMITING THAT IS NOT CONTROLLED WITH YOUR NAUSEA MEDICATION  *UNUSUAL SHORTNESS OF BREATH  *UNUSUAL BRUISING OR BLEEDING  TENDERNESS IN MOUTH AND THROAT WITH OR WITHOUT PRESENCE OF ULCERS  *URINARY PROBLEMS  *BOWEL PROBLEMS  UNUSUAL RASH Items with * indicate a potential emergency and should be followed up as soon as possible.  Feel free to call the clinic should you have any questions or concerns. The clinic phone number is [phone].  Please show the CHEMO ALERT CARD at check-in to the Emergency Department and triage nurse.   

## 2020-02-06 NOTE — Progress Notes (Signed)
Hematology and Oncology Follow Up Visit  Gregory Schneider [id number] 1956/05/14 64 y.o. 02/06/2020 11:41 AM   Principle Diagnosis: 64 year old man with prostate cancer diagnosed in 2010 after presenting with advanced disease and Gleason score of 4+5 = 9.  He has castration-resistant disease with the bone involvement since 2013.  Prior Therapy:  Combined androgen deprivation with Lupron and Casodex. He had a good response and then developed a rise in the PSA with castrate level testosterone.  He is S/P Bilateral simple orchiectomy done on 12/25/2012.  Provenge started on 03/03/12. Therapy Ended in 03/31/2012. Case Zytiga started in 10/2013. Therapy discontinued in January 2018 because of progression of disease. Xofigo infusion to start on Apr 13, 2018.  He completed 6 months of therapy in November 2019. Status post prostate radiation between December 19, 2018 and January 01, 2019.  He received total of 30 Gy in 10 fractions to the prostate and the pubic rami. Xtandi 160 mg daily started in February 2018.  Therapy discontinued in October 2020 because of progression of disease.   Current therapy: Taxotere chemotherapy started on September 13, 2019.  His dose was reduced by 50% because of cytopenia and better tolerance.  He is here for cycle 7 of therapy.      Interim History:  Gregory Schneider is here for a follow-up visit.  Since the last visit, he reports no major changes in his health.  He continues to tolerate chemotherapy with few complications.  He reports some fatigue and some tiredness but no nausea or vomiting.  Continues to have chronic pain although manageable with hydrocodone only.  His pain is in hips as well as legs.  He denies any worsening neuropathy at this time.  His appetite is down to lost some weight.     Medications: Reviewed without changes. Current Outpatient Medications  Medication Sig Dispense Refill  . amLODipine (NORVASC) 2.5 MG tablet Take 2.5 mg by mouth daily.    Marland Kitchen  buPROPion (ZYBAN) 150 MG 12 hr tablet Take 150 mg by mouth.    . calcium carbonate (OS-CAL) 600 MG TABS Take 600 mg by mouth daily.    . cholecalciferol (VITAMIN D) 1000 UNITS tablet Take 1,000 Units by mouth daily.    . clonazePAM (KLONOPIN) 1 MG tablet Take 1 mg by mouth as needed for anxiety (1-2 tablets daily as needed).     . Cyanocobalamin (RA VITAMIN B-12 TR) 1000 MCG TBCR Take by mouth.    . gabapentin (NEURONTIN) 300 MG capsule Take 3 capsules (900 mg total) by mouth 2 (two) times daily. 180 capsule 3  . HYDROcodone-acetaminophen (NORCO/VICODIN) 5-325 MG tablet Take 1-2 tablets by mouth every 6 (six) hours as needed. 90 tablet 0  . lidocaine-prilocaine (EMLA) cream Apply 1 application topically as needed. 30 g 0  . losartan-hydrochlorothiazide (HYZAAR) 100-25 MG tablet   0  . Multiple Vitamin (MULTIVITAMIN) tablet Take 1 tablet by mouth daily.    . prochlorperazine (COMPAZINE) 10 MG tablet Take 1 tablet (10 mg total) by mouth every 6 (six) hours as needed for nausea or vomiting. 30 tablet 3  . sertraline (ZOLOFT) 100 MG tablet Take 200 mg by mouth every morning.      No current facility-administered medications for this visit.   Facility-Administered Medications Ordered in Other Visits  Medication Dose Route Frequency Provider Last Rate Last Admin  . sodium chloride flush (NS) 0.9 % injection 10 mL  10 mL Intracatheter PRN Alen Blew, Mathis Dad, MD   10 mL at  02/06/20 1130    Allergies:  Allergies  Allergen Reactions  . Penicillins Other (See Comments)    Patient stated,"I don't remember what kind of reaction because I was a kid."      Physical Exam:    Blood pressure (!) 159/83, pulse 72, temperature 98.7 F (37.1 C), temperature source Temporal, resp. rate 18, height 6\' 3"  (1.905 m), weight (!) 337 lb 8 oz (153.1 kg), SpO2 98 %.          ECOG: 1   General appearance: Alert, awake without any distress. Head: Atraumatic without abnormalities Oropharynx: Without any  thrush or ulcers. Eyes: No scleral icterus. Lymph nodes: No lymphadenopathy noted in the cervical, supraclavicular, or axillary nodes Heart:regular rate and rhythm, without any murmurs or gallops.   Lung: Clear to auscultation without any rhonchi, wheezes or dullness to percussion. Abdomin: Soft, nontender without any shifting dullness or ascites. Musculoskeletal: No clubbing or cyanosis. Neurological: No motor or sensory deficits. Skin: No rashes or lesions.               Lab Results: Lab Results  Component Value Date   WBC 4.1 01/16/2020   HGB 9.8 (L) 01/16/2020   HCT 30.1 (L) 01/16/2020   MCV 95.3 01/16/2020   PLT 72 (L) 01/16/2020     Chemistry      Component Value Date/Time   NA 141 01/16/2020 0847   NA 136 11/17/2017 0956   K 3.1 (L) 01/16/2020 0847   K 3.8 11/17/2017 0956   CL 103 01/16/2020 0847   CL 104 03/09/2013 0921   CO2 30 01/16/2020 0847   CO2 28 11/17/2017 0956   BUN 13 01/16/2020 0847   BUN 19.7 11/17/2017 0956   CREATININE 0.85 01/16/2020 0847   CREATININE 0.9 11/17/2017 0956      Component Value Date/Time   CALCIUM 8.8 (L) 01/16/2020 0847   CALCIUM 9.5 11/17/2017 0956   ALKPHOS 84 01/16/2020 0847   ALKPHOS 89 11/17/2017 0956   AST 16 01/16/2020 0847   AST 21 11/17/2017 0956   ALT 25 01/16/2020 0847   ALT 47 11/17/2017 0956   BILITOT 0.6 01/16/2020 0847   BILITOT 0.39 11/17/2017 0956          Results for Gregory Schneider (MRN [id number]) as of 02/06/2020 11:42  Ref. Range 12/05/2019 11:30 12/26/2019 09:42 01/16/2020 08:47  Prostate Specific Ag, Serum Latest Ref Range: 0.0 - 4.0 ng/mL 85.9 (H) 88.6 (H) 90.3 (H)        Impression and Plan:    64 year old man with  1.  Castration-resistant prostate cancer with disease to the bone diagnosed in 2013.    He continues to tolerate Taxotere at a lower dose much better at this time with manageable cytopenias.  Risks and benefits of continuing this treatments were reviewed today.  His  PSA is overall stable and has declined compared to prior to chemotherapy.  The plan is to proceed with a total of 10 cycles if he continues to tolerate it and will repeat imaging studies after that.  He is agreeable to proceed at this time.  Further options for his prostate cancer were reviewed which include potentially PARP inhibitor if he harbors the appropriate mutation, Jevtana chemotherapy or possibly lutetium PSMA once it is approved.  2. Androgen deprivation: No additional androgen deprivation is noted at this time.  3.  Bone directed therapy: I recommended continuing calcium and vitamin D supplements.  He deferred any further Zometa infusions.   4.  Prognosis and goals of care: His disease is incurable although aggressive measures are warranted given his excellent performance status.  5.  Thrombocytopenia: Platelet count remains overall stable and slightly improving at 79.  This is related to Taxotere chemotherapy and I recommended continuing the same dose and schedule of reduced Taxotere therapy as long as well, both 70.  6.  Bone pain: Related to malignancy and currently on hydrocodone which is manageable at this time.  7.  IV access: Port-A-Cath continues to be in use without any issues or complications.  8.  Antiemetics: Compazine is available to him without any recent nausea or vomiting.  9.  Growth factor support: He is at risk of developing neutropenia and sepsis and will receive growth factor support after each cycle of therapy.  10.  Neuropathy: Manageable currently with Neurontin at the current dose of 900 mg daily.  11. Follow up: In 3 weeks for a follow-up at the next cycle of therapy.  30 minutes were dedicated to this visit.  The time was spent on reviewing laboratory data, addressing treatment complications as well as outlining future plan of care.   Zola Button MD 3/24/202111:41 AM

## 2020-02-06 NOTE — Progress Notes (Signed)
plts 79 today. Okay to treat per Dr. Alen Blew.

## 2020-02-07 ENCOUNTER — Telehealth: Payer: Self-pay | Admitting: Oncology

## 2020-02-07 ENCOUNTER — Telehealth: Payer: Self-pay

## 2020-02-07 LAB — PROSTATE-SPECIFIC AG, SERUM (LABCORP): Prostate Specific Ag, Serum: 90.2 ng/mL — ABNORMAL HIGH (ref 0.0–4.0)

## 2020-02-07 NOTE — Telephone Encounter (Signed)
Called and left patient a message with PSA result. Instructed patient to call [phone] with any questions or concerns.

## 2020-02-07 NOTE — Telephone Encounter (Signed)
Scheduled appt per 3/24 los.

## 2020-02-07 NOTE — Telephone Encounter (Signed)
-----   Message from Wyatt Portela, MD sent at 02/07/2020  9:11 AM EDT ----- Please let him know his PSA is stable.  Thanks

## 2020-02-08 ENCOUNTER — Inpatient Hospital Stay: Payer: Medicare HMO

## 2020-02-08 ENCOUNTER — Other Ambulatory Visit: Payer: Self-pay

## 2020-02-08 VITALS — BP 164/89 | HR 76 | Temp 98.0°F | Resp 18

## 2020-02-08 DIAGNOSIS — C61 Malignant neoplasm of prostate: Secondary | ICD-10-CM

## 2020-02-08 DIAGNOSIS — C7951 Secondary malignant neoplasm of bone: Secondary | ICD-10-CM

## 2020-02-08 DIAGNOSIS — G893 Neoplasm related pain (acute) (chronic): Secondary | ICD-10-CM | POA: Diagnosis not present

## 2020-02-08 DIAGNOSIS — Z5189 Encounter for other specified aftercare: Secondary | ICD-10-CM | POA: Diagnosis not present

## 2020-02-08 DIAGNOSIS — R634 Abnormal weight loss: Secondary | ICD-10-CM | POA: Diagnosis not present

## 2020-02-08 DIAGNOSIS — D6959 Other secondary thrombocytopenia: Secondary | ICD-10-CM | POA: Diagnosis not present

## 2020-02-08 DIAGNOSIS — R350 Frequency of micturition: Secondary | ICD-10-CM | POA: Diagnosis not present

## 2020-02-08 DIAGNOSIS — Z5111 Encounter for antineoplastic chemotherapy: Secondary | ICD-10-CM | POA: Diagnosis not present

## 2020-02-08 DIAGNOSIS — G629 Polyneuropathy, unspecified: Secondary | ICD-10-CM | POA: Diagnosis not present

## 2020-02-08 DIAGNOSIS — R3915 Urgency of urination: Secondary | ICD-10-CM | POA: Diagnosis not present

## 2020-02-08 MED ORDER — PEGFILGRASTIM-CBQV 6 MG/0.6ML ~~LOC~~ SOSY
PREFILLED_SYRINGE | SUBCUTANEOUS | Status: AC
  Filled 2020-02-08: qty 0.6

## 2020-02-08 MED ORDER — PEGFILGRASTIM-CBQV 6 MG/0.6ML ~~LOC~~ SOSY
6.0000 mg | PREFILLED_SYRINGE | Freq: Once | SUBCUTANEOUS | Status: AC
  Administered 2020-02-08: 6 mg via SUBCUTANEOUS

## 2020-02-08 NOTE — Patient Instructions (Signed)

## 2020-02-22 NOTE — Progress Notes (Signed)
Pharmacist Chemotherapy Monitoring - Follow Up Assessment    I verify that I have reviewed each item in the below checklist:  . Regimen for the patient is scheduled for the appropriate day and plan matches scheduled date. Marland Kitchen Appropriate non-routine labs are ordered dependent on drug ordered. . If applicable, additional medications reviewed and ordered per protocol based on lifetime cumulative doses and/or treatment regimen.   Plan for follow-up and/or issues identified: No . I-vent associated with next due treatment: No . MD and/or nursing notified: No  Romualdo Bolk [facility] 02/22/2020 1:18 PM

## 2020-02-27 ENCOUNTER — Inpatient Hospital Stay: Payer: Medicare HMO

## 2020-02-27 ENCOUNTER — Other Ambulatory Visit: Payer: Self-pay

## 2020-02-27 ENCOUNTER — Inpatient Hospital Stay (HOSPITAL_BASED_OUTPATIENT_CLINIC_OR_DEPARTMENT_OTHER): Payer: Medicare HMO | Admitting: Oncology

## 2020-02-27 ENCOUNTER — Inpatient Hospital Stay: Payer: Medicare HMO | Attending: Oncology

## 2020-02-27 ENCOUNTER — Telehealth: Payer: Self-pay | Admitting: Oncology

## 2020-02-27 VITALS — BP 148/67 | HR 75 | Temp 98.2°F | Resp 18 | Ht 75.0 in | Wt 336.0 lb

## 2020-02-27 DIAGNOSIS — C61 Malignant neoplasm of prostate: Secondary | ICD-10-CM | POA: Insufficient documentation

## 2020-02-27 DIAGNOSIS — G629 Polyneuropathy, unspecified: Secondary | ICD-10-CM | POA: Diagnosis not present

## 2020-02-27 DIAGNOSIS — D696 Thrombocytopenia, unspecified: Secondary | ICD-10-CM | POA: Insufficient documentation

## 2020-02-27 DIAGNOSIS — C7951 Secondary malignant neoplasm of bone: Secondary | ICD-10-CM | POA: Diagnosis not present

## 2020-02-27 DIAGNOSIS — Z923 Personal history of irradiation: Secondary | ICD-10-CM | POA: Insufficient documentation

## 2020-02-27 DIAGNOSIS — Z79899 Other long term (current) drug therapy: Secondary | ICD-10-CM | POA: Insufficient documentation

## 2020-02-27 DIAGNOSIS — Z5189 Encounter for other specified aftercare: Secondary | ICD-10-CM | POA: Diagnosis not present

## 2020-02-27 DIAGNOSIS — Z9079 Acquired absence of other genital organ(s): Secondary | ICD-10-CM | POA: Diagnosis not present

## 2020-02-27 DIAGNOSIS — Z95828 Presence of other vascular implants and grafts: Secondary | ICD-10-CM

## 2020-02-27 DIAGNOSIS — Z5111 Encounter for antineoplastic chemotherapy: Secondary | ICD-10-CM | POA: Diagnosis present

## 2020-02-27 LAB — CMP (CANCER CENTER ONLY)
ALT: 24 U/L (ref 0–44)
AST: 16 U/L (ref 15–41)
Albumin: 3.6 g/dL (ref 3.5–5.0)
Alkaline Phosphatase: 101 U/L (ref 38–126)
Anion gap: 9 (ref 5–15)
BUN: 15 mg/dL (ref 8–23)
CO2: 28 mmol/L (ref 22–32)
Calcium: 8.7 mg/dL — ABNORMAL LOW (ref 8.9–10.3)
Chloride: 105 mmol/L (ref 98–111)
Creatinine: 0.89 mg/dL (ref 0.61–1.24)
GFR, Est AFR Am: >60 mL/min (ref >60)
GFR, Estimated: >60 mL/min (ref >60)
Glucose, Bld: 139 mg/dL — ABNORMAL HIGH (ref 70–99)
Potassium: 3.7 mmol/L (ref 3.5–5.1)
Sodium: 142 mmol/L (ref 135–145)
Total Bilirubin: 0.5 mg/dL (ref 0.3–1.2)
Total Protein: 6.6 g/dL (ref 6.5–8.1)

## 2020-02-27 LAB — CBC WITH DIFFERENTIAL (CANCER CENTER ONLY)
Abs Immature Granulocytes: 0.04 10*3/uL (ref 0.00–0.07)
Basophils Absolute: 0 10*3/uL (ref 0.0–0.1)
Basophils Relative: 0 %
Eosinophils Absolute: 0 10*3/uL (ref 0.0–0.5)
Eosinophils Relative: 1 %
HCT: 28.5 % — ABNORMAL LOW (ref 39.0–52.0)
Hemoglobin: 8.9 g/dL — ABNORMAL LOW (ref 13.0–17.0)
Immature Granulocytes: 1 %
Lymphocytes Relative: 7 %
Lymphs Abs: 0.3 10*3/uL — ABNORMAL LOW (ref 0.7–4.0)
MCH: 30.1 pg (ref 26.0–34.0)
MCHC: 31.2 g/dL (ref 30.0–36.0)
MCV: 96.3 fL (ref 80.0–100.0)
Monocytes Absolute: 0.2 10*3/uL (ref 0.1–1.0)
Monocytes Relative: 5 %
Neutro Abs: 3.6 10*3/uL (ref 1.7–7.7)
Neutrophils Relative %: 86 %
Platelet Count: 66 10*3/uL — ABNORMAL LOW (ref 150–400)
RBC: 2.96 MIL/uL — ABNORMAL LOW (ref 4.22–5.81)
RDW: 19.2 % — ABNORMAL HIGH (ref 11.5–15.5)
WBC Count: 4.2 10*3/uL (ref 4.0–10.5)
nRBC: 0 % (ref 0.0–0.2)

## 2020-02-27 MED ORDER — SODIUM CHLORIDE 0.9 % IV SOLN
10.0000 mg | Freq: Once | INTRAVENOUS | Status: AC
  Administered 2020-02-27: 10 mg via INTRAVENOUS
  Filled 2020-02-27: qty 10

## 2020-02-27 MED ORDER — HEPARIN SOD (PORK) LOCK FLUSH 100 UNIT/ML IV SOLN
500.0000 [IU] | Freq: Once | INTRAVENOUS | Status: AC | PRN
  Administered 2020-02-27: 500 [IU]
  Filled 2020-02-27: qty 5

## 2020-02-27 MED ORDER — SODIUM CHLORIDE 0.9 % IV SOLN
37.5000 mg/m2 | Freq: Once | INTRAVENOUS | Status: AC
  Administered 2020-02-27: 110 mg via INTRAVENOUS
  Filled 2020-02-27: qty 11

## 2020-02-27 MED ORDER — SODIUM CHLORIDE 0.9% FLUSH
10.0000 mL | INTRAVENOUS | Status: DC | PRN
  Administered 2020-02-27: 10 mL
  Filled 2020-02-27: qty 10

## 2020-02-27 MED ORDER — SODIUM CHLORIDE 0.9 % IV SOLN
Freq: Once | INTRAVENOUS | Status: AC
  Filled 2020-02-27: qty 250

## 2020-02-27 NOTE — Patient Instructions (Signed)
[facility] Discharge Instructions for Patients Receiving Chemotherapy  Today you received the following chemotherapy agents: Taxotere  To help prevent nausea and vomiting after your treatment, we encourage you to take your nausea medication as directed.    If you develop nausea and vomiting that is not controlled by your nausea medication, call the clinic.   BELOW ARE SYMPTOMS THAT SHOULD BE REPORTED IMMEDIATELY:  *FEVER GREATER THAN 100.5 F  *CHILLS WITH OR WITHOUT FEVER  NAUSEA AND VOMITING THAT IS NOT CONTROLLED WITH YOUR NAUSEA MEDICATION  *UNUSUAL SHORTNESS OF BREATH  *UNUSUAL BRUISING OR BLEEDING  TENDERNESS IN MOUTH AND THROAT WITH OR WITHOUT PRESENCE OF ULCERS  *URINARY PROBLEMS  *BOWEL PROBLEMS  UNUSUAL RASH Items with * indicate a potential emergency and should be followed up as soon as possible.  Feel free to call the clinic should you have any questions or concerns. The clinic phone number is [phone].  Please show the CHEMO ALERT CARD at check-in to the Emergency Department and triage nurse.   

## 2020-02-27 NOTE — Patient Instructions (Signed)

## 2020-02-27 NOTE — Progress Notes (Signed)
Hematology and Oncology Follow Up Visit  Gregory Schneider [id number] February 25, 1956 64 y.o. 02/27/2020 9:22 AM   Principle Diagnosis: 64 year old man with castration-resistant prostate cancer with disease to the bone noted in 2013.  He presented in 2010 with advanced disease and Gleason score of 4+5 = 9.   Prior Therapy:  Combined androgen deprivation with Lupron and Casodex. He had a good response and then developed a rise in the PSA with castrate level testosterone.  He is S/P Bilateral simple orchiectomy done on 12/25/2012.  Provenge started on 03/03/12. Therapy Ended in 03/31/2012. Case Zytiga started in 10/2013. Therapy discontinued in January 2018 because of progression of disease. Xofigo infusion to start on Apr 13, 2018.  He completed 6 months of therapy in November 2019. Status post prostate radiation between December 19, 2018 and January 01, 2019.  He received total of 30 Gy in 10 fractions to the prostate and the pubic rami. Xtandi 160 mg daily started in February 2018.  Therapy discontinued in October 2020 because of progression of disease.   Current therapy: Taxotere chemotherapy started on September 13, 2019.  His dose was reduced by 50% because of cytopenia and better tolerance.  He is here for cycle 8 of therapy.      Interim History:  Gregory Schneider presents today for a repeat evaluation.  Since the last visit, he reports no major changes in his health.  He continues to tolerate chemotherapy with few complaints.  He does report some fatigue and tiredness and neuropathy in his lower extremities.  His neuropathy has not worsened and proceeded to chemotherapy.  His appetite is slightly down although overall maintaining adequate nutrition.  Denies any nausea, vomiting or abdominal pain.  He denies any worsening bone pain.  Still enjoy reasonable quality of life.     Medications: Updated on review. Current Outpatient Medications  Medication Sig Dispense Refill  . amLODipine (NORVASC) 2.5  MG tablet Take 2.5 mg by mouth daily.    Marland Kitchen buPROPion (ZYBAN) 150 MG 12 hr tablet Take 150 mg by mouth.    . calcium carbonate (OS-CAL) 600 MG TABS Take 600 mg by mouth daily.    . cholecalciferol (VITAMIN D) 1000 UNITS tablet Take 1,000 Units by mouth daily.    . clonazePAM (KLONOPIN) 1 MG tablet Take 1 mg by mouth as needed for anxiety (1-2 tablets daily as needed).     . Cyanocobalamin (RA VITAMIN B-12 TR) 1000 MCG TBCR Take by mouth.    . gabapentin (NEURONTIN) 300 MG capsule Take 3 capsules (900 mg total) by mouth 2 (two) times daily. 180 capsule 3  . HYDROcodone-acetaminophen (NORCO/VICODIN) 5-325 MG tablet Take 1-2 tablets by mouth every 6 (six) hours as needed. 90 tablet 0  . lidocaine-prilocaine (EMLA) cream Apply 1 application topically as needed. 30 g 0  . losartan-hydrochlorothiazide (HYZAAR) 100-25 MG tablet   0  . Multiple Vitamin (MULTIVITAMIN) tablet Take 1 tablet by mouth daily.    . prochlorperazine (COMPAZINE) 10 MG tablet Take 1 tablet (10 mg total) by mouth every 6 (six) hours as needed for nausea or vomiting. 30 tablet 3  . sertraline (ZOLOFT) 100 MG tablet Take 200 mg by mouth every morning.      No current facility-administered medications for this visit.   Facility-Administered Medications Ordered in Other Visits  Medication Dose Route Frequency Provider Last Rate Last Admin  . sodium chloride flush (NS) 0.9 % injection 10 mL  10 mL Intracatheter PRN Wyatt Portela, MD  10 mL at 02/27/20 0916    Allergies:  Allergies  Allergen Reactions  . Penicillins Other (See Comments)    Patient stated,"I don't remember what kind of reaction because I was a kid."      Physical Exam:  Blood pressure (!) 148/67, pulse 75, temperature 98.2 F (36.8 C), temperature source Temporal, resp. rate 18, height 6\' 3"  (1.905 m), weight (!) 336 lb (152.4 kg), SpO2 96 %.     ECOG: 1    General appearance: Comfortable appearing without any discomfort Head: Normocephalic  without any trauma Oropharynx: Mucous membranes are moist and pink without any thrush or ulcers. Eyes: Pupils are equal and round reactive to light. Lymph nodes: No cervical, supraclavicular, inguinal or axillary lymphadenopathy.   Heart:regular rate and rhythm.  S1 and S2 without leg edema. Lung: Clear without any rhonchi or wheezes.  No dullness to percussion. Abdomin: Soft, nontender, nondistended with good bowel sounds.  No hepatosplenomegaly. Musculoskeletal: No joint deformity or effusion.  Full range of motion noted. Neurological: No deficits noted on motor, sensory and deep tendon reflex exam. Skin: No petechial rash or dryness.  Appeared moist.                 Lab Results: Lab Results  Component Value Date   WBC 3.7 (L) 02/06/2020   HGB 9.3 (L) 02/06/2020   HCT 29.2 (L) 02/06/2020   MCV 95.7 02/06/2020   PLT 79 (L) 02/06/2020     Chemistry      Component Value Date/Time   NA 140 02/06/2020 1132   NA 136 11/17/2017 0956   K 3.5 02/06/2020 1132   K 3.8 11/17/2017 0956   CL 103 02/06/2020 1132   CL 104 03/09/2013 0921   CO2 30 02/06/2020 1132   CO2 28 11/17/2017 0956   BUN 10 02/06/2020 1132   BUN 19.7 11/17/2017 0956   CREATININE 0.82 02/06/2020 1132   CREATININE 0.9 11/17/2017 0956      Component Value Date/Time   CALCIUM 8.7 (L) 02/06/2020 1132   CALCIUM 9.5 11/17/2017 0956   ALKPHOS 90 02/06/2020 1132   ALKPHOS 89 11/17/2017 0956   AST 17 02/06/2020 1132   AST 21 11/17/2017 0956   ALT 25 02/06/2020 1132   ALT 47 11/17/2017 0956   BILITOT 0.5 02/06/2020 1132   BILITOT 0.39 11/17/2017 0956        Results for Gregory Schneider (MRN [id number]) as of 02/27/2020 09:24  Ref. Range 01/16/2020 08:47 02/06/2020 11:32  Prostate Specific Ag, Serum Latest Ref Range: 0.0 - 4.0 ng/mL 90.3 (H) 90.2 (H)           Impression and Plan:    64 year old man with  1.  Advanced prostate cancer with disease to the bone since 2013.  He has  castration-resistant he is at this time.  He is currently on Taxotere chemotherapy without any major complications.  His PSA remains relatively stable without any further decline.  Risks and benefits of continuing this treatments were reviewed today.  Potential complications include nausea, vomiting myelosuppression and neuropathy.  Alternative options have been reviewed will continue to be reiterated especially if he develops progression of disease.   The plan is to complete 10 cycles of therapy and repeat staging work-up at that time.  2. Androgen deprivation: He status post orchiectomy without any additional androgen deprivation is needed.  3.  Bone directed therapy: He is on calcium vitamin D and has declined any further Zometa infusion.   4.  Prognosis and goals of care: Therapy remains palliative although his performance status is adequate and aggressive measures are warranted for the time being.  5.  Thrombocytopenia: Slightly worse at this time without any bleeding.  We will proceed today without any dose reduction or delay and will continue to monitor closely.  6.  Bone pain: Manageable with hydrocodone.  His pain is related to metastatic prostate cancer to the bone.  7.  IV access: Port-A-Cath remains in place without any issues.  8.  Antiemetics: No nausea or vomiting at this time Compazine is available for him.  9.  Growth factor support: He will receive growth factor support after each cycle of therapy.  10.  Neuropathy: Continues to be on Neurontin at 900 mg total dose of day.  11. Follow up: He will return in 3 weeks for a follow-up evaluation.  30 minutes were spent on this encounter.  The time was dedicated to reviewing the natural course of his disease, reviewing laboratory data, addressing complications related to current therapy and overall prognosis.    Zola Button MD 4/14/20219:22 AM

## 2020-02-27 NOTE — Telephone Encounter (Signed)
Scheduled appt per 4/14 los. °

## 2020-02-28 ENCOUNTER — Telehealth: Payer: Self-pay

## 2020-02-28 LAB — PROSTATE-SPECIFIC AG, SERUM (LABCORP): Prostate Specific Ag, Serum: 116 ng/mL — ABNORMAL HIGH (ref 0.0–4.0)

## 2020-02-28 NOTE — Telephone Encounter (Signed)
-----   Message from Wyatt Portela, MD sent at 02/28/2020  1:46 PM EDT ----- Please let him know his PSA.  It is slightly up but no changes needed at this time.

## 2020-02-28 NOTE — Telephone Encounter (Signed)
Called and informed patient of PSA results. Patient verbalized understanding and did not have any questions.

## 2020-02-29 ENCOUNTER — Inpatient Hospital Stay: Payer: Medicare HMO

## 2020-02-29 ENCOUNTER — Other Ambulatory Visit: Payer: Self-pay

## 2020-02-29 VITALS — BP 134/77 | HR 79 | Temp 97.8°F | Resp 20

## 2020-02-29 DIAGNOSIS — Z5189 Encounter for other specified aftercare: Secondary | ICD-10-CM | POA: Diagnosis not present

## 2020-02-29 DIAGNOSIS — G629 Polyneuropathy, unspecified: Secondary | ICD-10-CM | POA: Diagnosis not present

## 2020-02-29 DIAGNOSIS — Z79899 Other long term (current) drug therapy: Secondary | ICD-10-CM | POA: Diagnosis not present

## 2020-02-29 DIAGNOSIS — C7951 Secondary malignant neoplasm of bone: Secondary | ICD-10-CM

## 2020-02-29 DIAGNOSIS — Z923 Personal history of irradiation: Secondary | ICD-10-CM | POA: Diagnosis not present

## 2020-02-29 DIAGNOSIS — C61 Malignant neoplasm of prostate: Secondary | ICD-10-CM

## 2020-02-29 DIAGNOSIS — D696 Thrombocytopenia, unspecified: Secondary | ICD-10-CM | POA: Diagnosis not present

## 2020-02-29 DIAGNOSIS — Z5111 Encounter for antineoplastic chemotherapy: Secondary | ICD-10-CM | POA: Diagnosis not present

## 2020-02-29 DIAGNOSIS — Z9079 Acquired absence of other genital organ(s): Secondary | ICD-10-CM | POA: Diagnosis not present

## 2020-02-29 MED ORDER — PEGFILGRASTIM-CBQV 6 MG/0.6ML ~~LOC~~ SOSY
PREFILLED_SYRINGE | SUBCUTANEOUS | Status: AC
  Filled 2020-02-29: qty 0.6

## 2020-02-29 MED ORDER — PEGFILGRASTIM-CBQV 6 MG/0.6ML ~~LOC~~ SOSY
6.0000 mg | PREFILLED_SYRINGE | Freq: Once | SUBCUTANEOUS | Status: AC
  Administered 2020-02-29: 6 mg via SUBCUTANEOUS

## 2020-02-29 NOTE — Patient Instructions (Signed)

## 2020-03-10 ENCOUNTER — Telehealth: Payer: Self-pay

## 2020-03-10 NOTE — Telephone Encounter (Signed)
Patient called stating that he needs a refill on his hydrocodone medication. Dr Alen Blew out of office today. Let note on his date of medication refill request.

## 2020-03-11 ENCOUNTER — Other Ambulatory Visit: Payer: Self-pay | Admitting: Oncology

## 2020-03-11 DIAGNOSIS — C61 Malignant neoplasm of prostate: Secondary | ICD-10-CM

## 2020-03-11 DIAGNOSIS — C7951 Secondary malignant neoplasm of bone: Secondary | ICD-10-CM

## 2020-03-11 MED ORDER — HYDROCODONE-ACETAMINOPHEN 5-325 MG PO TABS: 1.0000 | ORAL_TABLET | Freq: Four times a day (QID) | ORAL | 0 refills | Status: DC | PRN

## 2020-03-13 NOTE — Progress Notes (Signed)
Pharmacist Chemotherapy Monitoring - Follow Up Assessment    I verify that I have reviewed each item in the below checklist:  . Regimen for the patient is scheduled for the appropriate day and plan matches scheduled date. Marland Kitchen Appropriate non-routine labs are ordered dependent on drug ordered. . If applicable, additional medications reviewed and ordered per protocol based on lifetime cumulative doses and/or treatment regimen.   Plan for follow-up and/or issues identified: No . I-vent associated with next due treatment: No . MD and/or nursing notified: No  Gregory Schneider 03/13/2020 12:56 PM

## 2020-03-19 ENCOUNTER — Other Ambulatory Visit: Payer: Self-pay

## 2020-03-19 ENCOUNTER — Inpatient Hospital Stay: Payer: Medicare HMO

## 2020-03-19 ENCOUNTER — Inpatient Hospital Stay: Payer: Medicare HMO | Attending: Oncology

## 2020-03-19 ENCOUNTER — Inpatient Hospital Stay: Payer: Medicare HMO | Admitting: Oncology

## 2020-03-19 VITALS — BP 150/87 | HR 74 | Temp 98.0°F | Resp 20 | Ht 75.0 in | Wt 334.5 lb

## 2020-03-19 DIAGNOSIS — Z79899 Other long term (current) drug therapy: Secondary | ICD-10-CM | POA: Insufficient documentation

## 2020-03-19 DIAGNOSIS — C61 Malignant neoplasm of prostate: Secondary | ICD-10-CM

## 2020-03-19 DIAGNOSIS — R590 Localized enlarged lymph nodes: Secondary | ICD-10-CM | POA: Insufficient documentation

## 2020-03-19 DIAGNOSIS — G629 Polyneuropathy, unspecified: Secondary | ICD-10-CM | POA: Diagnosis not present

## 2020-03-19 DIAGNOSIS — Z95828 Presence of other vascular implants and grafts: Secondary | ICD-10-CM

## 2020-03-19 DIAGNOSIS — Z5189 Encounter for other specified aftercare: Secondary | ICD-10-CM | POA: Diagnosis not present

## 2020-03-19 DIAGNOSIS — Z5111 Encounter for antineoplastic chemotherapy: Secondary | ICD-10-CM | POA: Insufficient documentation

## 2020-03-19 DIAGNOSIS — C7951 Secondary malignant neoplasm of bone: Secondary | ICD-10-CM

## 2020-03-19 DIAGNOSIS — D6959 Other secondary thrombocytopenia: Secondary | ICD-10-CM | POA: Diagnosis not present

## 2020-03-19 LAB — CMP (CANCER CENTER ONLY)
ALT: 22 U/L (ref 0–44)
AST: 17 U/L (ref 15–41)
Albumin: 3.7 g/dL (ref 3.5–5.0)
Alkaline Phosphatase: 107 U/L (ref 38–126)
Anion gap: 7 (ref 5–15)
BUN: 15 mg/dL (ref 8–23)
CO2: 31 mmol/L (ref 22–32)
Calcium: 8.4 mg/dL — ABNORMAL LOW (ref 8.9–10.3)
Chloride: 104 mmol/L (ref 98–111)
Creatinine: 0.8 mg/dL (ref 0.61–1.24)
GFR, Est AFR Am: >60 mL/min
GFR, Estimated: >60 mL/min
Glucose, Bld: 101 mg/dL — ABNORMAL HIGH (ref 70–99)
Potassium: 4.3 mmol/L (ref 3.5–5.1)
Sodium: 142 mmol/L (ref 135–145)
Total Bilirubin: 0.4 mg/dL (ref 0.3–1.2)
Total Protein: 6.6 g/dL (ref 6.5–8.1)

## 2020-03-19 LAB — CBC WITH DIFFERENTIAL (CANCER CENTER ONLY)
Abs Immature Granulocytes: 0.04 K/uL (ref 0.00–0.07)
Basophils Absolute: 0 K/uL (ref 0.0–0.1)
Basophils Relative: 0 %
Eosinophils Absolute: 0 K/uL (ref 0.0–0.5)
Eosinophils Relative: 1 %
HCT: 27.2 % — ABNORMAL LOW (ref 39.0–52.0)
Hemoglobin: 8.4 g/dL — ABNORMAL LOW (ref 13.0–17.0)
Immature Granulocytes: 1 %
Lymphocytes Relative: 9 %
Lymphs Abs: 0.3 K/uL — ABNORMAL LOW (ref 0.7–4.0)
MCH: 30.7 pg (ref 26.0–34.0)
MCHC: 30.9 g/dL (ref 30.0–36.0)
MCV: 99.3 fL (ref 80.0–100.0)
Monocytes Absolute: 0.2 K/uL (ref 0.1–1.0)
Monocytes Relative: 6 %
Neutro Abs: 3.1 K/uL (ref 1.7–7.7)
Neutrophils Relative %: 83 %
Platelet Count: 64 K/uL — ABNORMAL LOW (ref 150–400)
RBC: 2.74 MIL/uL — ABNORMAL LOW (ref 4.22–5.81)
RDW: 19.6 % — ABNORMAL HIGH (ref 11.5–15.5)
WBC Count: 3.8 K/uL — ABNORMAL LOW (ref 4.0–10.5)
nRBC: 0 % (ref 0.0–0.2)

## 2020-03-19 MED ORDER — SODIUM CHLORIDE 0.9 % IV SOLN
Freq: Once | INTRAVENOUS | Status: AC
  Filled 2020-03-19: qty 250

## 2020-03-19 MED ORDER — SODIUM CHLORIDE 0.9% FLUSH
10.0000 mL | INTRAVENOUS | Status: DC | PRN
  Administered 2020-03-19: 10 mL
  Filled 2020-03-19: qty 10

## 2020-03-19 MED ORDER — SODIUM CHLORIDE 0.9 % IV SOLN
10.0000 mg | Freq: Once | INTRAVENOUS | Status: AC
  Administered 2020-03-19: 10 mg via INTRAVENOUS
  Filled 2020-03-19: qty 10

## 2020-03-19 MED ORDER — HEPARIN SOD (PORK) LOCK FLUSH 100 UNIT/ML IV SOLN
500.0000 [IU] | Freq: Once | INTRAVENOUS | Status: AC | PRN
  Administered 2020-03-19: 500 [IU]
  Filled 2020-03-19: qty 5

## 2020-03-19 MED ORDER — SODIUM CHLORIDE 0.9 % IV SOLN
37.5000 mg/m2 | Freq: Once | INTRAVENOUS | Status: AC
  Administered 2020-03-19: 110 mg via INTRAVENOUS
  Filled 2020-03-19: qty 11

## 2020-03-19 NOTE — Patient Instructions (Signed)
[facility] Discharge Instructions for Patients Receiving Chemotherapy  Today you received the following chemotherapy agents: docetaxel.  To help prevent nausea and vomiting after your treatment, we encourage you to take your nausea medication as directed.   If you develop nausea and vomiting that is not controlled by your nausea medication, call the clinic.   BELOW ARE SYMPTOMS THAT SHOULD BE REPORTED IMMEDIATELY:  *FEVER GREATER THAN 100.5 F  *CHILLS WITH OR WITHOUT FEVER  NAUSEA AND VOMITING THAT IS NOT CONTROLLED WITH YOUR NAUSEA MEDICATION  *UNUSUAL SHORTNESS OF BREATH  *UNUSUAL BRUISING OR BLEEDING  TENDERNESS IN MOUTH AND THROAT WITH OR WITHOUT PRESENCE OF ULCERS  *URINARY PROBLEMS  *BOWEL PROBLEMS  UNUSUAL RASH Items with * indicate a potential emergency and should be followed up as soon as possible.  Feel free to call the clinic should you have any questions or concerns. The clinic phone number is [phone].  Please show the CHEMO ALERT CARD at check-in to the Emergency Department and triage nurse.   

## 2020-03-19 NOTE — Progress Notes (Signed)
Hematology and Oncology Follow Up Visit  Gregory Schneider Schneider [birth year] 64 y.o. 03/19/2020 12:01 PM   Principle Diagnosis: 64 year old man with advanced prostate cancer with disease to the lymph nodes diagnosed in 2013.  He has castration-resistant after initially diagnosed in 2010 with Gleason score of 4+5 = 9.   Prior Therapy:  Combined androgen deprivation with Lupron and Casodex. He had a good response and then developed a rise in the PSA with castrate level testosterone.  He is S/P Bilateral simple orchiectomy done on 12/25/2012.  Provenge started on 03/03/12. Therapy Ended in 03/31/2012. Case Zytiga started in 10/2013. Therapy discontinued in January 2018 because of progression of disease. Xofigo infusion to start on Apr 13, 2018.  He completed 6 months of therapy in November 2019. Status post prostate radiation between December 19, 2018 and January 01, 2019.  He received total of 30 Gy in 10 fractions to the prostate and the pubic rami. Xtandi 160 mg daily started in February 2018.  Therapy discontinued in October 2020 because of progression of disease.   Current therapy: Taxotere chemotherapy started on September 13, 2019.  His dose was reduced by 50% because of cytopenia and better tolerance.  He is here for cycle 9 of therapy.      Interim History:  Gregory Schneider is here for a repeat evaluation.  Since the last visit, he reports no major changes in his health.  He continues to tolerate chemotherapy without any major complaints.  He denies any nausea vomiting or abdominal pain.  He denies any worsening neuropathy.  Performance status and quality of life remains maintained without any decline.  Denies any worsening back pain bone pain.  He does ambulate with the help of a cane.     Medications: Unchanged on review. Current Outpatient Medications  Medication Sig Dispense Refill  . amLODipine (NORVASC) 2.5 MG tablet Take 2.5 mg by mouth daily.    Marland Kitchen buPROPion (ZYBAN) 150 MG 12 hr  tablet Take 150 mg by mouth.    . calcium carbonate (OS-CAL) 600 MG TABS Take 600 mg by mouth daily.    . cholecalciferol (VITAMIN D) 1000 UNITS tablet Take 1,000 Units by mouth daily.    . clonazePAM (KLONOPIN) 1 MG tablet Take 1 mg by mouth as needed for anxiety (1-2 tablets daily as needed).     . Cyanocobalamin (RA VITAMIN B-12 TR) 1000 MCG TBCR Take by mouth.    . gabapentin (NEURONTIN) 300 MG capsule Take 3 capsules (900 mg total) by mouth 2 (two) times daily. 180 capsule 3  . HYDROcodone-acetaminophen (NORCO/VICODIN) 5-325 MG tablet Take 1-2 tablets by mouth every 6 (six) hours as needed. 90 tablet 0  . lidocaine-prilocaine (EMLA) cream Apply 1 application topically as needed. 30 g 0  . losartan-hydrochlorothiazide (HYZAAR) 100-25 MG tablet   0  . Multiple Vitamin (MULTIVITAMIN) tablet Take 1 tablet by mouth daily.    . prochlorperazine (COMPAZINE) 10 MG tablet Take 1 tablet (10 mg total) by mouth every 6 (six) hours as needed for nausea or vomiting. 30 tablet 3  . sertraline (ZOLOFT) 100 MG tablet Take 200 mg by mouth every morning.      No current facility-administered medications for this visit.   Facility-Administered Medications Ordered in Other Visits  Medication Dose Route Frequency Provider Last Rate Last Admin  . sodium chloride flush (NS) 0.9 % injection 10 mL  10 mL Intracatheter PRN Wyatt Portela, MD   10 mL at 03/19/20 1153  Allergies:  Allergies  Allergen Reactions  . Penicillins Other (See Comments)    Patient stated,"I don't remember what kind of reaction because I was a kid."      Physical Exam:   Blood pressure (!) 150/87, pulse 74, temperature 98 F (36.7 C), temperature source Temporal, resp. rate 20, height 6\' 3"  (1.905 m), weight (!) 334 lb 8 oz (151.7 kg), SpO2 98 %.     ECOG: 1    General appearance: Alert, awake without any distress. Head: Atraumatic without abnormalities Oropharynx: Without any thrush or ulcers. Eyes: No scleral  icterus. Lymph nodes: No lymphadenopathy noted in the cervical, supraclavicular, or axillary nodes Heart:regular rate and rhythm, without any murmurs or gallops.   Lung: Clear to auscultation without any rhonchi, wheezes or dullness to percussion. Abdomin: Soft, nontender without any shifting dullness or ascites. Musculoskeletal: No clubbing or cyanosis. Neurological: No motor or sensory deficits. Skin: No rashes or lesions.                Lab Results: Lab Results  Component Value Date   WBC 4.2 02/27/2020   HGB 8.9 (L) 02/27/2020   HCT 28.5 (L) 02/27/2020   MCV 96.3 02/27/2020   PLT 66 (L) 02/27/2020     Chemistry      Component Value Date/Time   NA 142 02/27/2020 0910   NA 136 11/17/2017 0956   K 3.7 02/27/2020 0910   K 3.8 11/17/2017 0956   CL 105 02/27/2020 0910   CL 104 03/09/2013 0921   CO2 28 02/27/2020 0910   CO2 28 11/17/2017 0956   BUN 15 02/27/2020 0910   BUN 19.7 11/17/2017 0956   CREATININE 0.89 02/27/2020 0910   CREATININE 0.9 11/17/2017 0956      Component Value Date/Time   CALCIUM 8.7 (L) 02/27/2020 0910   CALCIUM 9.5 11/17/2017 0956   ALKPHOS 101 02/27/2020 0910   ALKPHOS 89 11/17/2017 0956   AST 16 02/27/2020 0910   AST 21 11/17/2017 0956   ALT 24 02/27/2020 0910   ALT 47 11/17/2017 0956   BILITOT 0.5 02/27/2020 0910   BILITOT 0.39 11/17/2017 0956         Results for Gregory, Schneider (Gregory Schneider) as of 03/19/2020 11:42  Ref. Range 02/06/2020 11:32 02/27/2020 09:10  Prostate Specific Ag, Serum Latest Ref Range: 0.0 - 4.0 ng/mL 90.2 (H) 116.0 (H)           Impression and Plan:    64 year old man with  1.  Prostate cancer diagnosed in 2010.  He developed castration-resistant advanced disease with pelvic adenopathy in 2013.  He is currently on Taxotere chemotherapy with reasonable tolerance at this time.  He experienced a reasonable PSA decline from 135 to 90.  Currently his PSA is up to 116.  Risks and benefits of  continuing this therapy versus alternative treatment options were discussed.  This would include different salvage chemotherapy such as Jevtana.  At this time I recommended continuing Taxotere chemotherapy to complete 10 cycles and restage after that with imaging studies.  He is agreeable to proceed.  2. Androgen deprivation: Testosterone will be repeated to ensure castration level.  His last testosterone was on 11 indicating castration state.  3.  Bone directed therapy: He has declined further Zometa at this time.  Recommended calcium and vitamin D supplements.   4.  Prognosis and goals of care: His disease is incurable but aggressive measures are warranted given his reasonable performance status.  He still desires aggressive measures  at the present time.  5.  Thrombocytopenia: Related to chemotherapy without any active bleeding.  Blood count is adequate today and will proceed without any dose reduction or delay.  6.  Bone pain: He is currently on hydrocodone which is managing his pain reasonably at this time.  7.  IV access: Port-A-Cath currently in use without any issues.  8.  Antiemetics: Compazine is available to him without any recent nausea or vomiting.  9.  Growth factor support: He is at risk of developing neutropenia and sepsis.  He will receive growth factor support after each treatment.  10.  Neuropathy: Related to his malignancy and preceding his chemotherapy.  He is currently on Neurontin managing his symptoms.  11. Follow up: In 4 weeks for the next cycle of chemotherapy.  Chemotherapy will be pushed 1 week based on his wishes to his travel.  30 minutes were dedicated to this visit.  Time was spent on updating his disease status, addressing complications related to his therapy and future plan of care discussion.   Zola Button MD 5/5/202112:01 PM

## 2020-03-20 ENCOUNTER — Telehealth: Payer: Self-pay | Admitting: Oncology

## 2020-03-20 ENCOUNTER — Telehealth: Payer: Self-pay

## 2020-03-20 LAB — PROSTATE-SPECIFIC AG, SERUM (LABCORP): Prostate Specific Ag, Serum: 154 ng/mL — ABNORMAL HIGH (ref 0.0–4.0)

## 2020-03-20 NOTE — Telephone Encounter (Signed)
Scheduled appt pe 5/5 los.  Was not able to get in contact with pt.  Left a detailed vm of the appt date and time.

## 2020-03-20 NOTE — Telephone Encounter (Signed)
-----   Message from Wyatt Portela, MD sent at 03/20/2020  8:10 AM EDT ----- Please let him know his PSA is up. No changes for now.

## 2020-03-20 NOTE — Telephone Encounter (Signed)
Called patient and made him aware that per Dr. Alen Blew, PSA is up and no changes for now. Patient verbalized understanding.

## 2020-03-21 ENCOUNTER — Other Ambulatory Visit: Payer: Self-pay

## 2020-03-21 ENCOUNTER — Inpatient Hospital Stay: Payer: Medicare HMO

## 2020-03-21 VITALS — BP 148/72 | HR 72 | Temp 98.2°F | Resp 18

## 2020-03-21 DIAGNOSIS — Z5111 Encounter for antineoplastic chemotherapy: Secondary | ICD-10-CM | POA: Diagnosis not present

## 2020-03-21 DIAGNOSIS — C7951 Secondary malignant neoplasm of bone: Secondary | ICD-10-CM

## 2020-03-21 DIAGNOSIS — C61 Malignant neoplasm of prostate: Secondary | ICD-10-CM

## 2020-03-21 MED ORDER — PEGFILGRASTIM-CBQV 6 MG/0.6ML ~~LOC~~ SOSY
PREFILLED_SYRINGE | SUBCUTANEOUS | Status: AC
  Filled 2020-03-21: qty 0.6

## 2020-03-21 MED ORDER — PEGFILGRASTIM-CBQV 6 MG/0.6ML ~~LOC~~ SOSY
6.0000 mg | PREFILLED_SYRINGE | Freq: Once | SUBCUTANEOUS | Status: AC
  Administered 2020-03-21: 6 mg via SUBCUTANEOUS

## 2020-03-21 NOTE — Patient Instructions (Signed)

## 2020-03-27 ENCOUNTER — Telehealth: Payer: Self-pay

## 2020-03-27 NOTE — Telephone Encounter (Signed)
Called patient and let him know that per Dr. Alen Blew, dental work can be performed just before treatment in June because per Dr. Alen Blew, counts would be good by then. Patient verbalized understanding.

## 2020-03-27 NOTE — Telephone Encounter (Signed)
-----   Message from Wyatt Portela, MD sent at 03/27/2020  3:32 PM EDT ----- He can have dental work done just before his treatment in June. Counts would be good by then.  Thanks ----- Message ----- From: Tami Lin, RN Sent: 03/27/2020   3:24 PM EDT To: Wyatt Portela, MD  Patient called and stated he has a broken tooth. He said he was supposed to have the tooth removed prior to starting Taxotere but didn't because he started chemo urgently. Per patient the tooth is dead but has now split and is broken into two pieces. Patient said it is more annoying than painful and wants to know about having the tooth pulled. Informed patient that dental work is not recommended while on chemo but I told him I would let you know his concern. Per patient his last tx is scheduled in June and he will wait until then to have dental work done if you agree. Lanelle Bal

## 2020-04-08 ENCOUNTER — Other Ambulatory Visit: Payer: Self-pay

## 2020-04-08 DIAGNOSIS — C7951 Secondary malignant neoplasm of bone: Secondary | ICD-10-CM

## 2020-04-08 DIAGNOSIS — C61 Malignant neoplasm of prostate: Secondary | ICD-10-CM

## 2020-04-09 ENCOUNTER — Ambulatory Visit: Payer: Medicare HMO | Admitting: Oncology

## 2020-04-09 ENCOUNTER — Other Ambulatory Visit: Payer: Medicare HMO

## 2020-04-09 ENCOUNTER — Ambulatory Visit: Payer: Medicare HMO

## 2020-04-09 ENCOUNTER — Other Ambulatory Visit: Payer: Self-pay

## 2020-04-09 ENCOUNTER — Inpatient Hospital Stay: Payer: Medicare HMO

## 2020-04-09 DIAGNOSIS — C61 Malignant neoplasm of prostate: Secondary | ICD-10-CM

## 2020-04-09 DIAGNOSIS — C7951 Secondary malignant neoplasm of bone: Secondary | ICD-10-CM

## 2020-04-09 DIAGNOSIS — Z5111 Encounter for antineoplastic chemotherapy: Secondary | ICD-10-CM | POA: Diagnosis not present

## 2020-04-09 LAB — CBC WITH DIFFERENTIAL (CANCER CENTER ONLY)
Abs Immature Granulocytes: 0.04 10*3/uL (ref 0.00–0.07)
Basophils Absolute: 0 10*3/uL (ref 0.0–0.1)
Basophils Relative: 1 %
Eosinophils Absolute: 0 10*3/uL (ref 0.0–0.5)
Eosinophils Relative: 0 %
HCT: 28.9 % — ABNORMAL LOW (ref 39.0–52.0)
Hemoglobin: 9.3 g/dL — ABNORMAL LOW (ref 13.0–17.0)
Immature Granulocytes: 1 %
Lymphocytes Relative: 12 %
Lymphs Abs: 0.5 10*3/uL — ABNORMAL LOW (ref 0.7–4.0)
MCH: 31.6 pg (ref 26.0–34.0)
MCHC: 32.2 g/dL (ref 30.0–36.0)
MCV: 98.3 fL (ref 80.0–100.0)
Monocytes Absolute: 0.2 10*3/uL (ref 0.1–1.0)
Monocytes Relative: 5 %
Neutro Abs: 3.5 10*3/uL (ref 1.7–7.7)
Neutrophils Relative %: 81 %
Platelet Count: 62 10*3/uL — ABNORMAL LOW (ref 150–400)
RBC: 2.94 MIL/uL — ABNORMAL LOW (ref 4.22–5.81)
RDW: 20.1 % — ABNORMAL HIGH (ref 11.5–15.5)
WBC Count: 4.3 10*3/uL (ref 4.0–10.5)
nRBC: 0 % (ref 0.0–0.2)

## 2020-04-09 LAB — TYPE AND SCREEN
ABO/RH(D): B NEG
Antibody Screen: NEGATIVE

## 2020-04-09 LAB — ABO/RH: ABO/RH(D): B NEG

## 2020-04-11 ENCOUNTER — Ambulatory Visit: Payer: Medicare HMO

## 2020-04-15 MED FILL — Dexamethasone Sodium Phosphate Inj 100 MG/10ML: INTRAMUSCULAR | Qty: 1 | Status: AC

## 2020-04-16 ENCOUNTER — Inpatient Hospital Stay: Payer: Medicare HMO

## 2020-04-16 ENCOUNTER — Other Ambulatory Visit: Payer: Self-pay

## 2020-04-16 ENCOUNTER — Inpatient Hospital Stay: Payer: Medicare HMO | Attending: Oncology | Admitting: Oncology

## 2020-04-16 VITALS — BP 149/92 | HR 80 | Temp 97.6°F | Resp 17 | Ht 75.0 in | Wt 330.2 lb

## 2020-04-16 DIAGNOSIS — C7951 Secondary malignant neoplasm of bone: Secondary | ICD-10-CM | POA: Insufficient documentation

## 2020-04-16 DIAGNOSIS — C61 Malignant neoplasm of prostate: Secondary | ICD-10-CM | POA: Insufficient documentation

## 2020-04-16 DIAGNOSIS — Z95828 Presence of other vascular implants and grafts: Secondary | ICD-10-CM | POA: Diagnosis not present

## 2020-04-16 DIAGNOSIS — G629 Polyneuropathy, unspecified: Secondary | ICD-10-CM | POA: Diagnosis not present

## 2020-04-16 DIAGNOSIS — Z79899 Other long term (current) drug therapy: Secondary | ICD-10-CM | POA: Insufficient documentation

## 2020-04-16 DIAGNOSIS — Z9079 Acquired absence of other genital organ(s): Secondary | ICD-10-CM | POA: Insufficient documentation

## 2020-04-16 DIAGNOSIS — D6959 Other secondary thrombocytopenia: Secondary | ICD-10-CM | POA: Diagnosis not present

## 2020-04-16 LAB — CBC WITH DIFFERENTIAL (CANCER CENTER ONLY)
Abs Immature Granulocytes: 0.05 10*3/uL (ref 0.00–0.07)
Basophils Absolute: 0 10*3/uL (ref 0.0–0.1)
Basophils Relative: 0 %
Eosinophils Absolute: 0.1 10*3/uL (ref 0.0–0.5)
Eosinophils Relative: 3 %
HCT: 29.8 % — ABNORMAL LOW (ref 39.0–52.0)
Hemoglobin: 9.4 g/dL — ABNORMAL LOW (ref 13.0–17.0)
Immature Granulocytes: 1 %
Lymphocytes Relative: 10 %
Lymphs Abs: 0.4 10*3/uL — ABNORMAL LOW (ref 0.7–4.0)
MCH: 31 pg (ref 26.0–34.0)
MCHC: 31.5 g/dL (ref 30.0–36.0)
MCV: 98.3 fL (ref 80.0–100.0)
Monocytes Absolute: 0.2 10*3/uL (ref 0.1–1.0)
Monocytes Relative: 4 %
Neutro Abs: 3.3 10*3/uL (ref 1.7–7.7)
Neutrophils Relative %: 82 %
Platelet Count: 110 10*3/uL — ABNORMAL LOW (ref 150–400)
RBC: 3.03 MIL/uL — ABNORMAL LOW (ref 4.22–5.81)
RDW: 18.6 % — ABNORMAL HIGH (ref 11.5–15.5)
WBC Count: 4.1 10*3/uL (ref 4.0–10.5)
nRBC: 0 % (ref 0.0–0.2)

## 2020-04-16 LAB — CMP (CANCER CENTER ONLY)
ALT: 24 U/L (ref 0–44)
AST: 21 U/L (ref 15–41)
Albumin: 3.7 g/dL (ref 3.5–5.0)
Alkaline Phosphatase: 157 U/L — ABNORMAL HIGH (ref 38–126)
Anion gap: 10 (ref 5–15)
BUN: 10 mg/dL (ref 8–23)
CO2: 28 mmol/L (ref 22–32)
Calcium: 8.8 mg/dL — ABNORMAL LOW (ref 8.9–10.3)
Chloride: 102 mmol/L (ref 98–111)
Creatinine: 0.82 mg/dL (ref 0.61–1.24)
GFR, Est AFR Am: >60 mL/min (ref >60)
GFR, Estimated: >60 mL/min (ref >60)
Glucose, Bld: 119 mg/dL — ABNORMAL HIGH (ref 70–99)
Potassium: 4.1 mmol/L (ref 3.5–5.1)
Sodium: 140 mmol/L (ref 135–145)
Total Bilirubin: 0.5 mg/dL (ref 0.3–1.2)
Total Protein: 7 g/dL (ref 6.5–8.1)

## 2020-04-16 MED ORDER — HYDROCODONE-ACETAMINOPHEN 5-325 MG PO TABS: 1.0000 | ORAL_TABLET | Freq: Four times a day (QID) | ORAL | 0 refills | Status: DC | PRN

## 2020-04-16 MED ORDER — SODIUM CHLORIDE 0.9% FLUSH
10.0000 mL | INTRAVENOUS | Status: DC | PRN
  Administered 2020-04-16: 10 mL
  Filled 2020-04-16: qty 10

## 2020-04-16 MED ORDER — HEPARIN SOD (PORK) LOCK FLUSH 100 UNIT/ML IV SOLN
500.0000 [IU] | Freq: Once | INTRAVENOUS | Status: AC | PRN
  Administered 2020-04-16: 500 [IU]
  Filled 2020-04-16: qty 5

## 2020-04-16 MED ORDER — SODIUM CHLORIDE 0.9% FLUSH
10.0000 mL | INTRAVENOUS | Status: DC | PRN
  Filled 2020-04-16: qty 10

## 2020-04-16 NOTE — Progress Notes (Signed)
Hematology and Oncology Follow Up Visit  Gregory Schneider [id number] 1956/03/22 64 y.o. 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  He presented with Gleason score of 4+5 = 9 in 2010.  Prior Therapy:  Combined androgen deprivation with Lupron and Casodex. He had a good response and then developed a rise in the PSA with castrate level testosterone.  He is S/P Bilateral simple orchiectomy done on 12/25/2012.  Provenge started on 03/03/12. Therapy Ended in 03/31/2012. Case Zytiga started in 10/2013. Therapy discontinued in January 2018 because of progression of disease. Xofigo infusion to start on Apr 13, 2018.  He completed 6 months of therapy in November 2019. Status post prostate radiation between December 19, 2018 and January 01, 2019.  He received total of 30 Gy in 10 fractions to the prostate and the pubic rami. Xtandi 160 mg daily started in February 2018.  Therapy discontinued in October 2020 because of progression of disease.   Current therapy: Taxotere chemotherapy started on September 13, 2019.  His dose was reduced by 50% because of cytopenia and better tolerance.  He is here for cycle 10 of therapy.      Interim History:  Gregory Schneider returns today for a follow-up visit.  Since last visit, he has reports a few cumulative complications related to chemotherapy. He has reported increased fatigue, tiredness and occasional dyspnea on exertion. He has denied any nausea, vomiting or abdominal pain. He denies any falls or syncope. He does report increase in his bone pain after growth factor support injection.     Medications: Reviewed without changes. Current Outpatient Medications  Medication Sig Dispense Refill  . amLODipine (NORVASC) 2.5 MG tablet Take 2.5 mg by mouth daily.    Marland Kitchen buPROPion (ZYBAN) 150 MG 12 hr tablet Take 150 mg by mouth.    . calcium carbonate (OS-CAL) 600  MG TABS Take 600 mg by mouth daily.    . cholecalciferol (VITAMIN D) 1000 UNITS tablet Take 1,000 Units by mouth daily.    . clonazePAM (KLONOPIN) 1 MG tablet Take 1 mg by mouth as needed for anxiety (1-2 tablets daily as needed).     . Cyanocobalamin (RA VITAMIN B-12 TR) 1000 MCG TBCR Take by mouth.    . gabapentin (NEURONTIN) 300 MG capsule Take 3 capsules (900 mg total) by mouth 2 (two) times daily. 180 capsule 3  . HYDROcodone-acetaminophen (NORCO/VICODIN) 5-325 MG tablet Take 1-2 tablets by mouth every 6 (six) hours as needed. 90 tablet 0  . lidocaine-prilocaine (EMLA) cream Apply 1 application topically as needed. 30 g 0  . losartan-hydrochlorothiazide (HYZAAR) 100-25 MG tablet   0  . Multiple Vitamin (MULTIVITAMIN) tablet Take 1 tablet by mouth daily.    . prochlorperazine (COMPAZINE) 10 MG tablet Take 1 tablet (10 mg total) by mouth every 6 (six) hours as needed for nausea or vomiting. 30 tablet 3  . sertraline (ZOLOFT) 100 MG tablet Take 200 mg by mouth every morning.     . traZODone (DESYREL) 50 MG tablet TAKE 1 TABLET BY MOUTH EVERY DAY AT NIGHT     No current facility-administered medications for this visit.   Facility-Administered Medications Ordered in Other Visits  Medication Dose Route Frequency Provider Last Rate Last Admin  . sodium chloride flush (NS) 0.9 % injection 10 mL  10 mL Intracatheter PRN Gregory Portela, MD        Allergies:  Allergies  Allergen  Reactions  . Penicillins Other (See Comments)    Patient stated,"I don't remember what kind of reaction because I was a kid."      Physical Exam:   Blood pressure (!) 149/92, pulse 80, temperature 97.6 F (36.4 C), temperature source Temporal, resp. rate 17, height 6\' 3"  (1.905 m), weight (!) 330 lb 3.2 oz (149.8 kg), SpO2 99 %.      ECOG: 1    General appearance: Comfortable appearing without any discomfort Head: Normocephalic without any trauma Oropharynx: Mucous membranes are moist and pink without  any thrush or ulcers. Eyes: Pupils are equal and round reactive to light. Lymph nodes: No cervical, supraclavicular, inguinal or axillary lymphadenopathy.   Heart:regular rate and rhythm.  S1 and S2 without leg edema. Lung: Clear without any rhonchi or wheezes.  No dullness to percussion. Abdomin: Soft, nontender, nondistended with good bowel sounds.  No hepatosplenomegaly. Musculoskeletal: No joint deformity or effusion.  Full range of motion noted. Neurological: No deficits noted on motor, sensory and deep tendon reflex exam. Skin: No petechial rash or dryness.  Appeared moist.  .                 Lab Results: Lab Results  Component Value Date   WBC 4.3 04/09/2020   HGB 9.3 (L) 04/09/2020   HCT 28.9 (L) 04/09/2020   MCV 98.3 04/09/2020   PLT 62 (L) 04/09/2020     Chemistry      Component Value Date/Time   NA 142 03/19/2020 1153   NA 136 11/17/2017 0956   K 4.3 03/19/2020 1153   K 3.8 11/17/2017 0956   CL 104 03/19/2020 1153   CL 104 03/09/2013 0921   CO2 31 03/19/2020 1153   CO2 28 11/17/2017 0956   BUN 15 03/19/2020 1153   BUN 19.7 11/17/2017 0956   CREATININE 0.80 03/19/2020 1153   CREATININE 0.9 11/17/2017 0956      Component Value Date/Time   CALCIUM 8.4 (L) 03/19/2020 1153   CALCIUM 9.5 11/17/2017 0956   ALKPHOS 107 03/19/2020 1153   ALKPHOS 89 11/17/2017 0956   AST 17 03/19/2020 1153   AST 21 11/17/2017 0956   ALT 22 03/19/2020 1153   ALT 47 11/17/2017 0956   BILITOT 0.4 03/19/2020 1153   BILITOT 0.39 11/17/2017 0956            Results for Gregory Schneider, Gregory Schneider (MRN [id number]) as of 04/16/2020 12:01  Ref. Range 02/27/2020 09:10 03/19/2020 11:53  Prostate Specific Ag, Serum Latest Ref Range: 0.0 - 4.0 ng/mL 116.0 (H) 154.0 (H)         Impression and Plan:    64 year old man with  1.  Castration-resistant prostate cancer with bone disease and adenopathy diagnosed in 2013.    He had continues to tolerate chemotherapy without any major  complications.  His PSA had showed modest response and overall slight rise.  Risks and benefits of receiving cycle 10 of chemotherapy were reviewed today.  The plan is to restage him with a CT scan and bone scan in the near future and determine the best course of action afterwards.  Treatment options would be deferred chemotherapy after period of break versus a PARP inhibitor if she harbors the appropriate mutation.  After discussion today, we opted to hold chemotherapy given his cumulative toxicity and use different salvage therapy after treatment break.    2. Androgen deprivation: He status post orchiectomy without any additional androgen deprivation needed.  Testosterone level remains.  3.  Bone  directed therapy: He has not received Zometa in the past and has declined further treatment for the time being.   4.  Prognosis and goals of care: Therapy remains palliative and his disease is incurable.  Aggressive measures are still warranted given his performance status.  5.  Thrombocytopenia: His platelet count remains adequate without any active bleeding noted at this time.  His thrombocytopenia is related to chemotherapy. His platelet count is recovering at this time.   6.  Bone pain: Continues to be on hydrocodone. His pain is manageable and will be refilled today.  7.  IV access: Port-A-Cath remains in place without any issues..  8.  Antiemetics: No nausea or vomiting reported.  He is currently on Compazine.  9.  Growth factor support: He will continue to receive growth factor support after each cycle of chemotherapy.  He is at risk of neutropenia and sepsis.  10.  Neuropathy: Manageable on Neurontin at this time.  11. Follow up: He will return in the next 3 to 4 weeks for repeat evaluation and staging work-up..  30 minutes were spent on this encounter.  The time was dedicated to updating his disease status, reviewing laboratory data, discussing treatment options and future plan of  care.   Zola Button MD 6/2/202111:59 AM

## 2020-04-17 ENCOUNTER — Telehealth: Payer: Self-pay | Admitting: *Deleted

## 2020-04-17 ENCOUNTER — Telehealth: Payer: Self-pay | Admitting: Oncology

## 2020-04-17 LAB — PROSTATE-SPECIFIC AG, SERUM (LABCORP): Prostate Specific Ag, Serum: 233 ng/mL — ABNORMAL HIGH (ref 0.0–4.0)

## 2020-04-17 NOTE — Telephone Encounter (Signed)
On request of Dr. Alen Blew called patient to let him know his PSA had increased.  Dr. Alen Blew ordered scan to be done in couple of weeks and once that is completed at his next follow up with that detail of information plus the lab from yesterday he can anticipate a change of treatment plan.   Patient stated understanding and had no further questions.

## 2020-04-17 NOTE — Telephone Encounter (Signed)
Scheduled appt per 6/2 los.  Left a vm of the appt date and time. 

## 2020-04-18 ENCOUNTER — Ambulatory Visit: Payer: Medicare HMO

## 2020-04-24 DIAGNOSIS — Z79899 Other long term (current) drug therapy: Secondary | ICD-10-CM | POA: Diagnosis not present

## 2020-04-24 DIAGNOSIS — D696 Thrombocytopenia, unspecified: Secondary | ICD-10-CM | POA: Diagnosis not present

## 2020-04-24 DIAGNOSIS — C7951 Secondary malignant neoplasm of bone: Secondary | ICD-10-CM | POA: Diagnosis not present

## 2020-04-24 DIAGNOSIS — C61 Malignant neoplasm of prostate: Secondary | ICD-10-CM | POA: Diagnosis not present

## 2020-04-24 DIAGNOSIS — R739 Hyperglycemia, unspecified: Secondary | ICD-10-CM | POA: Diagnosis not present

## 2020-04-24 DIAGNOSIS — I10 Essential (primary) hypertension: Secondary | ICD-10-CM | POA: Diagnosis not present

## 2020-04-24 DIAGNOSIS — R69 Illness, unspecified: Secondary | ICD-10-CM | POA: Diagnosis not present

## 2020-04-24 DIAGNOSIS — G620 Drug-induced polyneuropathy: Secondary | ICD-10-CM | POA: Diagnosis not present

## 2020-04-29 ENCOUNTER — Encounter: Payer: Self-pay | Admitting: Oncology

## 2020-04-29 ENCOUNTER — Inpatient Hospital Stay: Payer: Medicare HMO

## 2020-04-29 ENCOUNTER — Other Ambulatory Visit: Payer: Self-pay

## 2020-04-29 DIAGNOSIS — G629 Polyneuropathy, unspecified: Secondary | ICD-10-CM | POA: Diagnosis not present

## 2020-04-29 DIAGNOSIS — C61 Malignant neoplasm of prostate: Secondary | ICD-10-CM

## 2020-04-29 DIAGNOSIS — C7951 Secondary malignant neoplasm of bone: Secondary | ICD-10-CM | POA: Diagnosis not present

## 2020-04-29 DIAGNOSIS — D6959 Other secondary thrombocytopenia: Secondary | ICD-10-CM | POA: Diagnosis not present

## 2020-04-29 DIAGNOSIS — Z79899 Other long term (current) drug therapy: Secondary | ICD-10-CM | POA: Diagnosis not present

## 2020-04-29 DIAGNOSIS — Z9079 Acquired absence of other genital organ(s): Secondary | ICD-10-CM | POA: Diagnosis not present

## 2020-04-29 DIAGNOSIS — Z95828 Presence of other vascular implants and grafts: Secondary | ICD-10-CM

## 2020-04-29 LAB — CMP (CANCER CENTER ONLY)
ALT: 21 U/L (ref 0–44)
AST: 18 U/L (ref 15–41)
Albumin: 3.5 g/dL (ref 3.5–5.0)
Alkaline Phosphatase: 210 U/L — ABNORMAL HIGH (ref 38–126)
Anion gap: 9 (ref 5–15)
BUN: 16 mg/dL (ref 8–23)
CO2: 30 mmol/L (ref 22–32)
Calcium: 8.5 mg/dL — ABNORMAL LOW (ref 8.9–10.3)
Chloride: 103 mmol/L (ref 98–111)
Creatinine: 0.92 mg/dL (ref 0.61–1.24)
GFR, Est AFR Am: >60 mL/min (ref >60)
GFR, Estimated: >60 mL/min (ref >60)
Glucose, Bld: 148 mg/dL — ABNORMAL HIGH (ref 70–99)
Potassium: 3.6 mmol/L (ref 3.5–5.1)
Sodium: 142 mmol/L (ref 135–145)
Total Bilirubin: 0.4 mg/dL (ref 0.3–1.2)
Total Protein: 6.7 g/dL (ref 6.5–8.1)

## 2020-04-29 LAB — CBC WITH DIFFERENTIAL (CANCER CENTER ONLY)
Abs Immature Granulocytes: 0.03 10*3/uL (ref 0.00–0.07)
Basophils Absolute: 0 10*3/uL (ref 0.0–0.1)
Basophils Relative: 0 %
Eosinophils Absolute: 0.1 10*3/uL (ref 0.0–0.5)
Eosinophils Relative: 2 %
HCT: 28.4 % — ABNORMAL LOW (ref 39.0–52.0)
Hemoglobin: 8.9 g/dL — ABNORMAL LOW (ref 13.0–17.0)
Immature Granulocytes: 1 %
Lymphocytes Relative: 10 %
Lymphs Abs: 0.4 10*3/uL — ABNORMAL LOW (ref 0.7–4.0)
MCH: 29.9 pg (ref 26.0–34.0)
MCHC: 31.3 g/dL (ref 30.0–36.0)
MCV: 95.3 fL (ref 80.0–100.0)
Monocytes Absolute: 0.2 10*3/uL (ref 0.1–1.0)
Monocytes Relative: 5 %
Neutro Abs: 2.8 10*3/uL (ref 1.7–7.7)
Neutrophils Relative %: 82 %
Platelet Count: 108 10*3/uL — ABNORMAL LOW (ref 150–400)
RBC: 2.98 MIL/uL — ABNORMAL LOW (ref 4.22–5.81)
RDW: 17.6 % — ABNORMAL HIGH (ref 11.5–15.5)
WBC Count: 3.5 10*3/uL — ABNORMAL LOW (ref 4.0–10.5)
nRBC: 0 % (ref 0.0–0.2)

## 2020-04-29 MED ORDER — HEPARIN SOD (PORK) LOCK FLUSH 100 UNIT/ML IV SOLN
500.0000 [IU] | Freq: Once | INTRAVENOUS | Status: AC | PRN
  Administered 2020-04-29: 500 [IU]
  Filled 2020-04-29: qty 5

## 2020-04-29 MED ORDER — SODIUM CHLORIDE 0.9% FLUSH
10.0000 mL | INTRAVENOUS | Status: DC | PRN
  Administered 2020-04-29: 10 mL
  Filled 2020-04-29: qty 10

## 2020-04-30 LAB — PROSTATE-SPECIFIC AG, SERUM (LABCORP): Prostate Specific Ag, Serum: 303 ng/mL — ABNORMAL HIGH (ref 0.0–4.0)

## 2020-05-05 DIAGNOSIS — C61 Malignant neoplasm of prostate: Secondary | ICD-10-CM | POA: Diagnosis not present

## 2020-05-07 ENCOUNTER — Other Ambulatory Visit: Payer: Self-pay

## 2020-05-07 ENCOUNTER — Ambulatory Visit (HOSPITAL_COMMUNITY)
Admission: RE | Admit: 2020-05-07 | Discharge: 2020-05-07 | Disposition: A | Discharge status: Home / Self Care | Payer: Medicare HMO | Source: Ambulatory Visit | Attending: Oncology | Admitting: Oncology

## 2020-05-07 ENCOUNTER — Encounter (HOSPITAL_COMMUNITY)
Admission: RE | Admit: 2020-05-07 | Discharge: 2020-05-07 | Disposition: A | Discharge status: Home / Self Care | Payer: Medicare HMO | Source: Ambulatory Visit | Attending: Oncology | Admitting: Oncology

## 2020-05-07 DIAGNOSIS — C61 Malignant neoplasm of prostate: Secondary | ICD-10-CM | POA: Diagnosis not present

## 2020-05-07 DIAGNOSIS — C7951 Secondary malignant neoplasm of bone: Secondary | ICD-10-CM | POA: Diagnosis not present

## 2020-05-07 DIAGNOSIS — C787 Secondary malignant neoplasm of liver and intrahepatic bile duct: Secondary | ICD-10-CM | POA: Diagnosis not present

## 2020-05-07 MED ORDER — IOHEXOL 300 MG/ML  SOLN
100.0000 mL | Freq: Once | INTRAMUSCULAR | Status: AC | PRN
  Administered 2020-05-07: 100 mL via INTRAVENOUS

## 2020-05-07 MED ORDER — TECHNETIUM TC 99M MEDRONATE IV KIT
21.4000 | PACK | Freq: Once | INTRAVENOUS | Status: AC | PRN
  Administered 2020-05-07: 21.4 via INTRAVENOUS

## 2020-05-07 MED ORDER — HEPARIN SOD (PORK) LOCK FLUSH 100 UNIT/ML IV SOLN
500.0000 [IU] | Freq: Once | INTRAVENOUS | Status: AC
  Administered 2020-05-07: 500 [IU] via INTRAVENOUS

## 2020-05-07 MED ORDER — HEPARIN SOD (PORK) LOCK FLUSH 100 UNIT/ML IV SOLN
INTRAVENOUS | Status: AC
  Filled 2020-05-07: qty 5

## 2020-05-07 MED ORDER — SODIUM CHLORIDE (PF) 0.9 % IJ SOLN
INTRAMUSCULAR | Status: AC
  Filled 2020-05-07: qty 50

## 2020-05-14 ENCOUNTER — Other Ambulatory Visit: Payer: Self-pay

## 2020-05-14 ENCOUNTER — Inpatient Hospital Stay (HOSPITAL_BASED_OUTPATIENT_CLINIC_OR_DEPARTMENT_OTHER): Payer: Medicare HMO | Admitting: Oncology

## 2020-05-14 VITALS — BP 143/78 | HR 85 | Temp 97.8°F | Resp 18 | Ht 75.0 in | Wt 326.4 lb

## 2020-05-14 DIAGNOSIS — G629 Polyneuropathy, unspecified: Secondary | ICD-10-CM | POA: Diagnosis not present

## 2020-05-14 DIAGNOSIS — C61 Malignant neoplasm of prostate: Secondary | ICD-10-CM | POA: Diagnosis not present

## 2020-05-14 DIAGNOSIS — Z9079 Acquired absence of other genital organ(s): Secondary | ICD-10-CM | POA: Diagnosis not present

## 2020-05-14 DIAGNOSIS — Z79899 Other long term (current) drug therapy: Secondary | ICD-10-CM | POA: Diagnosis not present

## 2020-05-14 DIAGNOSIS — C7951 Secondary malignant neoplasm of bone: Secondary | ICD-10-CM

## 2020-05-14 DIAGNOSIS — D6959 Other secondary thrombocytopenia: Secondary | ICD-10-CM | POA: Diagnosis not present

## 2020-05-14 NOTE — Progress Notes (Signed)
Hematology and Oncology Follow Up Visit  Gregory Schneider [id number] May 17, 1956 64 y.o. 05/14/2020 3:49 PM   Principle Diagnosis: 64 year old man with advanced prostate cancer diagnosed in 2013.  He was found to have castration-resistant,Gleason score of 4+5 = 9 with initial diagnosis in 2010.  Prior Therapy:  Combined androgen deprivation with Lupron and Casodex. He had a good response and then developed a rise in the PSA with castrate level testosterone.  He is S/P Bilateral simple orchiectomy done on 12/25/2012.  Provenge started on 03/03/12. Therapy Ended in 03/31/2012. Case Zytiga started in 10/2013. Therapy discontinued in January 2018 because of progression of disease. Xofigo infusion to start on Apr 13, 2018.  He completed 6 months of therapy in November 2019. Status post prostate radiation between December 19, 2018 and January 01, 2019.  He received total of 30 Gy in 10 fractions to the prostate and the pubic rami. Xtandi 160 mg daily started in February 2018.  Therapy discontinued in October 2020 because of progression of disease.  Taxotere chemotherapy started on September 13, 2019. He completed 9 cycles of therapy in May 2021.   Current therapy: Under consideration for different salvage therapy.      Interim History:  Gregory Schneider presents today for return evaluation.  Since the last visit, he reports no major changes in his health.  He continues to have diffuse bone pain predominantly in the upper part of his body.  He has reported pain in his hips back and shoulders.  He denies any recent falls or syncope but his ambulation has been limited.  He denies any recent hospitalization or illnesses.  His appetite has been marginal although has not been a lot of weight.     Medications: Unchanged on review. Current Outpatient Medications  Medication Sig Dispense Refill  . amLODipine (NORVASC) 2.5 MG tablet Take 2.5 mg by mouth daily.    Marland Kitchen buPROPion (ZYBAN) 150 MG 12 hr tablet Take 150  mg by mouth.    . calcium carbonate (OS-CAL) 600 MG TABS Take 600 mg by mouth daily.    . cholecalciferol (VITAMIN D) 1000 UNITS tablet Take 1,000 Units by mouth daily.    . clonazePAM (KLONOPIN) 1 MG tablet Take 1 mg by mouth as needed for anxiety (1-2 tablets daily as needed).     . Cyanocobalamin (RA VITAMIN B-12 TR) 1000 MCG TBCR Take by mouth.    . gabapentin (NEURONTIN) 300 MG capsule Take 3 capsules (900 mg total) by mouth 2 (two) times daily. 180 capsule 3  . HYDROcodone-acetaminophen (NORCO/VICODIN) 5-325 MG tablet Take 1-2 tablets by mouth every 6 (six) hours as needed. 90 tablet 0  . lidocaine-prilocaine (EMLA) cream Apply 1 application topically as needed. 30 g 0  . losartan-hydrochlorothiazide (HYZAAR) 100-25 MG tablet   0  . Multiple Vitamin (MULTIVITAMIN) tablet Take 1 tablet by mouth daily.    . prochlorperazine (COMPAZINE) 10 MG tablet Take 1 tablet (10 mg total) by mouth every 6 (six) hours as needed for nausea or vomiting. 30 tablet 3  . sertraline (ZOLOFT) 100 MG tablet Take 200 mg by mouth every morning.     . traZODone (DESYREL) 50 MG tablet TAKE 1 TABLET BY MOUTH EVERY DAY AT NIGHT     No current facility-administered medications for this visit.    Allergies:  Allergies  Allergen Reactions  . Penicillins Other (See Comments)    Patient stated,"I don't remember what kind of reaction because I was a kid."  Physical Exam:     Blood pressure (!) 143/78, pulse 85, temperature 97.8 F (36.6 C), temperature source Temporal, resp. rate 18, height 6\' 3"  (1.905 m), weight (!) 326 lb 6.4 oz (148.1 kg), SpO2 98 %.     ECOG: 1    General appearance: Alert, awake without any distress. Head: Atraumatic without abnormalities Oropharynx: Without any thrush or ulcers. Eyes: No scleral icterus. Lymph nodes: No lymphadenopathy noted in the cervical, supraclavicular, or axillary nodes Heart:regular rate and rhythm, without any murmurs or gallops.   Lung: Clear to  auscultation without any rhonchi, wheezes or dullness to percussion. Abdomin: Soft, nontender without any shifting dullness or ascites. Musculoskeletal: No clubbing or cyanosis. Neurological: No motor or sensory deficits. Skin: No rashes or lesions.   .                 Lab Results: Lab Results  Component Value Date   WBC 3.5 (L) 04/29/2020   HGB 8.9 (L) 04/29/2020   HCT 28.4 (L) 04/29/2020   MCV 95.3 04/29/2020   PLT 108 (L) 04/29/2020     Chemistry      Component Value Date/Time   NA 142 04/29/2020 1102   NA 136 11/17/2017 0956   K 3.6 04/29/2020 1102   K 3.8 11/17/2017 0956   CL 103 04/29/2020 1102   CL 104 03/09/2013 0921   CO2 30 04/29/2020 1102   CO2 28 11/17/2017 0956   BUN 16 04/29/2020 1102   BUN 19.7 11/17/2017 0956   CREATININE 0.92 04/29/2020 1102   CREATININE 0.9 11/17/2017 0956      Component Value Date/Time   CALCIUM 8.5 (L) 04/29/2020 1102   CALCIUM 9.5 11/17/2017 0956   ALKPHOS 210 (H) 04/29/2020 1102   ALKPHOS 89 11/17/2017 0956   AST 18 04/29/2020 1102   AST 21 11/17/2017 0956   ALT 21 04/29/2020 1102   ALT 47 11/17/2017 0956   BILITOT 0.4 04/29/2020 1102   BILITOT 0.39 11/17/2017 0956        Results for Gregory Schneider (MRN [id number]) as of 05/14/2020 15:52  Ref. Range 04/16/2020 11:55 04/29/2020 11:02  Prostate Specific Ag, Serum Latest Ref Range: 0.0 - 4.0 ng/mL 233.0 (H) 303.0 (H)      IMPRESSION: 1. Unchanged widespread sclerotic osseous metastatic disease. 2. Stable hypodense hepatic lesions. Given that these were new on 08/28/2019, hepatic metastatic disease is a likely cause, but there has been no interval progression since that time. 3. Stable 7 by 5 mm right lower lobe pulmonary nodule. 4. Prominent stool throughout the colon favors constipation. 5. Sigmoid colon diverticulosis. 6. Fatty left spermatic cord probably from indirect inguinal hernia containing adipose tissue. There is stranding along  the retroperitoneum and left pelvis extending towards this small hernia, minimally increased from the prior exam. 7. New presacral edema, nonspecific. 8. Borderline wall thickening in the urinary bladder diffusely, cystitis not excluded. Correlate with urine analysis. 9. Stable mild splenomegaly. 10. Aortic atherosclerosis.    FINDINGS: Notably accentuated activity in the humeral heads and around the shoulders, similar to prior. Accentuated sternoclavicular activity.  Multifocal abnormal osseous activity in the sternum, ribs, thoracic spine, lumbar spine, bony pelvis, right greater than left proximal femur, left distal femoral shaft, and right ankle region with an overall similar distribution to previous, compatible with widespread osseous metastatic disease, but without significant change.  IMPRESSION: 1. Stable scattered distribution of osseous metastatic lesions.     Impression and Plan:    64 year old man with  1.  Advanced prostate cancer with disease to the bone diagnosed in 2013.  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    He is status post therapy outlined above including Taxotere chemotherapy concluded in May 2021.  Staging work-up including CT scan and bone scan obtained on 05/07/2020 was personally reviewed and showed overall stable disease PSA is rising.  Treatment options moving forward were discussed these would include retreatment with chemotherapy utilizing Jevtana or PARP inhibitor if he harbors appropriate mutation.  His guardant 360 analysis remains pending at this point and that depending on these results will dictate his next line of therapy.  After discussion today, we opted to continue with active surveillance given his overall fatigue and debilitation and will await results of next generation sequencing.  2. Androgen deprivation: No additional androgen deprivation therapy is needed.  3.  Bone directed therapy: He has been on Zometa in the past and  declined any additional treatment.   4.  Prognosis and goals of care: His disease is incurable and any therapy is palliative at this time.  5.  Thrombocytopenia: Improving at this time after discontinuation of chemotherapy.   6.  Bone pain: Manageable with hydrocodone which were refilled for him.  7.  IV access: No issues reported with his Port-A-Cath at this time.  8. Follow up: Will be the next few weeks to follow his progress.  30 minutes were dedicated to this visit.  Time was spent on reviewing imaging studies, reviewing laboratory data, discussing treatment options and addressing complications related to his cancer.  Zola Button MD 6/30/20213:49 PM

## 2020-05-15 ENCOUNTER — Telehealth: Payer: Self-pay | Admitting: Oncology

## 2020-05-15 NOTE — Telephone Encounter (Signed)
Scheduled per 06/30 los, patient has been called and notified. 

## 2020-05-16 MED ORDER — HYDROCODONE-ACETAMINOPHEN 5-325 MG PO TABS: 1.0000 | ORAL_TABLET | Freq: Four times a day (QID) | ORAL | 0 refills | Status: DC | PRN

## 2020-05-16 NOTE — Addendum Note (Signed)
Addended by: Wyatt Portela on: 05/16/2020 11:12 AM   Modules accepted: Orders

## 2020-06-05 ENCOUNTER — Inpatient Hospital Stay: Payer: Medicare HMO | Attending: Oncology

## 2020-06-05 ENCOUNTER — Other Ambulatory Visit: Payer: Self-pay

## 2020-06-05 ENCOUNTER — Telehealth: Payer: Self-pay

## 2020-06-05 ENCOUNTER — Inpatient Hospital Stay (HOSPITAL_BASED_OUTPATIENT_CLINIC_OR_DEPARTMENT_OTHER): Payer: Medicare HMO | Admitting: Oncology

## 2020-06-05 ENCOUNTER — Telehealth: Payer: Self-pay | Admitting: Pharmacist

## 2020-06-05 ENCOUNTER — Inpatient Hospital Stay: Payer: Medicare HMO

## 2020-06-05 VITALS — BP 139/78 | HR 98 | Temp 97.2°F | Resp 18 | Ht 75.0 in | Wt 319.2 lb

## 2020-06-05 DIAGNOSIS — Z95828 Presence of other vascular implants and grafts: Secondary | ICD-10-CM

## 2020-06-05 DIAGNOSIS — D630 Anemia in neoplastic disease: Secondary | ICD-10-CM | POA: Insufficient documentation

## 2020-06-05 DIAGNOSIS — C7951 Secondary malignant neoplasm of bone: Secondary | ICD-10-CM | POA: Diagnosis not present

## 2020-06-05 DIAGNOSIS — Z452 Encounter for adjustment and management of vascular access device: Secondary | ICD-10-CM | POA: Insufficient documentation

## 2020-06-05 DIAGNOSIS — D6959 Other secondary thrombocytopenia: Secondary | ICD-10-CM | POA: Insufficient documentation

## 2020-06-05 DIAGNOSIS — C61 Malignant neoplasm of prostate: Secondary | ICD-10-CM

## 2020-06-05 LAB — CBC WITH DIFFERENTIAL (CANCER CENTER ONLY)
Abs Immature Granulocytes: 0.02 K/uL (ref 0.00–0.07)
Basophils Absolute: 0 K/uL (ref 0.0–0.1)
Basophils Relative: 0 %
Eosinophils Absolute: 0.1 K/uL (ref 0.0–0.5)
Eosinophils Relative: 1 %
HCT: 25.9 % — ABNORMAL LOW (ref 39.0–52.0)
Hemoglobin: 8 g/dL — ABNORMAL LOW (ref 13.0–17.0)
Immature Granulocytes: 1 %
Lymphocytes Relative: 8 %
Lymphs Abs: 0.3 K/uL — ABNORMAL LOW (ref 0.7–4.0)
MCH: 27.5 pg (ref 26.0–34.0)
MCHC: 30.9 g/dL (ref 30.0–36.0)
MCV: 89 fL (ref 80.0–100.0)
Monocytes Absolute: 0.2 K/uL (ref 0.1–1.0)
Monocytes Relative: 5 %
Neutro Abs: 3.2 K/uL (ref 1.7–7.7)
Neutrophils Relative %: 85 %
Platelet Count: 121 K/uL — ABNORMAL LOW (ref 150–400)
RBC: 2.91 MIL/uL — ABNORMAL LOW (ref 4.22–5.81)
RDW: 17.3 % — ABNORMAL HIGH (ref 11.5–15.5)
WBC Count: 3.8 K/uL — ABNORMAL LOW (ref 4.0–10.5)
nRBC: 0 % (ref 0.0–0.2)

## 2020-06-05 LAB — CMP (CANCER CENTER ONLY)
ALT: 33 U/L (ref 0–44)
AST: 21 U/L (ref 15–41)
Albumin: 3.6 g/dL (ref 3.5–5.0)
Alkaline Phosphatase: 260 U/L — ABNORMAL HIGH (ref 38–126)
Anion gap: 11 (ref 5–15)
BUN: 15 mg/dL (ref 8–23)
CO2: 27 mmol/L (ref 22–32)
Calcium: 8.5 mg/dL — ABNORMAL LOW (ref 8.9–10.3)
Chloride: 99 mmol/L (ref 98–111)
Creatinine: 0.87 mg/dL (ref 0.61–1.24)
GFR, Est AFR Am: >60 mL/min (ref >60)
GFR, Estimated: >60 mL/min (ref >60)
Glucose, Bld: 140 mg/dL — ABNORMAL HIGH (ref 70–99)
Potassium: 3.8 mmol/L (ref 3.5–5.1)
Sodium: 137 mmol/L (ref 135–145)
Total Bilirubin: 0.8 mg/dL (ref 0.3–1.2)
Total Protein: 7.5 g/dL (ref 6.5–8.1)

## 2020-06-05 MED ORDER — SODIUM CHLORIDE 0.9% FLUSH
10.0000 mL | INTRAVENOUS | Status: DC | PRN
  Administered 2020-06-05: 10 mL
  Filled 2020-06-05: qty 10

## 2020-06-05 MED ORDER — TRAMETINIB DIMETHYL SULFOXIDE 2 MG PO TABS: 2.0000 mg | ORAL_TABLET | Freq: Every day | ORAL | 0 refills | Status: AC

## 2020-06-05 MED ORDER — DABRAFENIB MESYLATE 75 MG PO CAPS: 150.0000 mg | ORAL_CAPSULE | Freq: Two times a day (BID) | ORAL | 0 refills | Status: AC

## 2020-06-05 MED ORDER — HEPARIN SOD (PORK) LOCK FLUSH 100 UNIT/ML IV SOLN
500.0000 [IU] | Freq: Once | INTRAVENOUS | Status: AC | PRN
  Administered 2020-06-05: 500 [IU]
  Filled 2020-06-05: qty 5

## 2020-06-05 NOTE — Telephone Encounter (Signed)
Error

## 2020-06-05 NOTE — Telephone Encounter (Signed)
Oral Oncology Patient Advocate Encounter  Received notification from [place] that prior authorization for [place] and [employer] is required.  PA submitted on CoverMyMeds Key [place] [id number]        [facility] [id number]  Status is pending  Oral Oncology Clinic will continue to follow.   [place] [facility] [place] Phone [phone] Fax [phone] 06/05/2020 4:12 PM

## 2020-06-05 NOTE — Progress Notes (Signed)
Hematology and Oncology Follow Up Visit  Gregory Schneider [id number] 03-01-1956 64 y.o. 06/05/2020 3:03 PM   Principle Diagnosis: 64 year old man with castration-resistant prostate cancer diagnosed in 2013.  His initial diagnosis in 2010 where he was found to have Gleason score of 4+5 = 9 .  Guardant 360 analysis showed his tumor harbors BRAF and EGFR amplification.  Prior Therapy:  Combined androgen deprivation with Lupron and Casodex. He had a good response and then developed a rise in the PSA with castrate level testosterone.  He is S/P Bilateral simple orchiectomy done on 12/25/2012.  Provenge started on 03/03/12. Therapy Ended in 03/31/2012. Case Zytiga started in 10/2013. Therapy discontinued in January 2018 because of progression of disease. Xofigo infusion to start on Apr 13, 2018.  He completed 6 months of therapy in November 2019. Status post prostate radiation between December 19, 2018 and January 01, 2019.  He received total of 30 Gy in 10 fractions to the prostate and the pubic rami. Xtandi 160 mg daily started in February 2018.  Therapy discontinued in October 2020 because of progression of disease.  Taxotere chemotherapy started on September 13, 2019. He completed 9 cycles of therapy in May 2021.   Current therapy: Under consideration for different salvage therapy.      Interim History:  Gregory Schneider returns today for a follow-up.  Since the last visit, he reports no major changes in his health.  He continues to notice slow decline in his overall performance status and quality of life.  He is ambulating better since the discontinuation of chemotherapy although mobility remains limited.  He is able to ambulate with the help of cane today without any falls or syncope.  He is eating better although his weight has been stable.  He continues to require hydrocodone to control his generalized pain.     Medications: Updated on review. Current Outpatient Medications  Medication Sig  Dispense Refill  . amLODipine (NORVASC) 2.5 MG tablet Take 2.5 mg by mouth daily.    Marland Kitchen buPROPion (ZYBAN) 150 MG 12 hr tablet Take 150 mg by mouth.    . calcium carbonate (OS-CAL) 600 MG TABS Take 600 mg by mouth daily.    . cholecalciferol (VITAMIN D) 1000 UNITS tablet Take 1,000 Units by mouth daily.    . clonazePAM (KLONOPIN) 1 MG tablet Take 1 mg by mouth as needed for anxiety (1-2 tablets daily as needed).     . Cyanocobalamin (RA VITAMIN B-12 TR) 1000 MCG TBCR Take by mouth.    . gabapentin (NEURONTIN) 300 MG capsule Take 3 capsules (900 mg total) by mouth 2 (two) times daily. 180 capsule 3  . HYDROcodone-acetaminophen (NORCO/VICODIN) 5-325 MG tablet Take 1-2 tablets by mouth every 6 (six) hours as needed. 90 tablet 0  . lidocaine-prilocaine (EMLA) cream Apply 1 application topically as needed. 30 g 0  . losartan-hydrochlorothiazide (HYZAAR) 100-25 MG tablet   0  . Multiple Vitamin (MULTIVITAMIN) tablet Take 1 tablet by mouth daily.    . prochlorperazine (COMPAZINE) 10 MG tablet Take 1 tablet (10 mg total) by mouth every 6 (six) hours as needed for nausea or vomiting. 30 tablet 3  . sertraline (ZOLOFT) 100 MG tablet Take 200 mg by mouth every morning.     . traZODone (DESYREL) 50 MG tablet TAKE 1 TABLET BY MOUTH EVERY DAY AT NIGHT     No current facility-administered medications for this visit.   Facility-Administered Medications Ordered in Other Visits  Medication Dose Route Frequency Provider Last  Rate Last Admin  . sodium chloride flush (NS) 0.9 % injection 10 mL  10 mL Intracatheter PRN Wyatt Portela, MD   10 mL at 06/05/20 1455    Allergies:  Allergies  Allergen Reactions  . Penicillins Other (See Comments)    Patient stated,"I don't remember what kind of reaction because I was a kid."      Physical Exam:     Blood pressure (!) 139/78, pulse 98, temperature (!) 97.2 F (36.2 C), temperature source Temporal, resp. rate 18, height _0  (1.905 m), weight (!) 319 lb 3.2  oz (144.8 kg), SpO2 96 %.      ECOG: 1    General appearance: Comfortable appearing without any discomfort Head: Normocephalic without any trauma Oropharynx: Mucous membranes are moist and pink without any thrush or ulcers. Eyes: Pupils are equal and round reactive to light. Lymph nodes: No cervical, supraclavicular, inguinal or axillary lymphadenopathy.   Heart:regular rate and rhythm.  S1 and S2 without leg edema. Lung: Clear without any rhonchi or wheezes.  No dullness to percussion. Abdomin: Soft, nontender, nondistended with good bowel sounds.  No hepatosplenomegaly. Musculoskeletal: No joint deformity or effusion.  Full range of motion noted. Neurological: No deficits noted on motor, sensory and deep tendon reflex exam. Skin: No petechial rash or dryness.  Appeared moist.      .                 Lab Results: Lab Results  Component Value Date   WBC 3.5 (L) 04/29/2020   HGB 8.9 (L) 04/29/2020   HCT 28.4 (L) 04/29/2020   MCV 95.3 04/29/2020   PLT 108 (L) 04/29/2020     Chemistry      Component Value Date/Time   NA 142 04/29/2020 1102   NA 136 11/17/2017 0956   K 3.6 04/29/2020 1102   K 3.8 11/17/2017 0956   CL 103 04/29/2020 1102   CL 104 03/09/2013 0921   CO2 30 04/29/2020 1102   CO2 28 11/17/2017 0956   BUN 16 04/29/2020 1102   BUN 19.7 11/17/2017 0956   CREATININE 0.92 04/29/2020 1102   CREATININE 0.9 11/17/2017 0956      Component Value Date/Time   CALCIUM 8.5 (L) 04/29/2020 1102   CALCIUM 9.5 11/17/2017 0956   ALKPHOS 210 (H) 04/29/2020 1102   ALKPHOS 89 11/17/2017 0956   AST 18 04/29/2020 1102   AST 21 11/17/2017 0956   ALT 21 04/29/2020 1102   ALT 47 11/17/2017 0956   BILITOT 0.4 04/29/2020 1102   BILITOT 0.39 11/17/2017 0956        IMPRESSION: 1. Unchanged widespread sclerotic osseous metastatic disease. 2. Stable hypodense hepatic lesions. Given that these were new on 08/28/2019, hepatic metastatic disease is a likely  cause, but there has been no interval progression since that time. 3. Stable 7 by 5 mm right lower lobe pulmonary nodule. 4. Prominent stool throughout the colon favors constipation. 5. Sigmoid colon diverticulosis. 6. Fatty left spermatic cord probably from indirect inguinal hernia containing adipose tissue. There is stranding along the retroperitoneum and left pelvis extending towards this small hernia, minimally increased from the prior exam. 7. New presacral edema, nonspecific. 8. Borderline wall thickening in the urinary bladder diffusely, cystitis not excluded. Correlate with urine analysis. 9. Stable mild splenomegaly. 10. Aortic atherosclerosis.    FINDINGS: Notably accentuated activity in the humeral heads and around the shoulders, similar to prior. Accentuated sternoclavicular activity.  Multifocal abnormal osseous activity in the sternum,  ribs, thoracic spine, lumbar spine, bony pelvis, right greater than left proximal femur, left distal femoral shaft, and right ankle region with an overall similar distribution to previous, compatible with widespread osseous metastatic disease, but without significant change.  IMPRESSION: 1. Stable scattered distribution of osseous metastatic lesions.     Impression and Plan:    64 year old man with  1.  Castration-resistant prostate cancer with disease to the bone.    He is status post therapy outlined above and has increase in his disease burden by PSA criteria although imaging studies in June 2021 did not show any clear-cut progression.  Guardant 360 analysis obtained in June 2021 showed BRAF amplification that is high with a EGFR amplification that is low.  Treatment options moving forward were discussed at this time.  These include systemic chemotherapy versus off label therapy targeting his BRAF mutation.  Regimen such as to today Trametinib with dabrafenib were discussed.  Complications include nausea, fatigue, fever and  skin cancer were reviewed.  He is not willing to try at this time and will start him on dabrafenib 150 mg twice a day with trametinib 2 mg daily.   2. Androgen deprivation: He is status post orchiectomy without additional managed patient noted.   3.  Anemia: Related to his malignancy and previous chemotherapy.  No need for growth factor support or transfusion at this time.  4.  Prognosis and goals of care: Therapy remains palliative and he understands if his performance status does not improve when he continues to progress, likely will require hospice in the future.  5.  Thrombocytopenia: Related to chemotherapy and improving at this time.   6.  Bone pain: Hydrocodone will be refilled for him which is managing his pain at this time.  7.  IV access: Port-A-Cath will be flushed periodically with every visit.  8. Follow up: He will return in 4 to 6 weeks for repeat evaluation.  30 minutes were spent on reviewing his disease status, discussing treatment options, addressing complications with therapy and future plan of care review.  Zola Button MD 7/22/20213:03 PM

## 2020-06-05 NOTE — Telephone Encounter (Signed)
Oral Oncology Pharmacist Encounter  Received new prescription for Mekinist (trametinib) and Tafinlar (dabrafenib) for the treatment of metastatic CRPC BRAF mutation positive, planned duration until disease progression or unacceptable drug toxicity. This would be off label use.  Recommend baseline ECHO for EF monitoring. Prescription dose and frequency assessed.   Current medication list in Epic reviewed, several DDIs with dabrafenib identified: -Dabrafenib may decrease effectiveness of bupropion, hydrocodone, sertraline, losartan, trazodone. No baseline dose adjustment needed. Monitor patient for decreased effective of the listed medications. -Dabrafenib may increase the concentration of  Prescription has been e-scribed to the [facility]. Both medication denied by insurance decision will be appealed.   Oral Oncology Clinic will continue to follow for insurance appeal, copayment issues, initial counseling and start date.  Darl Pikes, PharmD, BCPS, BCOP, CPP Hematology/Oncology Clinical Pharmacist Practitioner [facility] [phone]  06/05/2020 4:07 PM

## 2020-06-06 LAB — PROSTATE-SPECIFIC AG, SERUM (LABCORP): Prostate Specific Ag, Serum: 445 ng/mL — ABNORMAL HIGH (ref 0.0–4.0)

## 2020-06-12 LAB — GUARDANT 360

## 2020-06-16 ENCOUNTER — Other Ambulatory Visit: Payer: Self-pay | Admitting: Oncology

## 2020-06-16 DIAGNOSIS — C7951 Secondary malignant neoplasm of bone: Secondary | ICD-10-CM

## 2020-06-16 DIAGNOSIS — C61 Malignant neoplasm of prostate: Secondary | ICD-10-CM

## 2020-06-16 MED ORDER — HYDROCODONE-ACETAMINOPHEN 5-325 MG PO TABS: 1.0000 | ORAL_TABLET | Freq: Four times a day (QID) | ORAL | 0 refills | Status: DC | PRN

## 2020-06-16 NOTE — Telephone Encounter (Signed)
Oral Chemotherapy Pharmacist Encounter   Mekinist and Tafinlar denied by the insurance. Appeal faxed in on 06/13/20.   Darl Pikes, PharmD, BCPS, BCOP, CPP Hematology/Oncology Clinical Pharmacist [facility] [phone]  06/16/2020 9:35 AM

## 2020-06-17 ENCOUNTER — Telehealth: Payer: Self-pay

## 2020-06-17 NOTE — Telephone Encounter (Signed)
Oral Oncology Patient Advocate Encounter  Met patient in [place] to complete an application for [employer] Patient [place] ([employer]PA[employer]) in an effort to reduce the patient's out of pocket expense for Mekinist and Tafinlar to $0.    Application completed and faxed to [phone].   [employer] phone number for follow up is [phone].   This encounter will be updated until final determination.    [place] [facility] [place] Phone [phone] Fax [phone] 06/17/2020 2:29 PM

## 2020-06-25 NOTE — Telephone Encounter (Addendum)
Oral Oncology Pharmacist Encounter   Received notification that patient is approved for Mekinist and Scientist, research (physical sciences) through [place].   Patient ID: [id number] Effective dates: 06/25/2020 through 11/14/2020   Mekinist and [place] will be filled through [facility] by Luanne Bras, PharmD, BCPS Hematology/Oncology Clinical Pharmacist [facility] [phone] 06/25/2020 2:54 PM

## 2020-07-08 ENCOUNTER — Telehealth: Payer: Self-pay

## 2020-07-08 NOTE — Telephone Encounter (Signed)
-----   Message from Wyatt Portela, MD sent at 07/08/2020  2:41 PM EDT ----- Noted. Thanks ----- Message ----- From: Tami Lin, RN Sent: 07/08/2020   1:49 PM EDT To: Wyatt Portela, MD  Patient has an appointment to see you on Thursday. He said he wanted to give you a heads up that his shortness of breath has increased to the point where he can't do any activity without getting short of breath. He said with the heat and humidity he can barely stand up without getting dizzy. He said he has been using supplemental O2 from a machine he borrowed from his friend. He said he's had SOB for 2 years but it has gotten quite worse over the past month. He said he can barely leave the house due to the shortness of breath.  Lanelle Bal

## 2020-07-08 NOTE — Telephone Encounter (Signed)
Called patient and let him know Dr. Alen Blew has been made aware of his increased shortness of breath.  Patient verbalized understanding and knows to seek emergent care if SOB becomes worse prior to his visit on Thursday.

## 2020-07-10 ENCOUNTER — Inpatient Hospital Stay (HOSPITAL_BASED_OUTPATIENT_CLINIC_OR_DEPARTMENT_OTHER): Payer: Medicare HMO | Admitting: Oncology

## 2020-07-10 ENCOUNTER — Inpatient Hospital Stay: Payer: Medicare HMO | Attending: Oncology

## 2020-07-10 ENCOUNTER — Other Ambulatory Visit: Payer: Self-pay

## 2020-07-10 ENCOUNTER — Inpatient Hospital Stay: Payer: Medicare HMO

## 2020-07-10 VITALS — BP 110/62 | HR 83 | Temp 98.0°F | Resp 18 | Ht 75.0 in | Wt 314.5 lb

## 2020-07-10 DIAGNOSIS — C61 Malignant neoplasm of prostate: Secondary | ICD-10-CM

## 2020-07-10 DIAGNOSIS — D638 Anemia in other chronic diseases classified elsewhere: Secondary | ICD-10-CM | POA: Insufficient documentation

## 2020-07-10 DIAGNOSIS — D696 Thrombocytopenia, unspecified: Secondary | ICD-10-CM | POA: Diagnosis not present

## 2020-07-10 DIAGNOSIS — Z9079 Acquired absence of other genital organ(s): Secondary | ICD-10-CM | POA: Diagnosis not present

## 2020-07-10 DIAGNOSIS — C7951 Secondary malignant neoplasm of bone: Secondary | ICD-10-CM

## 2020-07-10 DIAGNOSIS — Z95828 Presence of other vascular implants and grafts: Secondary | ICD-10-CM

## 2020-07-10 DIAGNOSIS — Z79899 Other long term (current) drug therapy: Secondary | ICD-10-CM | POA: Diagnosis not present

## 2020-07-10 DIAGNOSIS — Z9221 Personal history of antineoplastic chemotherapy: Secondary | ICD-10-CM | POA: Diagnosis not present

## 2020-07-10 LAB — CMP (CANCER CENTER ONLY)
ALT: 18 U/L (ref 0–44)
AST: 18 U/L (ref 15–41)
Albumin: 3.5 g/dL (ref 3.5–5.0)
Alkaline Phosphatase: 354 U/L — ABNORMAL HIGH (ref 38–126)
Anion gap: 6 (ref 5–15)
BUN: 13 mg/dL (ref 8–23)
CO2: 30 mmol/L (ref 22–32)
Calcium: 8.8 mg/dL — ABNORMAL LOW (ref 8.9–10.3)
Chloride: 101 mmol/L (ref 98–111)
Creatinine: 0.79 mg/dL (ref 0.61–1.24)
GFR, Est AFR Am: >60 mL/min (ref >60)
GFR, Estimated: >60 mL/min (ref >60)
Glucose, Bld: 99 mg/dL (ref 70–99)
Potassium: 4.2 mmol/L (ref 3.5–5.1)
Sodium: 137 mmol/L (ref 135–145)
Total Bilirubin: 0.5 mg/dL (ref 0.3–1.2)
Total Protein: 7 g/dL (ref 6.5–8.1)

## 2020-07-10 LAB — CBC WITH DIFFERENTIAL (CANCER CENTER ONLY)
Abs Immature Granulocytes: 0.06 10*3/uL (ref 0.00–0.07)
Basophils Absolute: 0 10*3/uL (ref 0.0–0.1)
Basophils Relative: 0 %
Eosinophils Absolute: 0.1 10*3/uL (ref 0.0–0.5)
Eosinophils Relative: 2 %
HCT: 23 % — ABNORMAL LOW (ref 39.0–52.0)
Hemoglobin: 7.1 g/dL — ABNORMAL LOW (ref 13.0–17.0)
Immature Granulocytes: 2 %
Lymphocytes Relative: 18 %
Lymphs Abs: 0.5 10*3/uL — ABNORMAL LOW (ref 0.7–4.0)
MCH: 27.1 pg (ref 26.0–34.0)
MCHC: 30.9 g/dL (ref 30.0–36.0)
MCV: 87.8 fL (ref 80.0–100.0)
Monocytes Absolute: 0.2 10*3/uL (ref 0.1–1.0)
Monocytes Relative: 6 %
Neutro Abs: 2.2 10*3/uL (ref 1.7–7.7)
Neutrophils Relative %: 72 %
Platelet Count: 89 10*3/uL — ABNORMAL LOW (ref 150–400)
RBC: 2.62 MIL/uL — ABNORMAL LOW (ref 4.22–5.81)
RDW: 19.4 % — ABNORMAL HIGH (ref 11.5–15.5)
WBC Count: 3 10*3/uL — ABNORMAL LOW (ref 4.0–10.5)
nRBC: 0 % (ref 0.0–0.2)

## 2020-07-10 MED ORDER — SODIUM CHLORIDE 0.9% FLUSH
10.0000 mL | INTRAVENOUS | Status: DC | PRN
  Administered 2020-07-10: 10 mL
  Filled 2020-07-10: qty 10

## 2020-07-10 MED ORDER — HEPARIN SOD (PORK) LOCK FLUSH 100 UNIT/ML IV SOLN
500.0000 [IU] | Freq: Once | INTRAVENOUS | Status: AC | PRN
  Administered 2020-07-10: 500 [IU]
  Filled 2020-07-10: qty 5

## 2020-07-10 MED ORDER — FENTANYL 25 MCG/HR TD PT72: 1.0000 | MEDICATED_PATCH | TRANSDERMAL | 0 refills | Status: DC

## 2020-07-10 MED ORDER — GABAPENTIN 300 MG PO CAPS
900.0000 mg | ORAL_CAPSULE | Freq: Two times a day (BID) | ORAL | 3 refills | Status: DC
Start: 2020-07-10 — End: 2020-11-03

## 2020-07-10 MED ORDER — HYDROCODONE-ACETAMINOPHEN 5-325 MG PO TABS
1.0000 | ORAL_TABLET | Freq: Four times a day (QID) | ORAL | 0 refills | Status: DC | PRN
Start: 2020-07-10 — End: 2020-08-13

## 2020-07-10 NOTE — Progress Notes (Signed)
Hematology and Oncology Follow Up Visit  Gregory Schneider [id number] 20-Feb-1956 64 y.o. 07/10/2020 2:47 PM   Principle Diagnosis: 64 year old man with advanced prostate cancer with disease to the bone diagnosed in 2013.  He was diagnosed 2010 with Gleason score of 4+5 = 9 .  Guardant 360 analysis showed his tumor harbors BRAF and EGFR   Prior Therapy:  Combined androgen deprivation with Lupron and Casodex. He had a good response and then developed a rise in the PSA with castrate level testosterone.  He is S/P Bilateral simple orchiectomy done on 12/25/2012.  Provenge started on 03/03/12. Therapy Ended in 03/31/2012. Case Zytiga started in 10/2013. Therapy discontinued in January 2018 because of progression of disease. Xofigo infusion to start on Apr 13, 2018.  He completed 6 months of therapy in November 2019. Status post prostate radiation between December 19, 2018 and January 01, 2019.  He received total of 30 Gy in 10 fractions to the prostate and the pubic rami. Xtandi 160 mg daily started in February 2018.  Therapy discontinued in October 2020 because of progression of disease.  Taxotere chemotherapy started on September 13, 2019. He completed 9 cycles of therapy in May 2021.   Current therapy: Dabrafenib 150 mg twice a day with trametinib 2 mg daily which has not been started at this time.      Interim History:  Gregory Schneider is here for repeat evaluation.  Since the last visit, he reports continuous health decline in the last few months.  He has reported increased fatigue tiredness and worsening dyspnea on exertion.  He has reported more weight loss and inability to have any exercise tolerance.  He does report occasional cough but no hemoptysis or wheezing.  He denies any nausea vomiting or abdominal pain.  He has reported worsening bone pain and occasional agitation associated with his increased pain.  He has been using hydrocodone which has been less effective.     Medications: Unchanged  on review. Current Outpatient Medications  Medication Sig Dispense Refill  . amLODipine (NORVASC) 2.5 MG tablet Take 2.5 mg by mouth daily.    Marland Kitchen buPROPion (ZYBAN) 150 MG 12 hr tablet Take 150 mg by mouth.    . calcium carbonate (OS-CAL) 600 MG TABS Take 600 mg by mouth daily.    . cholecalciferol (VITAMIN D) 1000 UNITS tablet Take 1,000 Units by mouth daily.    . clonazePAM (KLONOPIN) 1 MG tablet Take 1 mg by mouth as needed for anxiety (1-2 tablets daily as needed).     . Cyanocobalamin (RA VITAMIN B-12 TR) 1000 MCG TBCR Take by mouth.    . dabrafenib mesylate (TAFINLAR) 75 MG capsule Take 2 capsules (150 mg total) by mouth 2 (two) times daily. Take on an empty stomach 1 hour before or 2 hours after meals. 120 capsule 0  . gabapentin (NEURONTIN) 300 MG capsule Take 3 capsules (900 mg total) by mouth 2 (two) times daily. 180 capsule 3  . HYDROcodone-acetaminophen (NORCO/VICODIN) 5-325 MG tablet Take 1-2 tablets by mouth every 6 (six) hours as needed. 90 tablet 0  . lidocaine-prilocaine (EMLA) cream Apply 1 application topically as needed. 30 g 0  . losartan-hydrochlorothiazide (HYZAAR) 100-25 MG tablet   0  . Multiple Vitamin (MULTIVITAMIN) tablet Take 1 tablet by mouth daily.    . prochlorperazine (COMPAZINE) 10 MG tablet Take 1 tablet (10 mg total) by mouth every 6 (six) hours as needed for nausea or vomiting. 30 tablet 3  . sertraline (ZOLOFT) 100 MG  tablet Take 200 mg by mouth every morning.     . trametinib dimethyl sulfoxide (MEKINIST) 2 MG tablet Take 1 tablet (2 mg total) by mouth daily. Take 1 hour before or 2 hours after a meal. Store refrigerated in original container. 30 tablet 0  . traZODone (DESYREL) 50 MG tablet TAKE 1 TABLET BY MOUTH EVERY DAY AT NIGHT     No current facility-administered medications for this visit.    Allergies:  Allergies  Allergen Reactions  . Penicillins Other (See Comments)    Patient stated,"I don't remember what kind of reaction because I was a kid."       Physical Exam:    Blood pressure 110/62, pulse 83, temperature 98 F (36.7 C), temperature source Tympanic, resp. rate 18, height $RemoveBe'6\' 3"'GZsneALmn$  (1.905 m), weight (!) 314 lb 8 oz (142.7 kg), SpO2 96 %.        ECOG: 2   General appearance: Alert, awake without any distress. Head: Atraumatic without abnormalities Oropharynx: Without any thrush or ulcers. Eyes: No scleral icterus. Lymph nodes: No lymphadenopathy noted in the cervical, supraclavicular, or axillary nodes Heart:regular rate and rhythm, without any murmurs or gallops.   Lung: Clear to auscultation without any rhonchi, wheezes or dullness to percussion. Abdomin: Soft, nontender without any shifting dullness or ascites. Musculoskeletal: No clubbing or cyanosis. Neurological: No motor or sensory deficits. Skin: No rashes or lesions.      .                 Lab Results: Lab Results  Component Value Date   WBC 3.8 (L) 06/05/2020   HGB 8.0 (L) 06/05/2020   HCT 25.9 (L) 06/05/2020   MCV 89.0 06/05/2020   PLT 121 (L) 06/05/2020     Chemistry      Component Value Date/Time   NA 137 06/05/2020 1455   NA 136 11/17/2017 0956   K 3.8 06/05/2020 1455   K 3.8 11/17/2017 0956   CL 99 06/05/2020 1455   CL 104 03/09/2013 0921   CO2 27 06/05/2020 1455   CO2 28 11/17/2017 0956   BUN 15 06/05/2020 1455   BUN 19.7 11/17/2017 0956   CREATININE 0.87 06/05/2020 1455   CREATININE 0.9 11/17/2017 0956      Component Value Date/Time   CALCIUM 8.5 (L) 06/05/2020 1455   CALCIUM 9.5 11/17/2017 0956   ALKPHOS 260 (H) 06/05/2020 1455   ALKPHOS 89 11/17/2017 0956   AST 21 06/05/2020 1455   AST 21 11/17/2017 0956   ALT 33 06/05/2020 1455   ALT 47 11/17/2017 0956   BILITOT 0.8 06/05/2020 1455   BILITOT 0.39 11/17/2017 0956         Results for Gregory Schneider, Gregory Schneider (MRN [id number]) as of 07/10/2020 14:53  Ref. Range 04/16/2020 11:55 04/29/2020 11:02 06/05/2020 14:55  Prostate Specific Ag, Serum Latest Ref Range: 0.0  - 4.0 ng/mL 233.0 (H) 303.0 (H) 445.0 (H)        Impression and Plan:    64 year old man with  1.  Advanced prostate cancer with disease to the bone since 2013.  He has localized disease diagnosed in 2010.    The natural course of his disease was reiterated again and appears to have continued to decline at this time.He was supposed to start dabrafenib 150 mg twice a day with trametinib 2 mg daily due to BRAF mutation but has not received it at this time.  Treatment options were reiterated again today.  Proceeding with palliative care and hospice  approach given his overall decline is a legitimate option at this time.  Trying a BRAF targeted therapy would likely have low yield and will consider that option at this time.  I feel that his performance status is declining rapidly and likely will require hospice in the near future.  He understand this and will consider his options moving forward.  2. Androgen deprivation: No additional androgen therapy is needed.  He is status post orchiectomy.   3.  Anemia: Multifactorial in nature related to his malignancy and chronic disease.  His hemoglobin is down and he is symptomatic.  We will arrange for 2 units of packed red cells.    4.  Prognosis and goals of care: His disease is incurable and any therapy remains palliative at this time.  If his disease continues to progress will likely require hospice and discontinuation of all additional therapy.  This was discussed with him and his wife today and he will consider these options in the future.  5.  Thrombocytopenia: Improved after discontinuation of chemotherapy.  His platelet count continues to decline at this time likely indicating bone marrow disease.   6.  Bone pain: His pain is poorly controlled and will start on long-acting medication.  We will start him on a fentanyl patch and he will use hydrocodone as needed.  Complication associated with this medication includes somnolence and  constipation were reiterated.  7.  IV access: Remains in place and will be flushed periodically.  8.  Dyspnea: Multifactorial related to malignancy and anemia.  Will assess for home oxygen need.  He has been using oxygen from a friend which have helped his symptoms.  9. Follow up: In 6 weeks to follow his progress.  30 minutes were dedicated to this encounter.  The time was spent on reviewing his disease status, reviewing laboratory data and future plan of care review.  Zola Button MD 8/26/20212:47 PM

## 2020-07-11 LAB — PROSTATE-SPECIFIC AG, SERUM (LABCORP): Prostate Specific Ag, Serum: 497 ng/mL — ABNORMAL HIGH (ref 0.0–4.0)

## 2020-07-18 ENCOUNTER — Telehealth: Payer: Self-pay | Admitting: Oncology

## 2020-07-18 NOTE — Telephone Encounter (Signed)
Rescheduled cancelled 09/04 appointment, called patient regarding 09/10 appointment.

## 2020-07-19 ENCOUNTER — Ambulatory Visit: Payer: Medicare HMO

## 2020-07-25 ENCOUNTER — Other Ambulatory Visit: Payer: Self-pay

## 2020-07-25 ENCOUNTER — Inpatient Hospital Stay: Payer: Medicare HMO

## 2020-07-25 ENCOUNTER — Telehealth: Payer: Self-pay

## 2020-07-25 ENCOUNTER — Inpatient Hospital Stay: Payer: Medicare HMO | Attending: Oncology

## 2020-07-25 VITALS — BP 124/67 | HR 76 | Temp 98.0°F | Resp 18 | Ht 75.0 in | Wt 311.9 lb

## 2020-07-25 DIAGNOSIS — D630 Anemia in neoplastic disease: Secondary | ICD-10-CM | POA: Diagnosis not present

## 2020-07-25 DIAGNOSIS — Z9221 Personal history of antineoplastic chemotherapy: Secondary | ICD-10-CM | POA: Diagnosis not present

## 2020-07-25 DIAGNOSIS — C7951 Secondary malignant neoplasm of bone: Secondary | ICD-10-CM | POA: Insufficient documentation

## 2020-07-25 DIAGNOSIS — C61 Malignant neoplasm of prostate: Secondary | ICD-10-CM

## 2020-07-25 DIAGNOSIS — Z923 Personal history of irradiation: Secondary | ICD-10-CM | POA: Insufficient documentation

## 2020-07-25 DIAGNOSIS — Z9079 Acquired absence of other genital organ(s): Secondary | ICD-10-CM | POA: Insufficient documentation

## 2020-07-25 DIAGNOSIS — Z95828 Presence of other vascular implants and grafts: Secondary | ICD-10-CM

## 2020-07-25 LAB — CMP (CANCER CENTER ONLY)
ALT: 23 U/L (ref 0–44)
AST: 26 U/L (ref 15–41)
Albumin: 3.4 g/dL — ABNORMAL LOW (ref 3.5–5.0)
Alkaline Phosphatase: 312 U/L — ABNORMAL HIGH (ref 38–126)
Anion gap: 9 (ref 5–15)
BUN: 15 mg/dL (ref 8–23)
CO2: 27 mmol/L (ref 22–32)
Calcium: 8.7 mg/dL — ABNORMAL LOW (ref 8.9–10.3)
Chloride: 101 mmol/L (ref 98–111)
Creatinine: 0.84 mg/dL (ref 0.61–1.24)
GFR, Est AFR Am: >60 mL/min (ref >60)
GFR, Estimated: >60 mL/min (ref >60)
Glucose, Bld: 134 mg/dL — ABNORMAL HIGH (ref 70–99)
Potassium: 3.9 mmol/L (ref 3.5–5.1)
Sodium: 137 mmol/L (ref 135–145)
Total Bilirubin: 0.4 mg/dL (ref 0.3–1.2)
Total Protein: 7.2 g/dL (ref 6.5–8.1)

## 2020-07-25 LAB — SAMPLE TO BLOOD BANK

## 2020-07-25 LAB — CBC WITH DIFFERENTIAL (CANCER CENTER ONLY)
Abs Immature Granulocytes: 0.03 10*3/uL (ref 0.00–0.07)
Basophils Absolute: 0 10*3/uL (ref 0.0–0.1)
Basophils Relative: 0 %
Eosinophils Absolute: 0.1 10*3/uL (ref 0.0–0.5)
Eosinophils Relative: 3 %
HCT: 19 % — ABNORMAL LOW (ref 39.0–52.0)
Hemoglobin: 5.8 g/dL — CL (ref 13.0–17.0)
Immature Granulocytes: 1 %
Lymphocytes Relative: 13 %
Lymphs Abs: 0.3 10*3/uL — ABNORMAL LOW (ref 0.7–4.0)
MCH: 27 pg (ref 26.0–34.0)
MCHC: 30.5 g/dL (ref 30.0–36.0)
MCV: 88.4 fL (ref 80.0–100.0)
Monocytes Absolute: 0.2 10*3/uL (ref 0.1–1.0)
Monocytes Relative: 7 %
Neutro Abs: 1.9 10*3/uL (ref 1.7–7.7)
Neutrophils Relative %: 76 %
Platelet Count: 76 10*3/uL — ABNORMAL LOW (ref 150–400)
RBC: 2.15 MIL/uL — ABNORMAL LOW (ref 4.22–5.81)
RDW: 20.3 % — ABNORMAL HIGH (ref 11.5–15.5)
WBC Count: 2.5 10*3/uL — ABNORMAL LOW (ref 4.0–10.5)
nRBC: 0 % (ref 0.0–0.2)

## 2020-07-25 LAB — PREPARE RBC (CROSSMATCH)

## 2020-07-25 MED ORDER — SODIUM CHLORIDE 0.9% IV SOLUTION
250.0000 mL | Freq: Once | INTRAVENOUS | Status: AC
  Administered 2020-07-25: 250 mL via INTRAVENOUS
  Filled 2020-07-25: qty 250

## 2020-07-25 MED ORDER — SODIUM CHLORIDE 0.9% FLUSH
10.0000 mL | INTRAVENOUS | Status: DC | PRN
  Administered 2020-07-25: 10 mL
  Filled 2020-07-25: qty 10

## 2020-07-25 MED ORDER — ACETAMINOPHEN 325 MG PO TABS
ORAL_TABLET | ORAL | Status: AC
  Filled 2020-07-25: qty 2

## 2020-07-25 MED ORDER — ACETAMINOPHEN 325 MG PO TABS
650.0000 mg | ORAL_TABLET | Freq: Once | ORAL | Status: AC
  Administered 2020-07-25: 650 mg via ORAL

## 2020-07-25 MED ORDER — HEPARIN SOD (PORK) LOCK FLUSH 100 UNIT/ML IV SOLN
500.0000 [IU] | Freq: Once | INTRAVENOUS | Status: AC | PRN
  Administered 2020-07-25: 500 [IU]
  Filled 2020-07-25: qty 5

## 2020-07-25 MED ORDER — SODIUM CHLORIDE 0.9% FLUSH
10.0000 mL | INTRAVENOUS | Status: AC | PRN
  Administered 2020-07-25: 10 mL
  Filled 2020-07-25: qty 10

## 2020-07-25 MED ORDER — DIPHENHYDRAMINE HCL 25 MG PO CAPS
ORAL_CAPSULE | ORAL | Status: AC
  Filled 2020-07-25: qty 1

## 2020-07-25 MED ORDER — DIPHENHYDRAMINE HCL 25 MG PO CAPS
25.0000 mg | ORAL_CAPSULE | Freq: Once | ORAL | Status: AC
  Administered 2020-07-25: 25 mg via ORAL

## 2020-07-25 NOTE — Patient Instructions (Signed)

## 2020-07-25 NOTE — Telephone Encounter (Signed)
CRITICAL VALUE STICKER  CRITICAL VALUE: Hemoglobin 5.8  RECEIVER (on-site recipient of call): Wendall Mola, RN  [place] NOTIFIED: 07/25/2020 @ 0928  MESSENGER (representative from lab): Martina Sinner  MD NOTIFIED: Dr. Zola Button  TIME OF NOTIFICATION: 0930  RESPONSE: Acknowledged. Patient to receive two units of blood today. Orders placed.

## 2020-07-26 LAB — PROSTATE-SPECIFIC AG, SERUM (LABCORP): Prostate Specific Ag, Serum: 607 ng/mL — ABNORMAL HIGH (ref 0.0–4.0)

## 2020-07-27 LAB — TYPE AND SCREEN
ABO/RH(D): B NEG
Antibody Screen: NEGATIVE
Unit division: 0
Unit division: 0

## 2020-07-27 LAB — BPAM RBC
Blood Product Expiration Date: 202110022359
Blood Product Expiration Date: 202110022359
ISSUE DATE / TIME: 202109101047
ISSUE DATE / TIME: 202109101047
Unit Type and Rh: 1700
Unit Type and Rh: 1700

## 2020-07-28 ENCOUNTER — Telehealth: Payer: Self-pay

## 2020-07-28 NOTE — Telephone Encounter (Signed)
Betsy from [employer] called stating they received an order for oxygen but there is no diagnosis or supporting notes.  Please call her at [phone]. Forwarded to Dr. Hazeline Junker nurse.

## 2020-07-29 ENCOUNTER — Other Ambulatory Visit: Payer: Self-pay | Admitting: Oncology

## 2020-07-29 DIAGNOSIS — C61 Malignant neoplasm of prostate: Secondary | ICD-10-CM

## 2020-07-30 ENCOUNTER — Telehealth: Payer: Self-pay

## 2020-07-30 NOTE — Telephone Encounter (Signed)
Returned call to patient per Dr Alen Blew.

## 2020-07-30 NOTE — Telephone Encounter (Signed)
-----   Message from Wyatt Portela, MD sent at 07/30/2020  3:35 PM EDT ----- He can change the patch every two days instead of three. CT scan ordered. He will be contacted with date and time I will address experimental drugs with him next visit.  They will not help his cancer.   Thanks  FS ----- Message ----- From: Kennedy Bucker, LPN Sent: 8/78/6767   3:25 PM EDT To: Wyatt Portela, MD  Sorry ----- Message ----- From: Wyatt Portela, MD Sent: 07/30/2020   3:02 PM EDT To: Kennedy Bucker, LPN  Patient? ----- Message ----- From: Kennedy Bucker, LPN Sent: 12/25/4707   1:51 PM EDT To: Wyatt Portela, MD  Patient left a message about possible new medication that was discussed at his last appointment. He states that there were some new experimental medications that was discussed and wanted to follow up about those medication. Patient also states that the Fentanyl patch only helps with his pain for the first day and half then it does not help at all. Patient also asked about a CAT scan that was supposed to be order. Please advise. Thank you.   Maudie Mercury

## 2020-07-31 ENCOUNTER — Other Ambulatory Visit: Payer: Self-pay | Admitting: Oncology

## 2020-07-31 ENCOUNTER — Telehealth: Payer: Self-pay

## 2020-07-31 MED ORDER — FENTANYL 25 MCG/HR TD PT72: 1.0000 | MEDICATED_PATCH | TRANSDERMAL | 0 refills | Status: DC

## 2020-07-31 NOTE — Telephone Encounter (Signed)
-----   Message from Wyatt Portela, MD sent at 07/31/2020  7:30 AM EDT ----- I will refill his patches. Thanks ----- Message ----- From: Kennedy Bucker, LPN Sent: 8/46/9629   4:28 PM EDT To: Wyatt Portela, MD  Called patient and gave new instructions for his patch. Patient states that he will need a refill soon because he messed up a few. He states since his blood infusion he is feeling better and he is not out of breathe like he was before the transfusion.   Kim ----- Message ----- From: Wyatt Portela, MD Sent: 07/30/2020   3:35 PM EDT To: Kennedy Bucker, LPN  He can change the patch every two days instead of three. CT scan ordered. He will be contacted with date and time I will address experimental drugs with him next visit.  They will not help his cancer.   Thanks  FS ----- Message ----- From: Kennedy Bucker, LPN Sent: 04/12/4131   3:25 PM EDT To: Wyatt Portela, MD  Sorry ----- Message ----- From: Wyatt Portela, MD Sent: 07/30/2020   3:02 PM EDT To: Kennedy Bucker, LPN  Patient? ----- Message ----- From: Kennedy Bucker, LPN Sent: 4/40/1027   1:51 PM EDT To: Wyatt Portela, MD  Patient left a message about possible new medication that was discussed at his last appointment. He states that there were some new experimental medications that was discussed and wanted to follow up about those medication. Patient also states that the Fentanyl patch only helps with his pain for the first day and half then it does not help at all. Patient also asked about a CAT scan that was supposed to be order. Please advise. Thank you.   Maudie Mercury

## 2020-08-01 ENCOUNTER — Telehealth: Payer: Self-pay

## 2020-08-01 NOTE — Telephone Encounter (Signed)
Faxed [employer] paperwork.

## 2020-08-04 ENCOUNTER — Telehealth: Payer: Self-pay | Admitting: *Deleted

## 2020-08-04 NOTE — Telephone Encounter (Signed)
Received call from patient checking on his Fentanyl patch refill. Advised that Dr. Alen Blew sent in the refill on 07/31/20. Pt states he will check with his pharmacy.  No further questions or concerns

## 2020-08-05 ENCOUNTER — Telehealth: Payer: Self-pay

## 2020-08-05 NOTE — Telephone Encounter (Signed)
Returned patient phone call. Advise patient that PR has been gotten and patient can call Central Sched[facility] to set up CT scan. Advised patient new script had been sent in for his fentyl patch and PR has to be gotten from his insurance before it can be filled. Patient verbalized understanding.

## 2020-08-11 ENCOUNTER — Other Ambulatory Visit: Payer: Self-pay

## 2020-08-11 ENCOUNTER — Ambulatory Visit (HOSPITAL_BASED_OUTPATIENT_CLINIC_OR_DEPARTMENT_OTHER)
Admission: RE | Admit: 2020-08-11 | Discharge: 2020-08-11 | Disposition: A | Discharge status: Home / Self Care | Payer: Medicare HMO | Source: Ambulatory Visit | Attending: Oncology | Admitting: Oncology

## 2020-08-11 DIAGNOSIS — I2510 Atherosclerotic heart disease of native coronary artery without angina pectoris: Secondary | ICD-10-CM | POA: Diagnosis not present

## 2020-08-11 DIAGNOSIS — C61 Malignant neoplasm of prostate: Secondary | ICD-10-CM | POA: Diagnosis not present

## 2020-08-11 DIAGNOSIS — I700 Atherosclerosis of aorta: Secondary | ICD-10-CM | POA: Diagnosis not present

## 2020-08-11 DIAGNOSIS — R911 Solitary pulmonary nodule: Secondary | ICD-10-CM | POA: Diagnosis not present

## 2020-08-11 DIAGNOSIS — C7951 Secondary malignant neoplasm of bone: Secondary | ICD-10-CM | POA: Diagnosis not present

## 2020-08-12 ENCOUNTER — Inpatient Hospital Stay: Payer: Medicare HMO

## 2020-08-12 ENCOUNTER — Telehealth: Payer: Self-pay

## 2020-08-12 ENCOUNTER — Other Ambulatory Visit: Payer: Self-pay

## 2020-08-12 ENCOUNTER — Inpatient Hospital Stay (HOSPITAL_BASED_OUTPATIENT_CLINIC_OR_DEPARTMENT_OTHER): Payer: Medicare HMO | Admitting: Oncology

## 2020-08-12 VITALS — BP 114/61 | HR 82 | Temp 98.5°F | Resp 18 | Ht 75.0 in | Wt 310.0 lb

## 2020-08-12 DIAGNOSIS — C61 Malignant neoplasm of prostate: Secondary | ICD-10-CM

## 2020-08-12 DIAGNOSIS — Z95828 Presence of other vascular implants and grafts: Secondary | ICD-10-CM

## 2020-08-12 DIAGNOSIS — C7951 Secondary malignant neoplasm of bone: Secondary | ICD-10-CM

## 2020-08-12 LAB — CMP (CANCER CENTER ONLY)
ALT: 20 U/L (ref 0–44)
AST: 18 U/L (ref 15–41)
Albumin: 3.4 g/dL — ABNORMAL LOW (ref 3.5–5.0)
Alkaline Phosphatase: 326 U/L — ABNORMAL HIGH (ref 38–126)
Anion gap: 7 (ref 5–15)
BUN: 16 mg/dL (ref 8–23)
CO2: 27 mmol/L (ref 22–32)
Calcium: 8.9 mg/dL (ref 8.9–10.3)
Chloride: 100 mmol/L (ref 98–111)
Creatinine: 0.82 mg/dL (ref 0.61–1.24)
GFR, Est AFR Am: >60 mL/min (ref >60)
GFR, Estimated: >60 mL/min (ref >60)
Glucose, Bld: 131 mg/dL — ABNORMAL HIGH (ref 70–99)
Potassium: 4.2 mmol/L (ref 3.5–5.1)
Sodium: 134 mmol/L — ABNORMAL LOW (ref 135–145)
Total Bilirubin: 0.4 mg/dL (ref 0.3–1.2)
Total Protein: 7.3 g/dL (ref 6.5–8.1)

## 2020-08-12 LAB — CBC WITH DIFFERENTIAL (CANCER CENTER ONLY)
Abs Immature Granulocytes: 0.06 10*3/uL (ref 0.00–0.07)
Basophils Absolute: 0 10*3/uL (ref 0.0–0.1)
Basophils Relative: 0 %
Eosinophils Absolute: 0.1 10*3/uL (ref 0.0–0.5)
Eosinophils Relative: 4 %
HCT: 20.8 % — ABNORMAL LOW (ref 39.0–52.0)
Hemoglobin: 6.6 g/dL — CL (ref 13.0–17.0)
Immature Granulocytes: 2 %
Lymphocytes Relative: 19 %
Lymphs Abs: 0.5 10*3/uL — ABNORMAL LOW (ref 0.7–4.0)
MCH: 27 pg (ref 26.0–34.0)
MCHC: 31.7 g/dL (ref 30.0–36.0)
MCV: 85.2 fL (ref 80.0–100.0)
Monocytes Absolute: 0.1 10*3/uL (ref 0.1–1.0)
Monocytes Relative: 4 %
Neutro Abs: 1.8 10*3/uL (ref 1.7–7.7)
Neutrophils Relative %: 71 %
Platelet Count: 66 10*3/uL — ABNORMAL LOW (ref 150–400)
RBC: 2.44 MIL/uL — ABNORMAL LOW (ref 4.22–5.81)
RDW: 20.4 % — ABNORMAL HIGH (ref 11.5–15.5)
WBC Count: 2.5 10*3/uL — ABNORMAL LOW (ref 4.0–10.5)
nRBC: 0 % (ref 0.0–0.2)

## 2020-08-12 LAB — SAMPLE TO BLOOD BANK

## 2020-08-12 LAB — PREPARE RBC (CROSSMATCH)

## 2020-08-12 MED ORDER — SODIUM CHLORIDE 0.9% FLUSH
10.0000 mL | INTRAVENOUS | Status: DC | PRN
  Administered 2020-08-12: 10 mL
  Filled 2020-08-12: qty 10

## 2020-08-12 MED ORDER — HEPARIN SOD (PORK) LOCK FLUSH 100 UNIT/ML IV SOLN
500.0000 [IU] | Freq: Once | INTRAVENOUS | Status: AC | PRN
  Administered 2020-08-12: 500 [IU]
  Filled 2020-08-12: qty 5

## 2020-08-12 NOTE — Telephone Encounter (Signed)
Spoke to [place] in blood bank to confirm orders have been received for Thursday's blood transfusion. Lattie Haw confirmed that she can see the orders.

## 2020-08-12 NOTE — Progress Notes (Signed)
Orders placed for 2 units PRBC. Patient is scheduled to receive blood on Thursday 08/14/20. Patient is aware of appointment day and time and not to remove blue blood bank bracelet.

## 2020-08-12 NOTE — Progress Notes (Signed)
Hematology and Oncology Follow Up Visit  Gregory Schneider [id number] Feb 15, 1956 64 y.o. 08/12/2020 3:45 PM   Principle Diagnosis: 64 year old man with castration-resistant prostate cancer with disease to the bone diagnosed in 2013.  Guardant 360 analysis showed his tumor harbors BRAF and EGFR.  He was initially diagnosed in 2010 and a Gleason score of 9 advanced disease.  Prior Therapy:  Combined androgen deprivation with Lupron and Casodex. He had a good response and then developed a rise in the PSA with castrate level testosterone.  He is S/P Bilateral simple orchiectomy done on 12/25/2012.  Provenge started on 03/03/12. Therapy Ended in 03/31/2012. Case Zytiga started in 10/2013. Therapy discontinued in January 2018 because of progression of disease. Xofigo infusion to start on Apr 13, 2018.  He completed 6 months of therapy in November 2019. Status post prostate radiation between December 19, 2018 and January 01, 2019.  He received total of 30 Gy in 10 fractions to the prostate and the pubic rami. Xtandi 160 mg daily started in February 2018.  Therapy discontinued in October 2020 because of progression of disease.  Taxotere chemotherapy started on September 13, 2019. He completed 9 cycles of therapy in May 2021.   Current therapy: Supportive care only.      Interim History:  Gregory Schneider returns today for repeat follow-up visit.  Since last visit, he received packed red cell transfusion with improvement in his overall formal status and activity level.  He reports his dyspnea has improved although his fatigue has worsened after slight improvement.  He denies any chest pain, difficulty ambulating or hemoptysis.  He denies any worsening bone pain or pathological fractures.     Medications: Reviewed without changes. Current Outpatient Medications  Medication Sig Dispense Refill  . amLODipine (NORVASC) 2.5 MG tablet Take 2.5 mg by mouth daily.    Marland Kitchen buPROPion (ZYBAN) 150 MG 12 hr tablet Take  150 mg by mouth.    . calcium carbonate (OS-CAL) 600 MG TABS Take 600 mg by mouth daily.    . cholecalciferol (VITAMIN D) 1000 UNITS tablet Take 1,000 Units by mouth daily.    . clonazePAM (KLONOPIN) 1 MG tablet Take 1 mg by mouth as needed for anxiety (1-2 tablets daily as needed).     . Cyanocobalamin (RA VITAMIN B-12 TR) 1000 MCG TBCR Take by mouth.    . dabrafenib mesylate (TAFINLAR) 75 MG capsule Take 2 capsules (150 mg total) by mouth 2 (two) times daily. Take on an empty stomach 1 hour before or 2 hours after meals. 120 capsule 0  . fentaNYL (DURAGESIC) 25 MCG/HR Place 1 patch onto the skin every other day. 15 patch 0  . gabapentin (NEURONTIN) 300 MG capsule Take 3 capsules (900 mg total) by mouth 2 (two) times daily. 180 capsule 3  . HYDROcodone-acetaminophen (NORCO/VICODIN) 5-325 MG tablet Take 1-2 tablets by mouth every 6 (six) hours as needed. 90 tablet 0  . lidocaine-prilocaine (EMLA) cream Apply 1 application topically as needed. 30 g 0  . losartan-hydrochlorothiazide (HYZAAR) 100-25 MG tablet   0  . Multiple Vitamin (MULTIVITAMIN) tablet Take 1 tablet by mouth daily.    . prochlorperazine (COMPAZINE) 10 MG tablet Take 1 tablet (10 mg total) by mouth every 6 (six) hours as needed for nausea or vomiting. 30 tablet 3  . sertraline (ZOLOFT) 100 MG tablet Take 200 mg by mouth every morning.     . trametinib dimethyl sulfoxide (MEKINIST) 2 MG tablet Take 1 tablet (2 mg total) by mouth  daily. Take 1 hour before or 2 hours after a meal. Store refrigerated in original container. 30 tablet 0  . traZODone (DESYREL) 50 MG tablet TAKE 1 TABLET BY MOUTH EVERY DAY AT NIGHT     No current facility-administered medications for this visit.   Facility-Administered Medications Ordered in Other Visits  Medication Dose Route Frequency Provider Last Rate Last Admin  . sodium chloride flush (NS) 0.9 % injection 10 mL  10 mL Intracatheter PRN Wyatt Portela, MD   10 mL at 07/25/20 1554    Allergies:   Allergies  Allergen Reactions  . Penicillins Other (See Comments)    Patient stated,"I don't remember what kind of reaction because I was a kid."      Physical Exam:    Blood pressure 114/61, pulse 82, temperature 98.5 F (36.9 C), temperature source Tympanic, resp. rate 18, height $RemoveBe'6\' 3"'cxDfuqycT$  (1.905 m), weight (!) 310 lb (140.6 kg), SpO2 99 %.        ECOG: 2    General appearance: Comfortable appearing without any discomfort Head: Normocephalic without any trauma Oropharynx: Mucous membranes are moist and pink without any thrush or ulcers. Eyes: Pupils are equal and round reactive to light. Lymph nodes: No cervical, supraclavicular, inguinal or axillary lymphadenopathy.   Heart:regular rate and rhythm.  S1 and S2 without leg edema. Lung: Clear without any rhonchi or wheezes.  No dullness to percussion. Abdomin: Soft, nontender, nondistended with good bowel sounds.  No hepatosplenomegaly. Musculoskeletal: No joint deformity or effusion.  Full range of motion noted. Neurological: No deficits noted on motor, sensory and deep tendon reflex exam. Skin: No petechial rash or dryness.  Appeared moist.        .                 Lab Results: Lab Results  Component Value Date   WBC 2.5 (L) 08/12/2020   HGB 6.6 (LL) 08/12/2020   HCT 20.8 (L) 08/12/2020   MCV 85.2 08/12/2020   PLT 66 (L) 08/12/2020     Chemistry      Component Value Date/Time   NA 137 07/25/2020 0905   NA 136 11/17/2017 0956   K 3.9 07/25/2020 0905   K 3.8 11/17/2017 0956   CL 101 07/25/2020 0905   CL 104 03/09/2013 0921   CO2 27 07/25/2020 0905   CO2 28 11/17/2017 0956   BUN 15 07/25/2020 0905   BUN 19.7 11/17/2017 0956   CREATININE 0.84 07/25/2020 0905   CREATININE 0.9 11/17/2017 0956      Component Value Date/Time   CALCIUM 8.7 (L) 07/25/2020 0905   CALCIUM 9.5 11/17/2017 0956   ALKPHOS 312 (H) 07/25/2020 0905   ALKPHOS 89 11/17/2017 0956   AST 26 07/25/2020 0905   AST 21  11/17/2017 0956   ALT 23 07/25/2020 0905   ALT 47 11/17/2017 0956   BILITOT 0.4 07/25/2020 0905   BILITOT 0.39 11/17/2017 0956       Results for KEEFE, ZAWISTOWSKI (MRN [id number]) as of 08/12/2020 15:31  Ref. Range 06/05/2020 14:55 07/10/2020 14:58 07/25/2020 09:05  Prostate Specific Ag, Serum Latest Ref Range: 0.0 - 4.0 ng/mL 445.0 (H) 497.0 (H) 607.0 (H)           Impression and Plan:    64 year old man with  1.  Castration-resistant prostate cancer with disease to the bone diagnosed in 2013.    His disease status was updated and discussed today with the patient and his wife in detail.  Treatment  options were also reviewed.  He is a poor candidate for additional chemotherapy which is the only remaining treatment option at this time.  BRAF or EGFR targeted therapy would be less likely to help could be more problematic given his overall poor performance status.  I recommended supportive care only for the time being and reevaluate in the future for potential additional therapy.  If his clinical status continues to improve, potential BRAF or EGFR targeted therapy could be tried.  If his functional status declines, I would consider hospice referral at the time.  2. Androgen deprivation: He is status post orchiectomy and no additional androgen deprivation is needed.   3.  Anemia: Related to malignancy and likely bone marrow involvement with prostate cancer.  We will set up for packed red cell transfusion for him.    4.  Prognosis and goals of care: Any treatment is palliative at this time and his prognosis is overall poor given his advanced malignancy and poor performance status.   5  Bone pain: Pain is controlled for the time being with fentanyl and hydrocodone.   6.  Dyspnea: Improved after packed red cell transfusion.  CT scan of the chest did not show any acute pathology.  7. Follow up: He will follow-up in 2 weeks for reevaluation for possible repeat transfusion in 4 to 6  weeks for MD follow-up.  30 minutes were spent on this visit.  The time was dedicated to reviewing his disease status, discussing treatment options and future plan of care review.  Zola Button MD 9/28/20213:45 PM

## 2020-08-12 NOTE — Telephone Encounter (Signed)
CRITICAL VALUE STICKER  CRITICAL VALUE: Hemoglobin 6.6  RECEIVER (on-site recipient of call): Paula Compton  DATE & TIME NOTIFIED: 08/12/20 @ 1530  MESSENGER (representative from lab): H. Flynt  MD NOTIFIED: Dr. Zola Button  TIME OF NOTIFICATION: 1470  RESPONSE: Patient to receive 2 units of blood in the next two days.

## 2020-08-12 NOTE — Patient Instructions (Signed)

## 2020-08-13 ENCOUNTER — Other Ambulatory Visit: Payer: Self-pay | Admitting: Oncology

## 2020-08-13 DIAGNOSIS — C7951 Secondary malignant neoplasm of bone: Secondary | ICD-10-CM

## 2020-08-13 DIAGNOSIS — C61 Malignant neoplasm of prostate: Secondary | ICD-10-CM

## 2020-08-13 LAB — PROSTATE-SPECIFIC AG, SERUM (LABCORP): Prostate Specific Ag, Serum: 617 ng/mL — ABNORMAL HIGH (ref 0.0–4.0)

## 2020-08-13 MED ORDER — HYDROCODONE-ACETAMINOPHEN 5-325 MG PO TABS: 1.0000 | ORAL_TABLET | Freq: Four times a day (QID) | ORAL | 0 refills | Status: DC | PRN

## 2020-08-14 ENCOUNTER — Inpatient Hospital Stay: Payer: Medicare HMO

## 2020-08-14 ENCOUNTER — Other Ambulatory Visit: Payer: Self-pay

## 2020-08-14 VITALS — BP 125/72 | HR 68 | Temp 98.4°F | Resp 18 | Ht 75.0 in | Wt 310.4 lb

## 2020-08-14 DIAGNOSIS — C61 Malignant neoplasm of prostate: Secondary | ICD-10-CM | POA: Diagnosis not present

## 2020-08-14 DIAGNOSIS — Z95828 Presence of other vascular implants and grafts: Secondary | ICD-10-CM

## 2020-08-14 MED ORDER — SODIUM CHLORIDE 0.9% IV SOLUTION
250.0000 mL | Freq: Once | INTRAVENOUS | Status: AC
  Administered 2020-08-14: 250 mL via INTRAVENOUS
  Filled 2020-08-14: qty 250

## 2020-08-14 MED ORDER — DIPHENHYDRAMINE HCL 25 MG PO CAPS
25.0000 mg | ORAL_CAPSULE | Freq: Once | ORAL | Status: AC
  Administered 2020-08-14: 25 mg via ORAL

## 2020-08-14 MED ORDER — ACETAMINOPHEN 325 MG PO TABS
ORAL_TABLET | ORAL | Status: AC
  Filled 2020-08-14: qty 2

## 2020-08-14 MED ORDER — SODIUM CHLORIDE 0.9% FLUSH
10.0000 mL | INTRAVENOUS | Status: DC | PRN
  Administered 2020-08-14: 10 mL
  Filled 2020-08-14: qty 10

## 2020-08-14 MED ORDER — DIPHENHYDRAMINE HCL 25 MG PO CAPS
ORAL_CAPSULE | ORAL | Status: AC
  Filled 2020-08-14: qty 1

## 2020-08-14 MED ORDER — HEPARIN SOD (PORK) LOCK FLUSH 100 UNIT/ML IV SOLN
500.0000 [IU] | Freq: Once | INTRAVENOUS | Status: AC | PRN
  Administered 2020-08-14: 500 [IU]
  Filled 2020-08-14: qty 5

## 2020-08-14 MED ORDER — ACETAMINOPHEN 325 MG PO TABS
650.0000 mg | ORAL_TABLET | Freq: Once | ORAL | Status: AC
  Administered 2020-08-14: 650 mg via ORAL

## 2020-08-14 NOTE — Patient Instructions (Signed)

## 2020-08-15 LAB — TYPE AND SCREEN
ABO/RH(D): B NEG
Antibody Screen: NEGATIVE
Unit division: 0
Unit division: 0

## 2020-08-15 LAB — BPAM RBC
Blood Product Expiration Date: 202110142359
Blood Product Expiration Date: 202110152359
ISSUE DATE / TIME: 202109301103
ISSUE DATE / TIME: 202109301103
Unit Type and Rh: 1700
Unit Type and Rh: 1700

## 2020-08-26 ENCOUNTER — Telehealth: Payer: Self-pay

## 2020-08-26 NOTE — Telephone Encounter (Signed)
-----   Message from Wyatt Portela, MD sent at 08/26/2020  1:59 PM EDT ----- Orders for lab are in. Will see the results first and then order blood. Thanks ----- Message ----- From: Kennedy Bucker, LPN Sent: 43/83/7793   1:39 PM EDT To: Wyatt Portela, MD  Do you want to put in lab and blood orders in now?  ----- Message ----- From: Wyatt Portela, MD Sent: 08/26/2020   1:13 PM EDT To: Kennedy Bucker, LPN  Yes. He needs Lab + transfusion in 2 weeks.  Lab + MD in 4 weeks.  ----- Message ----- From: Kennedy Bucker, LPN Sent: 96/88/6484  12:37 PM EDT To: Wyatt Portela, MD  Patient's called asking about two week follow up appointment from when he got blood. Patient states he does feel a little better since getting blood. I will send a scheduling message for follow up if you want me too. Please advise. Thanks  [employer] LPN

## 2020-08-26 NOTE — Telephone Encounter (Signed)
Sent scheduling message 

## 2020-08-28 ENCOUNTER — Telehealth: Payer: Self-pay | Admitting: Oncology

## 2020-08-28 NOTE — Telephone Encounter (Signed)
Scheduled appt per 10/12 schmsg - pt is aware of appt on 10/26 for lab and 10/27 appt in [place] if blood is needed.

## 2020-09-08 DIAGNOSIS — Z8546 Personal history of malignant neoplasm of prostate: Secondary | ICD-10-CM | POA: Diagnosis not present

## 2020-09-08 DIAGNOSIS — Z20822 Contact with and (suspected) exposure to covid-19: Secondary | ICD-10-CM | POA: Diagnosis not present

## 2020-09-08 DIAGNOSIS — G893 Neoplasm related pain (acute) (chronic): Secondary | ICD-10-CM | POA: Diagnosis not present

## 2020-09-08 DIAGNOSIS — R69 Illness, unspecified: Secondary | ICD-10-CM | POA: Diagnosis not present

## 2020-09-08 DIAGNOSIS — R0602 Shortness of breath: Secondary | ICD-10-CM | POA: Diagnosis not present

## 2020-09-08 DIAGNOSIS — R918 Other nonspecific abnormal finding of lung field: Secondary | ICD-10-CM | POA: Diagnosis not present

## 2020-09-08 DIAGNOSIS — E669 Obesity, unspecified: Secondary | ICD-10-CM | POA: Diagnosis not present

## 2020-09-08 DIAGNOSIS — Z85830 Personal history of malignant neoplasm of bone: Secondary | ICD-10-CM | POA: Diagnosis not present

## 2020-09-08 DIAGNOSIS — D6481 Anemia due to antineoplastic chemotherapy: Secondary | ICD-10-CM | POA: Diagnosis not present

## 2020-09-08 DIAGNOSIS — R531 Weakness: Secondary | ICD-10-CM | POA: Diagnosis not present

## 2020-09-08 DIAGNOSIS — D61818 Other pancytopenia: Secondary | ICD-10-CM | POA: Diagnosis not present

## 2020-09-08 DIAGNOSIS — R231 Pallor: Secondary | ICD-10-CM | POA: Diagnosis not present

## 2020-09-08 DIAGNOSIS — C7800 Secondary malignant neoplasm of unspecified lung: Secondary | ICD-10-CM | POA: Diagnosis not present

## 2020-09-08 DIAGNOSIS — R079 Chest pain, unspecified: Secondary | ICD-10-CM | POA: Diagnosis not present

## 2020-09-08 DIAGNOSIS — D649 Anemia, unspecified: Secondary | ICD-10-CM | POA: Diagnosis not present

## 2020-09-08 DIAGNOSIS — C61 Malignant neoplasm of prostate: Secondary | ICD-10-CM | POA: Diagnosis not present

## 2020-09-08 DIAGNOSIS — C801 Malignant (primary) neoplasm, unspecified: Secondary | ICD-10-CM | POA: Diagnosis not present

## 2020-09-08 DIAGNOSIS — C7951 Secondary malignant neoplasm of bone: Secondary | ICD-10-CM | POA: Diagnosis not present

## 2020-09-08 DIAGNOSIS — K7689 Other specified diseases of liver: Secondary | ICD-10-CM | POA: Diagnosis not present

## 2020-09-08 DIAGNOSIS — G620 Drug-induced polyneuropathy: Secondary | ICD-10-CM | POA: Diagnosis not present

## 2020-09-08 DIAGNOSIS — Z79899 Other long term (current) drug therapy: Secondary | ICD-10-CM | POA: Diagnosis not present

## 2020-09-09 ENCOUNTER — Inpatient Hospital Stay: Payer: Medicare HMO

## 2020-09-09 ENCOUNTER — Telehealth: Payer: Self-pay | Admitting: *Deleted

## 2020-09-09 DIAGNOSIS — D61818 Other pancytopenia: Secondary | ICD-10-CM | POA: Diagnosis not present

## 2020-09-09 DIAGNOSIS — Z95828 Presence of other vascular implants and grafts: Secondary | ICD-10-CM | POA: Diagnosis not present

## 2020-09-09 DIAGNOSIS — D649 Anemia, unspecified: Secondary | ICD-10-CM | POA: Diagnosis not present

## 2020-09-09 DIAGNOSIS — G893 Neoplasm related pain (acute) (chronic): Secondary | ICD-10-CM | POA: Diagnosis not present

## 2020-09-09 DIAGNOSIS — I10 Essential (primary) hypertension: Secondary | ICD-10-CM | POA: Diagnosis not present

## 2020-09-09 DIAGNOSIS — C61 Malignant neoplasm of prostate: Secondary | ICD-10-CM | POA: Diagnosis not present

## 2020-09-09 DIAGNOSIS — R0602 Shortness of breath: Secondary | ICD-10-CM | POA: Diagnosis not present

## 2020-09-09 DIAGNOSIS — G620 Drug-induced polyneuropathy: Secondary | ICD-10-CM | POA: Diagnosis not present

## 2020-09-09 NOTE — Telephone Encounter (Signed)
Patient wife called. She wanted to cancel his lab appt for today and his transfusion appt for tomorrow. He was admitted to [facility] over weekend with DOE. Hgb 5.7 on admission. Has received total of 3 units PRBCs. Notes and Lab results in [place].  His next appt with Dr. Alen Blew is 11/9 with labs, with transfusion the next day if needed. She wants to know if he needs to be seen by Dr. Alen Blew earlier. Message routed to MD

## 2020-09-09 NOTE — Telephone Encounter (Signed)
11/9 will be appropriate at this time.

## 2020-09-10 ENCOUNTER — Inpatient Hospital Stay: Payer: Medicare HMO

## 2020-09-10 DIAGNOSIS — Z95828 Presence of other vascular implants and grafts: Secondary | ICD-10-CM | POA: Diagnosis not present

## 2020-09-10 DIAGNOSIS — C61 Malignant neoplasm of prostate: Secondary | ICD-10-CM | POA: Diagnosis not present

## 2020-09-10 DIAGNOSIS — I10 Essential (primary) hypertension: Secondary | ICD-10-CM | POA: Diagnosis not present

## 2020-09-10 DIAGNOSIS — D649 Anemia, unspecified: Secondary | ICD-10-CM | POA: Diagnosis not present

## 2020-09-10 DIAGNOSIS — D61818 Other pancytopenia: Secondary | ICD-10-CM | POA: Diagnosis not present

## 2020-09-10 DIAGNOSIS — R0602 Shortness of breath: Secondary | ICD-10-CM | POA: Diagnosis not present

## 2020-09-10 DIAGNOSIS — G893 Neoplasm related pain (acute) (chronic): Secondary | ICD-10-CM | POA: Diagnosis not present

## 2020-09-10 DIAGNOSIS — G620 Drug-induced polyneuropathy: Secondary | ICD-10-CM | POA: Diagnosis not present

## 2020-09-11 ENCOUNTER — Other Ambulatory Visit: Payer: Self-pay | Admitting: Oncology

## 2020-09-11 DIAGNOSIS — C61 Malignant neoplasm of prostate: Secondary | ICD-10-CM

## 2020-09-11 DIAGNOSIS — C7951 Secondary malignant neoplasm of bone: Secondary | ICD-10-CM

## 2020-09-11 MED ORDER — HYDROCODONE-ACETAMINOPHEN 5-325 MG PO TABS: 1.0000 | ORAL_TABLET | Freq: Four times a day (QID) | ORAL | 0 refills | Status: DC | PRN

## 2020-09-11 MED ORDER — FENTANYL 25 MCG/HR TD PT72: 1.0000 | MEDICATED_PATCH | TRANSDERMAL | 0 refills | Status: DC

## 2020-09-15 ENCOUNTER — Telehealth: Payer: Self-pay | Admitting: *Deleted

## 2020-09-15 NOTE — Telephone Encounter (Signed)
I can see him 11/2 at 9:00 am after labs. Please check on transfusion part and send message to scheduling. Thanks

## 2020-09-15 NOTE — Telephone Encounter (Signed)
Patient called stated that he had to go to [facility] center 09/07/2020 with severe shortness of breath and was found to have hgb of 4.0.  They transfused him with several units.    He is calling today because he is due to come in on next Tuesday for labs/MD and then transfusion the next day.  He states he is starting to feel short of breath, and wants to see if he can move up his lab/MD and transfusion to today or tomorrow if at all possible.  Denies any active bleeding presently.  Routed to MD to advise.

## 2020-09-16 ENCOUNTER — Other Ambulatory Visit: Payer: Self-pay

## 2020-09-16 ENCOUNTER — Inpatient Hospital Stay (HOSPITAL_BASED_OUTPATIENT_CLINIC_OR_DEPARTMENT_OTHER): Payer: Medicare HMO | Admitting: Oncology

## 2020-09-16 ENCOUNTER — Other Ambulatory Visit: Payer: Self-pay | Admitting: *Deleted

## 2020-09-16 ENCOUNTER — Inpatient Hospital Stay: Payer: Medicare HMO

## 2020-09-16 ENCOUNTER — Inpatient Hospital Stay: Payer: Medicare HMO | Attending: Oncology

## 2020-09-16 VITALS — BP 116/61 | HR 87 | Temp 98.1°F | Resp 20 | Ht 75.0 in | Wt 320.8 lb

## 2020-09-16 DIAGNOSIS — C61 Malignant neoplasm of prostate: Secondary | ICD-10-CM | POA: Insufficient documentation

## 2020-09-16 DIAGNOSIS — Z9079 Acquired absence of other genital organ(s): Secondary | ICD-10-CM | POA: Insufficient documentation

## 2020-09-16 DIAGNOSIS — C7951 Secondary malignant neoplasm of bone: Secondary | ICD-10-CM | POA: Diagnosis not present

## 2020-09-16 DIAGNOSIS — Z7189 Other specified counseling: Secondary | ICD-10-CM

## 2020-09-16 DIAGNOSIS — Z9221 Personal history of antineoplastic chemotherapy: Secondary | ICD-10-CM | POA: Diagnosis not present

## 2020-09-16 DIAGNOSIS — D630 Anemia in neoplastic disease: Secondary | ICD-10-CM | POA: Diagnosis not present

## 2020-09-16 DIAGNOSIS — Z923 Personal history of irradiation: Secondary | ICD-10-CM | POA: Insufficient documentation

## 2020-09-16 LAB — PREPARE RBC (CROSSMATCH)

## 2020-09-16 LAB — CBC WITH DIFFERENTIAL (CANCER CENTER ONLY)
Abs Immature Granulocytes: 0.11 10*3/uL — ABNORMAL HIGH (ref 0.00–0.07)
Basophils Absolute: 0 10*3/uL (ref 0.0–0.1)
Basophils Relative: 1 %
Eosinophils Absolute: 0.1 10*3/uL (ref 0.0–0.5)
Eosinophils Relative: 7 %
HCT: 27.4 % — ABNORMAL LOW (ref 39.0–52.0)
Hemoglobin: 8.9 g/dL — ABNORMAL LOW (ref 13.0–17.0)
Immature Granulocytes: 6 %
Lymphocytes Relative: 17 %
Lymphs Abs: 0.3 10*3/uL — ABNORMAL LOW (ref 0.7–4.0)
MCH: 28.3 pg (ref 26.0–34.0)
MCHC: 32.5 g/dL (ref 30.0–36.0)
MCV: 87 fL (ref 80.0–100.0)
Monocytes Absolute: 0.2 10*3/uL (ref 0.1–1.0)
Monocytes Relative: 8 %
Neutro Abs: 1.2 10*3/uL — ABNORMAL LOW (ref 1.7–7.7)
Neutrophils Relative %: 61 %
Platelet Count: 31 10*3/uL — ABNORMAL LOW (ref 150–400)
RBC: 3.15 MIL/uL — ABNORMAL LOW (ref 4.22–5.81)
RDW: 17.8 % — ABNORMAL HIGH (ref 11.5–15.5)
WBC Count: 2 10*3/uL — ABNORMAL LOW (ref 4.0–10.5)
nRBC: 1 % — ABNORMAL HIGH (ref 0.0–0.2)

## 2020-09-16 LAB — CMP (CANCER CENTER ONLY)
ALT: 21 U/L (ref 0–44)
AST: 19 U/L (ref 15–41)
Albumin: 3.4 g/dL — ABNORMAL LOW (ref 3.5–5.0)
Alkaline Phosphatase: 332 U/L — ABNORMAL HIGH (ref 38–126)
Anion gap: 8 (ref 5–15)
BUN: 14 mg/dL (ref 8–23)
CO2: 28 mmol/L (ref 22–32)
Calcium: 8.1 mg/dL — ABNORMAL LOW (ref 8.9–10.3)
Chloride: 104 mmol/L (ref 98–111)
Creatinine: 0.72 mg/dL (ref 0.61–1.24)
GFR, Estimated: >60 mL/min (ref >60)
Glucose, Bld: 127 mg/dL — ABNORMAL HIGH (ref 70–99)
Potassium: 3.8 mmol/L (ref 3.5–5.1)
Sodium: 140 mmol/L (ref 135–145)
Total Bilirubin: 0.5 mg/dL (ref 0.3–1.2)
Total Protein: 6.5 g/dL (ref 6.5–8.1)

## 2020-09-16 LAB — SAMPLE TO BLOOD BANK

## 2020-09-16 MED ORDER — DIPHENHYDRAMINE HCL 25 MG PO CAPS
ORAL_CAPSULE | ORAL | Status: AC
  Filled 2020-09-16: qty 1

## 2020-09-16 MED ORDER — HEPARIN SOD (PORK) LOCK FLUSH 100 UNIT/ML IV SOLN
500.0000 [IU] | Freq: Every day | INTRAVENOUS | Status: AC | PRN
  Administered 2020-09-16: 500 [IU]
  Filled 2020-09-16: qty 5

## 2020-09-16 MED ORDER — SODIUM CHLORIDE 0.9% IV SOLUTION
250.0000 mL | Freq: Once | INTRAVENOUS | Status: AC
  Administered 2020-09-16: 250 mL via INTRAVENOUS
  Filled 2020-09-16: qty 250

## 2020-09-16 MED ORDER — DIPHENHYDRAMINE HCL 25 MG PO CAPS
25.0000 mg | ORAL_CAPSULE | Freq: Once | ORAL | Status: AC
  Administered 2020-09-16: 25 mg via ORAL

## 2020-09-16 MED ORDER — ACETAMINOPHEN 325 MG PO TABS
ORAL_TABLET | ORAL | Status: AC
  Filled 2020-09-16: qty 2

## 2020-09-16 MED ORDER — SODIUM CHLORIDE 0.9% FLUSH
10.0000 mL | INTRAVENOUS | Status: AC | PRN
  Administered 2020-09-16: 10 mL
  Filled 2020-09-16: qty 10

## 2020-09-16 MED ORDER — ACETAMINOPHEN 325 MG PO TABS
650.0000 mg | ORAL_TABLET | Freq: Once | ORAL | Status: AC
  Administered 2020-09-16: 650 mg via ORAL

## 2020-09-16 NOTE — Patient Instructions (Signed)

## 2020-09-16 NOTE — Progress Notes (Signed)
Hematology and Oncology Follow Up Visit  Gregory Schneider [id number] 09-27-1956 64 y.o. 09/16/2020 8:42 AM   Principle Diagnosis: 64 year old man with advanced prostate cancer with disease to the bone diagnosed in 2013.  He has castration-resistant with Guardant 360 analysis showed his tumor harbors BRAF and EGFR.    Prior Therapy:  Combined androgen deprivation with Lupron and Casodex. He had a good response and then developed a rise in the PSA with castrate level testosterone.  He is S/P Bilateral simple orchiectomy done on 12/25/2012.  Provenge started on 03/03/12. Therapy Ended in 03/31/2012. Case Zytiga started in 10/2013. Therapy discontinued in January 2018 because of progression of disease. Xofigo infusion to start on Apr 13, 2018.  He completed 6 months of therapy in November 2019. Status post prostate radiation between December 19, 2018 and January 01, 2019.  He received total of 30 Gy in 10 fractions to the prostate and the pubic rami. Xtandi 160 mg daily started in February 2018.  Therapy discontinued in October 2020 because of progression of disease.  Taxotere chemotherapy started on September 13, 2019. He completed 9 cycles of therapy in May 2021.   Current therapy: Supportive care only.      Interim History:  [name] is here for repeat evaluation.  Since the last visit, he required multiple psychosocial transfusion received at [facility].  Clinically, he reports no major changes in his symptoms.  He did report his dyspnea on exertion is improved slightly.  His performance status is overall poor and has been limited to chair or bed most of the time.  His pain is manageable currently.  He uses a patch and pain medication orally for breakthrough.  Denies any recent falls or syncope.     Medications: Updated on review. Current Outpatient Medications  Medication Sig Dispense Refill  . amLODipine (NORVASC) 2.5 MG tablet Take 2.5 mg by mouth daily.    Marland Kitchen buPROPion (ZYBAN) 150  MG 12 hr tablet Take 150 mg by mouth.    . calcium carbonate (OS-CAL) 600 MG TABS Take 600 mg by mouth daily.    . cholecalciferol (VITAMIN D) 1000 UNITS tablet Take 1,000 Units by mouth daily.    . clonazePAM (KLONOPIN) 1 MG tablet Take 1 mg by mouth as needed for anxiety (1-2 tablets daily as needed).     . Cyanocobalamin (RA VITAMIN B-12 TR) 1000 MCG TBCR Take by mouth.    . dabrafenib mesylate (TAFINLAR) 75 MG capsule Take 2 capsules (150 mg total) by mouth 2 (two) times daily. Take on an empty stomach 1 hour before or 2 hours after meals. 120 capsule 0  . fentaNYL (DURAGESIC) 25 MCG/HR Place 1 patch onto the skin every other day. 15 patch 0  . gabapentin (NEURONTIN) 300 MG capsule Take 3 capsules (900 mg total) by mouth 2 (two) times daily. 180 capsule 3  . HYDROcodone-acetaminophen (NORCO/VICODIN) 5-325 MG tablet Take 1-2 tablets by mouth every 6 (six) hours as needed. 90 tablet 0  . lidocaine-prilocaine (EMLA) cream Apply 1 application topically as needed. 30 g 0  . losartan-hydrochlorothiazide (HYZAAR) 100-25 MG tablet   0  . Multiple Vitamin (MULTIVITAMIN) tablet Take 1 tablet by mouth daily.    . prochlorperazine (COMPAZINE) 10 MG tablet Take 1 tablet (10 mg total) by mouth every 6 (six) hours as needed for nausea or vomiting. 30 tablet 3  . sertraline (ZOLOFT) 100 MG tablet Take 200 mg by mouth every morning.     . trametinib dimethyl  sulfoxide (MEKINIST) 2 MG tablet Take 1 tablet (2 mg total) by mouth daily. Take 1 hour before or 2 hours after a meal. Store refrigerated in original container. 30 tablet 0  . traZODone (DESYREL) 50 MG tablet TAKE 1 TABLET BY MOUTH EVERY DAY AT NIGHT     No current facility-administered medications for this visit.   Facility-Administered Medications Ordered in Other Visits  Medication Dose Route Frequency Provider Last Rate Last Admin  . sodium chloride flush (NS) 0.9 % injection 10 mL  10 mL Intracatheter PRN Wyatt Portela, MD   10 mL at 07/25/20  1554    Allergies:  Allergies  Allergen Reactions  . Penicillins Other (See Comments)    Patient stated,"I don't remember what kind of reaction because I was a kid."      Physical Exam:    Blood pressure 116/61, pulse 87, temperature 98.1 F (36.7 C), temperature source Tympanic, resp. rate 20, height $RemoveBe'6\' 3"'hWrvEAVTV$  (1.905 m), weight (!) 320 lb 12.8 oz (145.5 kg), SpO2 99 %.         ECOG: 2   General appearance: Alert, awake without any distress. Head: Atraumatic without abnormalities Oropharynx: Without any thrush or ulcers. Eyes: No scleral icterus. Lymph nodes: No lymphadenopathy noted in the cervical, supraclavicular, or axillary nodes Heart:regular rate and rhythm, without any murmurs or gallops.   Lung: Clear to auscultation without any rhonchi, wheezes or dullness to percussion. Abdomin: Soft, nontender without any shifting dullness or ascites. Musculoskeletal: No clubbing or cyanosis. Neurological: No motor or sensory deficits. Skin: No rashes or lesions.        .                 Lab Results: Lab Results  Component Value Date   WBC 2.5 (L) 08/12/2020   HGB 6.6 (LL) 08/12/2020   HCT 20.8 (L) 08/12/2020   MCV 85.2 08/12/2020   PLT 66 (L) 08/12/2020     Chemistry      Component Value Date/Time   NA 134 (L) 08/12/2020 1515   NA 136 11/17/2017 0956   K 4.2 08/12/2020 1515   K 3.8 11/17/2017 0956   CL 100 08/12/2020 1515   CL 104 03/09/2013 0921   CO2 27 08/12/2020 1515   CO2 28 11/17/2017 0956   BUN 16 08/12/2020 1515   BUN 19.7 11/17/2017 0956   CREATININE 0.82 08/12/2020 1515   CREATININE 0.9 11/17/2017 0956      Component Value Date/Time   CALCIUM 8.9 08/12/2020 1515   CALCIUM 9.5 11/17/2017 0956   ALKPHOS 326 (H) 08/12/2020 1515   ALKPHOS 89 11/17/2017 0956   AST 18 08/12/2020 1515   AST 21 11/17/2017 0956   ALT 20 08/12/2020 1515   ALT 47 11/17/2017 0956   BILITOT 0.4 08/12/2020 1515   BILITOT 0.39 11/17/2017 0956             Results for WEN, MERCED (MRN [id number]) as of 09/16/2020 08:55  Ref. Range 07/10/2020 14:58 07/25/2020 09:05 08/12/2020 15:15  Prostate Specific Ag, Serum Latest Ref Range: 0.0 - 4.0 ng/mL 497.0 (H) 607.0 (H) 617.0 (H)       Impression and Plan:    64 year old man with  1. Advanced prostate cancer with disease to the bone diagnosed in 2013. 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  His disease status was updated today including review of his laboratory testing. His PSA continues to rise and continues to be transfusion dependent likely related to infiltrative bone marrow  process. Treatment options were discussed at this time. Targeting agent or EGFR such as cetuximab as an off label trial would be an option versus supportive care and hospice.  At this time, we have opted to proceed with supportive care only with consideration of hospice enrollment if you experience further decline.  2. Androgen deprivation: No additional androgen deprivation is needed status post orchiectomy.   3.  Anemia: He continues to be transfusion dependent likely due to marrow failure from bone marrow involvement with prostate cancer and likely Xofigo exposure.    4.  Prognosis and goals of care: His prognosis is overall poor with limited life expectancy given his disease progression.    5  Bone pain: Manageable at this time with the current pain regimen.  6.  Dyspnea: Related to malignancy and anemia.  7. Follow up: In 1 week for repeat laboratory testing and transfusion.  30 minutes were dedicated to this encounter. The time was spent on reviewing disease status, treatment options, reviewing prognosis and future plan of care.  Zola Button MD 11/2/20218:42 AM

## 2020-09-17 LAB — PROSTATE-SPECIFIC AG, SERUM (LABCORP): Prostate Specific Ag, Serum: 601 ng/mL — ABNORMAL HIGH (ref 0.0–4.0)

## 2020-09-17 LAB — TYPE AND SCREEN
ABO/RH(D): B NEG
Antibody Screen: NEGATIVE
Unit division: 0

## 2020-09-17 LAB — BPAM RBC
Blood Product Expiration Date: 202111282359
ISSUE DATE / TIME: 202111021103
Unit Type and Rh: 1700

## 2020-09-19 ENCOUNTER — Telehealth: Payer: Self-pay

## 2020-09-19 NOTE — Telephone Encounter (Signed)
-----   Message from Wyatt Portela, MD sent at 09/19/2020 11:49 AM EDT ----- He had blood earlier this week and will be checked next week. No need for check today. Thanks ----- Message ----- From: Kennedy Bucker, LPN Sent: 18/02/374  11:45 AM EDT To: Wyatt Portela, MD  Patient called complaining of having shortness of breath again like he did the week before and he had to go in and get blood. Patient questioned having blood drawn today to check hemoglobin for need for blood.   Kim LPN

## 2020-09-19 NOTE — Telephone Encounter (Signed)
Spoke with patient per Dr Alen Blew. Patient verbalized understanding.

## 2020-09-23 ENCOUNTER — Inpatient Hospital Stay: Payer: Medicare HMO

## 2020-09-23 ENCOUNTER — Inpatient Hospital Stay (HOSPITAL_BASED_OUTPATIENT_CLINIC_OR_DEPARTMENT_OTHER): Payer: Medicare HMO | Admitting: Oncology

## 2020-09-23 ENCOUNTER — Other Ambulatory Visit: Payer: Self-pay

## 2020-09-23 ENCOUNTER — Telehealth: Payer: Self-pay

## 2020-09-23 DIAGNOSIS — C61 Malignant neoplasm of prostate: Secondary | ICD-10-CM

## 2020-09-23 DIAGNOSIS — C7951 Secondary malignant neoplasm of bone: Secondary | ICD-10-CM

## 2020-09-23 LAB — CMP (CANCER CENTER ONLY)
ALT: 24 U/L (ref 0–44)
AST: 21 U/L (ref 15–41)
Albumin: 3.5 g/dL (ref 3.5–5.0)
Alkaline Phosphatase: 355 U/L — ABNORMAL HIGH (ref 38–126)
Anion gap: 9 (ref 5–15)
BUN: 17 mg/dL (ref 8–23)
CO2: 30 mmol/L (ref 22–32)
Calcium: 8.2 mg/dL — ABNORMAL LOW (ref 8.9–10.3)
Chloride: 104 mmol/L (ref 98–111)
Creatinine: 0.78 mg/dL (ref 0.61–1.24)
GFR, Estimated: >60 mL/min (ref >60)
Glucose, Bld: 130 mg/dL — ABNORMAL HIGH (ref 70–99)
Potassium: 3.6 mmol/L (ref 3.5–5.1)
Sodium: 143 mmol/L (ref 135–145)
Total Bilirubin: 0.6 mg/dL (ref 0.3–1.2)
Total Protein: 6.6 g/dL (ref 6.5–8.1)

## 2020-09-23 LAB — CBC WITH DIFFERENTIAL (CANCER CENTER ONLY)
Abs Immature Granulocytes: 0.15 10*3/uL — ABNORMAL HIGH (ref 0.00–0.07)
Basophils Absolute: 0 10*3/uL (ref 0.0–0.1)
Basophils Relative: 1 %
Eosinophils Absolute: 0.1 10*3/uL (ref 0.0–0.5)
Eosinophils Relative: 6 %
HCT: 27.4 % — ABNORMAL LOW (ref 39.0–52.0)
Hemoglobin: 9 g/dL — ABNORMAL LOW (ref 13.0–17.0)
Immature Granulocytes: 7 %
Lymphocytes Relative: 15 %
Lymphs Abs: 0.3 10*3/uL — ABNORMAL LOW (ref 0.7–4.0)
MCH: 28.7 pg (ref 26.0–34.0)
MCHC: 32.8 g/dL (ref 30.0–36.0)
MCV: 87.3 fL (ref 80.0–100.0)
Monocytes Absolute: 0.2 10*3/uL (ref 0.1–1.0)
Monocytes Relative: 8 %
Neutro Abs: 1.4 10*3/uL — ABNORMAL LOW (ref 1.7–7.7)
Neutrophils Relative %: 63 %
Platelet Count: 34 10*3/uL — ABNORMAL LOW (ref 150–400)
RBC: 3.14 MIL/uL — ABNORMAL LOW (ref 4.22–5.81)
RDW: 17.1 % — ABNORMAL HIGH (ref 11.5–15.5)
WBC Count: 2.2 10*3/uL — ABNORMAL LOW (ref 4.0–10.5)
nRBC: 1.4 % — ABNORMAL HIGH (ref 0.0–0.2)

## 2020-09-23 LAB — SAMPLE TO BLOOD BANK

## 2020-09-23 LAB — PREPARE RBC (CROSSMATCH)

## 2020-09-23 MED ORDER — MORPHINE SULFATE 15 MG PO TABS
15.0000 mg | ORAL_TABLET | ORAL | 0 refills | Status: AC | PRN
Start: 2020-09-23 — End: ?

## 2020-09-23 MED ORDER — FENTANYL 25 MCG/HR TD PT72: 2.0000 | MEDICATED_PATCH | TRANSDERMAL | 0 refills | Status: DC

## 2020-09-23 NOTE — Telephone Encounter (Signed)
Spoke to Gregory Schneider in blood bank to confirm orders received for blood transfusion tomorrow- one unit.

## 2020-09-23 NOTE — Progress Notes (Signed)
Hematology and Oncology Follow Up Visit  Gregory Schneider [id number] [birth year] 64 y.o. 09/23/2020 8:15 AM   Principle Diagnosis: 64 year old man with castration-resistant prostate cancer with disease to the bone diagnosed in 2013.  He is approaching end-stage status.    Prior Therapy:  Combined androgen deprivation with Lupron and Casodex. He had a good response and then developed a rise in the PSA with castrate level testosterone.  He is S/P Bilateral simple orchiectomy done on 12/25/2012.  Provenge started on 03/03/12. Therapy Ended in 03/31/2012. Case Zytiga started in 10/2013. Therapy discontinued in January 2018 because of progression of disease. Xofigo infusion to start on Apr 13, 2018.  He completed 6 months of therapy in November 2019. Status post prostate radiation between December 19, 2018 and January 01, 2019.  He received total of 30 Gy in 10 fractions to the prostate and the pubic rami. Xtandi 160 mg daily started in February 2018.  Therapy discontinued in October 2020 because of progression of disease.  Taxotere chemotherapy started on September 13, 2019. He completed 9 cycles of therapy in May 2021.   Current therapy: Supportive care only.      Interim History:  Gregory Schneider returns today for a follow-up visit.  Since her last visit, he received transfusion 1 unit of packed red cells last week with mild improvement in his symptoms.  He reports his symptoms have recurred and predominantly dyspnea associated with anxiety and unable to get adequate breath.  A lot of it stems from worsening pain predominantly in the bone.  He does use fentanyl patch although feels at times its less effective and has been using a lot of hydrocodone for breakthrough pain medication.  Overall performance status has been limited and continues to decline.     Medications: Reviewed today without changes. Current Outpatient Medications  Medication Sig Dispense Refill  . amLODipine (NORVASC) 2.5 MG tablet  Take 2.5 mg by mouth daily.    Marland Kitchen buPROPion (ZYBAN) 150 MG 12 hr tablet Take 150 mg by mouth.    . calcium carbonate (OS-CAL) 600 MG TABS Take 600 mg by mouth daily.    . cholecalciferol (VITAMIN D) 1000 UNITS tablet Take 1,000 Units by mouth daily.    . clonazePAM (KLONOPIN) 1 MG tablet Take 1 mg by mouth as needed for anxiety (1-2 tablets daily as needed).     . Cyanocobalamin (RA VITAMIN B-12 TR) 1000 MCG TBCR Take by mouth.    . dabrafenib mesylate (TAFINLAR) 75 MG capsule Take 2 capsules (150 mg total) by mouth 2 (two) times daily. Take on an empty stomach 1 hour before or 2 hours after meals. 120 capsule 0  . fentaNYL (DURAGESIC) 25 MCG/HR Place 1 patch onto the skin every other day. 15 patch 0  . gabapentin (NEURONTIN) 300 MG capsule Take 3 capsules (900 mg total) by mouth 2 (two) times daily. 180 capsule 3  . HYDROcodone-acetaminophen (NORCO/VICODIN) 5-325 MG tablet Take 1-2 tablets by mouth every 6 (six) hours as needed. 90 tablet 0  . lidocaine-prilocaine (EMLA) cream Apply 1 application topically as needed. 30 g 0  . losartan (COZAAR) 100 MG tablet Take by mouth.    . Multiple Vitamin (MULTIVITAMIN) tablet Take 1 tablet by mouth daily.    . naproxen sodium (ALEVE) 220 MG tablet Take by mouth.    . prochlorperazine (COMPAZINE) 10 MG tablet Take 1 tablet (10 mg total) by mouth every 6 (six) hours as needed for nausea or vomiting. 30 tablet 3  .  sertraline (ZOLOFT) 100 MG tablet Take 200 mg by mouth every morning.     . trametinib dimethyl sulfoxide (MEKINIST) 2 MG tablet Take 1 tablet (2 mg total) by mouth daily. Take 1 hour before or 2 hours after a meal. Store refrigerated in original container. 30 tablet 0  . traZODone (DESYREL) 50 MG tablet TAKE 1 TABLET BY MOUTH EVERY DAY AT NIGHT     No current facility-administered medications for this visit.   Facility-Administered Medications Ordered in Other Visits  Medication Dose Route Frequency Provider Last Rate Last Admin  . sodium  chloride flush (NS) 0.9 % injection 10 mL  10 mL Intracatheter PRN Wyatt Portela, MD   10 mL at 07/25/20 1554    Allergies:  Allergies  Allergen Reactions  . Penicillins Other (See Comments)    Patient stated,"I don't remember what kind of reaction because I was a kid."      Physical Exam:     Blood pressure (!) 148/81, pulse 91, temperature (!) 96.4 F (35.8 C), temperature source Tympanic, resp. rate 18, height 6\' 3"  (1.905 m), weight (!) 318 lb 3.2 oz (144.3 kg), SpO2 96 %.        ECOG: 2     General appearance: Comfortable appearing without any discomfort Head: Normocephalic without any trauma Oropharynx: Mucous membranes are moist and pink without any thrush or ulcers. Eyes: Pupils are equal and round reactive to light. Lymph nodes: No cervical, supraclavicular, inguinal or axillary lymphadenopathy.   Heart:regular rate and rhythm.  S1 and S2 without leg edema. Lung: Clear without any rhonchi or wheezes.  No dullness to percussion. Abdomin: Soft, nontender, nondistended with good bowel sounds.  No hepatosplenomegaly. Musculoskeletal: No joint deformity or effusion.  Full range of motion noted. Neurological: No deficits noted on motor, sensory and deep tendon reflex exam. Skin: No petechial rash or dryness.  Appeared moist.         .                 Lab Results: Lab Results  Component Value Date   WBC 2.0 (L) 09/16/2020   HGB 8.9 (L) 09/16/2020   HCT 27.4 (L) 09/16/2020   MCV 87.0 09/16/2020   PLT 31 (L) 09/16/2020     Chemistry      Component Value Date/Time   NA 140 09/16/2020 0845   NA 136 11/17/2017 0956   K 3.8 09/16/2020 0845   K 3.8 11/17/2017 0956   CL 104 09/16/2020 0845   CL 104 03/09/2013 0921   CO2 28 09/16/2020 0845   CO2 28 11/17/2017 0956   BUN 14 09/16/2020 0845   BUN 19.7 11/17/2017 0956   CREATININE 0.72 09/16/2020 0845   CREATININE 0.9 11/17/2017 0956      Component Value Date/Time   CALCIUM 8.1 (L)  09/16/2020 0845   CALCIUM 9.5 11/17/2017 0956   ALKPHOS 332 (H) 09/16/2020 0845   ALKPHOS 89 11/17/2017 0956   AST 19 09/16/2020 0845   AST 21 11/17/2017 0956   ALT 21 09/16/2020 0845   ALT 47 11/17/2017 0956   BILITOT 0.5 09/16/2020 0845   BILITOT 0.39 11/17/2017 0956             Results for MATTHEWJAMES, PETRASEK (MRN [id number]) as of 09/23/2020 08:16  Ref. Range 08/12/2020 15:15 09/16/2020 08:45  Prostate Specific Ag, Serum Latest Ref Range: 0.0 - 4.0 ng/mL 617.0 (H) 601.0 (H)       Impression and Plan:    64 year old  man with  1.  Castration-resistant prostate cancer with extensive bone and bone marrow involvement.    His disease status was updated in his performance status remains marginal with severe cytopenia indicating worsening bone marrow involvement with his prostate cancer.  Additional therapy will likely be more toxic without any meaningful benefit.  I recommended supportive care only moving forward with hospice enrollment if he has further decline.    2.  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  His hemoglobin is stable but has been quickly declining in the past and will arrange for 1 unit of packed red cell and frequent checkups in between and transfusion as needed.    3.  Prognosis and goals of care: Prognosis remains poor and not a candidate for any additional therapy.  The role of hospice was also discussed at this time.   4  Bone pain: Continues to be on a fentanyl patch with breakthrough hydrocodone as needed.  I will increase his fentanyl to 50 mcg and use short acting morphine for breakthrough pain medications instead of hydrocodone.    5. Follow up: He will follow every 2 weeks for laboratory testing and MD follow-up in 6 weeks.  30 minutes were d spent on this visit.  The time was dedicated to updating his disease status, discussing treatment  options and addressing complications related to his cancer and cancer therapy.  Zola Button MD 11/9/20218:15 AM

## 2020-09-24 ENCOUNTER — Other Ambulatory Visit: Payer: Self-pay

## 2020-09-24 ENCOUNTER — Inpatient Hospital Stay: Payer: Medicare HMO

## 2020-09-24 DIAGNOSIS — C61 Malignant neoplasm of prostate: Secondary | ICD-10-CM

## 2020-09-24 LAB — PROSTATE-SPECIFIC AG, SERUM (LABCORP): Prostate Specific Ag, Serum: 685 ng/mL — ABNORMAL HIGH (ref 0.0–4.0)

## 2020-09-24 MED ORDER — SODIUM CHLORIDE 0.9% IV SOLUTION
250.0000 mL | Freq: Once | INTRAVENOUS | Status: AC
  Administered 2020-09-24: 250 mL via INTRAVENOUS
  Filled 2020-09-24: qty 250

## 2020-09-24 MED ORDER — DIPHENHYDRAMINE HCL 25 MG PO CAPS
25.0000 mg | ORAL_CAPSULE | Freq: Once | ORAL | Status: AC
  Administered 2020-09-24: 25 mg via ORAL

## 2020-09-24 MED ORDER — SODIUM CHLORIDE 0.9% FLUSH
10.0000 mL | INTRAVENOUS | Status: AC | PRN
  Administered 2020-09-24: 10 mL
  Filled 2020-09-24: qty 10

## 2020-09-24 MED ORDER — HEPARIN SOD (PORK) LOCK FLUSH 100 UNIT/ML IV SOLN
500.0000 [IU] | INTRAVENOUS | Status: AC | PRN
  Administered 2020-09-24: 500 [IU]
  Filled 2020-09-24: qty 5

## 2020-09-24 MED ORDER — DIPHENHYDRAMINE HCL 25 MG PO CAPS
ORAL_CAPSULE | ORAL | Status: AC
  Filled 2020-09-24: qty 1

## 2020-09-24 MED ORDER — ACETAMINOPHEN 325 MG PO TABS
650.0000 mg | ORAL_TABLET | Freq: Once | ORAL | Status: AC
  Administered 2020-09-24: 650 mg via ORAL

## 2020-09-24 MED ORDER — ACETAMINOPHEN 325 MG PO TABS
ORAL_TABLET | ORAL | Status: AC
  Filled 2020-09-24: qty 2

## 2020-09-24 NOTE — Patient Instructions (Signed)

## 2020-09-25 LAB — TYPE AND SCREEN
ABO/RH(D): B NEG
Antibody Screen: NEGATIVE
Unit division: 0

## 2020-09-25 LAB — BPAM RBC
Blood Product Expiration Date: 202112102359
ISSUE DATE / TIME: 202111101321
Unit Type and Rh: 1700

## 2020-10-07 ENCOUNTER — Other Ambulatory Visit: Payer: Medicare HMO

## 2020-10-13 ENCOUNTER — Inpatient Hospital Stay: Payer: Medicare HMO

## 2020-10-13 ENCOUNTER — Telehealth: Payer: Self-pay | Admitting: *Deleted

## 2020-10-13 ENCOUNTER — Other Ambulatory Visit: Payer: Self-pay | Admitting: *Deleted

## 2020-10-13 ENCOUNTER — Other Ambulatory Visit: Payer: Self-pay

## 2020-10-13 ENCOUNTER — Other Ambulatory Visit: Payer: Self-pay | Admitting: Oncology

## 2020-10-13 ENCOUNTER — Telehealth: Payer: Self-pay

## 2020-10-13 DIAGNOSIS — D638 Anemia in other chronic diseases classified elsewhere: Secondary | ICD-10-CM

## 2020-10-13 DIAGNOSIS — C61 Malignant neoplasm of prostate: Secondary | ICD-10-CM

## 2020-10-13 DIAGNOSIS — C7951 Secondary malignant neoplasm of bone: Secondary | ICD-10-CM

## 2020-10-13 LAB — CBC WITH DIFFERENTIAL (CANCER CENTER ONLY)
Abs Immature Granulocytes: 0.12 10*3/uL — ABNORMAL HIGH (ref 0.00–0.07)
Basophils Absolute: 0 10*3/uL (ref 0.0–0.1)
Basophils Relative: 1 %
Eosinophils Absolute: 0 10*3/uL (ref 0.0–0.5)
Eosinophils Relative: 2 %
HCT: 18.5 % — ABNORMAL LOW (ref 39.0–52.0)
Hemoglobin: 6.1 g/dL — CL (ref 13.0–17.0)
Immature Granulocytes: 7 %
Lymphocytes Relative: 18 %
Lymphs Abs: 0.3 10*3/uL — ABNORMAL LOW (ref 0.7–4.0)
MCH: 28.8 pg (ref 26.0–34.0)
MCHC: 33 g/dL (ref 30.0–36.0)
MCV: 87.3 fL (ref 80.0–100.0)
Monocytes Absolute: 0.1 10*3/uL (ref 0.1–1.0)
Monocytes Relative: 7 %
Neutro Abs: 1.1 10*3/uL — ABNORMAL LOW (ref 1.7–7.7)
Neutrophils Relative %: 65 %
Platelet Count: 25 10*3/uL — ABNORMAL LOW (ref 150–400)
RBC: 2.12 MIL/uL — ABNORMAL LOW (ref 4.22–5.81)
RDW: 17.6 % — ABNORMAL HIGH (ref 11.5–15.5)
WBC Count: 1.8 10*3/uL — ABNORMAL LOW (ref 4.0–10.5)
nRBC: 2.3 % — ABNORMAL HIGH (ref 0.0–0.2)

## 2020-10-13 LAB — CMP (CANCER CENTER ONLY)
ALT: 22 U/L (ref 0–44)
AST: 21 U/L (ref 15–41)
Albumin: 3.2 g/dL — ABNORMAL LOW (ref 3.5–5.0)
Alkaline Phosphatase: 258 U/L — ABNORMAL HIGH (ref 38–126)
Anion gap: 11 (ref 5–15)
BUN: 16 mg/dL (ref 8–23)
CO2: 26 mmol/L (ref 22–32)
Calcium: 8.5 mg/dL — ABNORMAL LOW (ref 8.9–10.3)
Chloride: 104 mmol/L (ref 98–111)
Creatinine: 0.67 mg/dL (ref 0.61–1.24)
GFR, Estimated: >60 mL/min (ref >60)
Glucose, Bld: 121 mg/dL — ABNORMAL HIGH (ref 70–99)
Potassium: 3.4 mmol/L — ABNORMAL LOW (ref 3.5–5.1)
Sodium: 141 mmol/L (ref 135–145)
Total Bilirubin: 0.7 mg/dL (ref 0.3–1.2)
Total Protein: 6.5 g/dL (ref 6.5–8.1)

## 2020-10-13 LAB — SAMPLE TO BLOOD BANK

## 2020-10-13 LAB — PREPARE RBC (CROSSMATCH)

## 2020-10-13 MED ORDER — ACETAMINOPHEN 325 MG PO TABS
650.0000 mg | ORAL_TABLET | Freq: Once | ORAL | Status: AC
  Administered 2020-10-13: 650 mg via ORAL

## 2020-10-13 MED ORDER — SODIUM CHLORIDE 0.9% FLUSH
10.0000 mL | INTRAVENOUS | Status: AC | PRN
  Administered 2020-10-13: 10 mL
  Filled 2020-10-13: qty 10

## 2020-10-13 MED ORDER — SODIUM CHLORIDE 0.9% IV SOLUTION
250.0000 mL | Freq: Once | INTRAVENOUS | Status: AC
  Administered 2020-10-13: 250 mL via INTRAVENOUS
  Filled 2020-10-13: qty 250

## 2020-10-13 MED ORDER — DIPHENHYDRAMINE HCL 25 MG PO CAPS
ORAL_CAPSULE | ORAL | Status: AC
  Filled 2020-10-13: qty 1

## 2020-10-13 MED ORDER — DIPHENHYDRAMINE HCL 25 MG PO CAPS
25.0000 mg | ORAL_CAPSULE | Freq: Once | ORAL | Status: AC
  Administered 2020-10-13: 25 mg via ORAL

## 2020-10-13 MED ORDER — ACETAMINOPHEN 325 MG PO TABS
ORAL_TABLET | ORAL | Status: AC
  Filled 2020-10-13: qty 2

## 2020-10-13 MED ORDER — HEPARIN SOD (PORK) LOCK FLUSH 100 UNIT/ML IV SOLN
500.0000 [IU] | Freq: Every day | INTRAVENOUS | Status: AC | PRN
  Administered 2020-10-13: 500 [IU]
  Filled 2020-10-13: qty 5

## 2020-10-13 NOTE — Telephone Encounter (Signed)
Received critical lab result from [facility] lab of Hgb 6.1.  Reported to Dr Alen Blew & he ordered 2 units blood today.

## 2020-10-13 NOTE — Progress Notes (Signed)
Pt stable at time of d/c, VS stable, spouse driving him home, tolerated transfusions well.

## 2020-10-13 NOTE — Patient Instructions (Signed)

## 2020-10-14 ENCOUNTER — Telehealth: Payer: Self-pay

## 2020-10-14 LAB — PROSTATE-SPECIFIC AG, SERUM (LABCORP): Prostate Specific Ag, Serum: 758 ng/mL — ABNORMAL HIGH (ref 0.0–4.0)

## 2020-10-14 LAB — BPAM RBC
Blood Product Expiration Date: 202112132359
Blood Product Expiration Date: 202112162359
ISSUE DATE / TIME: 202111291028
ISSUE DATE / TIME: 202111291028
Unit Type and Rh: 1700
Unit Type and Rh: 1700

## 2020-10-14 LAB — TYPE AND SCREEN
ABO/RH(D): B NEG
Antibody Screen: NEGATIVE
Unit division: 0
Unit division: 0

## 2020-10-14 NOTE — Telephone Encounter (Signed)
-----   Message from Wyatt Portela, MD sent at 10/14/2020  1:22 PM EST ----- It will not help. Thanks ----- Message ----- From: Tami Lin, RN Sent: 10/14/2020   1:16 PM EST To: Wyatt Portela, MD  Patient wants to know your opinion on him taking an over the counter iron supplement to help with fatigue. Lanelle Bal

## 2020-10-14 NOTE — Telephone Encounter (Signed)
Patient made aware of Dr. Hazeline Junker response to question below.

## 2020-10-17 ENCOUNTER — Other Ambulatory Visit: Payer: Self-pay | Admitting: Oncology

## 2020-10-17 ENCOUNTER — Telehealth: Payer: Self-pay | Admitting: *Deleted

## 2020-10-17 DIAGNOSIS — C7951 Secondary malignant neoplasm of bone: Secondary | ICD-10-CM

## 2020-10-17 DIAGNOSIS — C61 Malignant neoplasm of prostate: Secondary | ICD-10-CM

## 2020-10-17 MED ORDER — HYDROCODONE-ACETAMINOPHEN 5-325 MG PO TABS: 1.0000 | ORAL_TABLET | Freq: Four times a day (QID) | ORAL | 0 refills | Status: DC | PRN

## 2020-10-17 NOTE — Telephone Encounter (Signed)
Received call from pt requesting refill on his morphine.  He reports have 2 doses left & knows he will need over w/e.  He states he has been taking 3-4/day but has tried to reduce to twice daily.  He removed fentynyl patch today & did not replace b/c he thinks this is effecting his breathing. He has also been taking aleve. He states script needs to go to [facility]. Message to Dr Alen Blew. He would like a return call when sent.

## 2020-10-20 ENCOUNTER — Telehealth: Payer: Self-pay | Admitting: *Deleted

## 2020-10-20 NOTE — Telephone Encounter (Signed)
Patient called confused as to why Hydrocodone was ordered instead of Morphine being reordered.    Per previous note patient advised nurse that he was trying to reduce dose of morphine to twice daily and patient thought Fentanyl was affecting his breathing.   Attempted to reach patient to explain that new medication was due to complaints with fentanyl and attempt to reduce morphine.    Left message pending call back.

## 2020-10-21 ENCOUNTER — Inpatient Hospital Stay: Payer: Medicare HMO | Attending: Oncology

## 2020-10-21 ENCOUNTER — Inpatient Hospital Stay: Payer: Medicare HMO

## 2020-10-21 ENCOUNTER — Other Ambulatory Visit: Payer: Self-pay

## 2020-10-21 ENCOUNTER — Other Ambulatory Visit: Payer: Self-pay | Admitting: *Deleted

## 2020-10-21 VITALS — BP 140/78 | HR 81 | Temp 98.4°F | Resp 18

## 2020-10-21 DIAGNOSIS — Z95828 Presence of other vascular implants and grafts: Secondary | ICD-10-CM

## 2020-10-21 DIAGNOSIS — C61 Malignant neoplasm of prostate: Secondary | ICD-10-CM | POA: Diagnosis present

## 2020-10-21 DIAGNOSIS — D638 Anemia in other chronic diseases classified elsewhere: Secondary | ICD-10-CM

## 2020-10-21 DIAGNOSIS — Z5111 Encounter for antineoplastic chemotherapy: Secondary | ICD-10-CM | POA: Diagnosis not present

## 2020-10-21 DIAGNOSIS — C7951 Secondary malignant neoplasm of bone: Secondary | ICD-10-CM

## 2020-10-21 DIAGNOSIS — Z79899 Other long term (current) drug therapy: Secondary | ICD-10-CM | POA: Insufficient documentation

## 2020-10-21 LAB — CBC WITH DIFFERENTIAL (CANCER CENTER ONLY)
Abs Immature Granulocytes: 0.19 10*3/uL — ABNORMAL HIGH (ref 0.00–0.07)
Basophils Absolute: 0 10*3/uL (ref 0.0–0.1)
Basophils Relative: 1 %
Eosinophils Absolute: 0.1 10*3/uL (ref 0.0–0.5)
Eosinophils Relative: 4 %
HCT: 23.5 % — ABNORMAL LOW (ref 39.0–52.0)
Hemoglobin: 7.8 g/dL — ABNORMAL LOW (ref 13.0–17.0)
Immature Granulocytes: 8 %
Lymphocytes Relative: 22 %
Lymphs Abs: 0.5 10*3/uL — ABNORMAL LOW (ref 0.7–4.0)
MCH: 29.5 pg (ref 26.0–34.0)
MCHC: 33.2 g/dL (ref 30.0–36.0)
MCV: 89 fL (ref 80.0–100.0)
Monocytes Absolute: 0.2 10*3/uL (ref 0.1–1.0)
Monocytes Relative: 7 %
Neutro Abs: 1.3 10*3/uL — ABNORMAL LOW (ref 1.7–7.7)
Neutrophils Relative %: 58 %
Platelet Count: 25 10*3/uL — ABNORMAL LOW (ref 150–400)
RBC: 2.64 MIL/uL — ABNORMAL LOW (ref 4.22–5.81)
RDW: 17 % — ABNORMAL HIGH (ref 11.5–15.5)
WBC Count: 2.3 10*3/uL — ABNORMAL LOW (ref 4.0–10.5)
nRBC: 3.5 % — ABNORMAL HIGH (ref 0.0–0.2)

## 2020-10-21 LAB — CMP (CANCER CENTER ONLY)
ALT: 23 U/L (ref 0–44)
AST: 24 U/L (ref 15–41)
Albumin: 3.3 g/dL — ABNORMAL LOW (ref 3.5–5.0)
Alkaline Phosphatase: 263 U/L — ABNORMAL HIGH (ref 38–126)
Anion gap: 8 (ref 5–15)
BUN: 15 mg/dL (ref 8–23)
CO2: 29 mmol/L (ref 22–32)
Calcium: 8.9 mg/dL (ref 8.9–10.3)
Chloride: 107 mmol/L (ref 98–111)
Creatinine: 0.73 mg/dL (ref 0.61–1.24)
GFR, Estimated: >60 mL/min (ref >60)
Glucose, Bld: 103 mg/dL — ABNORMAL HIGH (ref 70–99)
Potassium: 3.7 mmol/L (ref 3.5–5.1)
Sodium: 144 mmol/L (ref 135–145)
Total Bilirubin: 0.6 mg/dL (ref 0.3–1.2)
Total Protein: 6.4 g/dL — ABNORMAL LOW (ref 6.5–8.1)

## 2020-10-21 LAB — SAMPLE TO BLOOD BANK

## 2020-10-21 LAB — PREPARE RBC (CROSSMATCH)

## 2020-10-21 MED ORDER — SODIUM CHLORIDE 0.9% FLUSH
10.0000 mL | INTRAVENOUS | Status: AC | PRN
  Administered 2020-10-21: 10 mL
  Filled 2020-10-21: qty 10

## 2020-10-21 MED ORDER — SODIUM CHLORIDE 0.9% IV SOLUTION
250.0000 mL | Freq: Once | INTRAVENOUS | Status: AC
  Administered 2020-10-21: 250 mL via INTRAVENOUS
  Filled 2020-10-21: qty 250

## 2020-10-21 MED ORDER — ACETAMINOPHEN 325 MG PO TABS
650.0000 mg | ORAL_TABLET | Freq: Once | ORAL | Status: AC
  Administered 2020-10-21: 650 mg via ORAL

## 2020-10-21 MED ORDER — DIPHENHYDRAMINE HCL 25 MG PO CAPS
25.0000 mg | ORAL_CAPSULE | Freq: Once | ORAL | Status: AC
  Administered 2020-10-21: 25 mg via ORAL

## 2020-10-21 MED ORDER — HEPARIN SOD (PORK) LOCK FLUSH 100 UNIT/ML IV SOLN
500.0000 [IU] | Freq: Once | INTRAVENOUS | Status: DC | PRN
  Filled 2020-10-21: qty 5

## 2020-10-21 MED ORDER — DIPHENHYDRAMINE HCL 25 MG PO CAPS
ORAL_CAPSULE | ORAL | Status: AC
  Filled 2020-10-21: qty 1

## 2020-10-21 MED ORDER — ACETAMINOPHEN 325 MG PO TABS
ORAL_TABLET | ORAL | Status: AC
  Filled 2020-10-21: qty 2

## 2020-10-21 MED ORDER — SODIUM CHLORIDE 0.9% FLUSH
10.0000 mL | INTRAVENOUS | Status: DC | PRN
  Filled 2020-10-21: qty 10

## 2020-10-21 MED ORDER — HEPARIN SOD (PORK) LOCK FLUSH 100 UNIT/ML IV SOLN
500.0000 [IU] | Freq: Every day | INTRAVENOUS | Status: AC | PRN
  Administered 2020-10-21: 500 [IU]
  Filled 2020-10-21: qty 5

## 2020-10-21 NOTE — Patient Instructions (Signed)

## 2020-10-21 NOTE — Telephone Encounter (Signed)
Oral Oncology Patient Advocate Encounter  Met patient in [place] to complete a re-enrollmrnt application for [employer] [facility] [place] ([employer]) in an effort to reduce the patient's out of pocket expense for Tafinlar to $0.    Application completed and faxed to [phone].   [employer] phone number for follow up is [phone].   This encounter will be updated until final determination.    [place] [facility] [place] Phone [phone] Fax [phone] 10/21/2020 3:53 PM

## 2020-10-22 LAB — TYPE AND SCREEN
ABO/RH(D): B NEG
Antibody Screen: NEGATIVE
Unit division: 0
Unit division: 0

## 2020-10-22 LAB — BPAM RBC
Blood Product Expiration Date: 202112282359
Blood Product Expiration Date: 202112282359
ISSUE DATE / TIME: 202112071136
ISSUE DATE / TIME: 202112071136
Unit Type and Rh: 1700
Unit Type and Rh: 1700

## 2020-10-22 LAB — PROSTATE-SPECIFIC AG, SERUM (LABCORP): Prostate Specific Ag, Serum: 769 ng/mL — ABNORMAL HIGH (ref 0.0–4.0)

## 2020-10-23 ENCOUNTER — Telehealth: Payer: Self-pay

## 2020-10-23 NOTE — Telephone Encounter (Signed)
Called patient and let him know Dr. Hazeline Junker response to his question below.

## 2020-10-23 NOTE — Telephone Encounter (Signed)
-----   Message from Wyatt Portela, MD sent at 10/23/2020  9:21 AM EST ----- Ok to tell him that. It's not surprising it's up as he is not receiving cancer treatment but little change overall. Thanks ----- Message ----- From: Tami Lin, RN Sent: 10/23/2020   9:18 AM EST To: Wyatt Portela, MD  Patient called inquiring about recent PSA result. Do you want me to tell me anything other than it is up slightly.  Lanelle Bal

## 2020-11-03 ENCOUNTER — Other Ambulatory Visit: Payer: Self-pay | Admitting: Oncology

## 2020-11-03 ENCOUNTER — Telehealth: Payer: Self-pay | Admitting: *Deleted

## 2020-11-03 DIAGNOSIS — C7951 Secondary malignant neoplasm of bone: Secondary | ICD-10-CM

## 2020-11-03 DIAGNOSIS — C61 Malignant neoplasm of prostate: Secondary | ICD-10-CM

## 2020-11-03 MED ORDER — FENTANYL 25 MCG/HR TD PT72: 2.0000 | MEDICATED_PATCH | TRANSDERMAL | 0 refills | Status: DC

## 2020-11-03 NOTE — Telephone Encounter (Signed)
Patient called to report that he thought he would be able to get off of the Fentanyl and could manage his pain with other medication that was prescribed.  He states he unfortunately can not tolerate the pain level and feels that he will have to stay on the fentanyl.  He is asking for a refill of Fentanyl to be processed to [facility] on [address].  Routed to Dr Alen Blew to review.

## 2020-11-04 ENCOUNTER — Inpatient Hospital Stay: Payer: Medicare HMO

## 2020-11-04 ENCOUNTER — Other Ambulatory Visit: Payer: Self-pay

## 2020-11-04 ENCOUNTER — Telehealth: Payer: Self-pay

## 2020-11-04 VITALS — BP 117/56 | HR 74 | Temp 98.1°F | Resp 18

## 2020-11-04 DIAGNOSIS — Z5111 Encounter for antineoplastic chemotherapy: Secondary | ICD-10-CM | POA: Diagnosis not present

## 2020-11-04 DIAGNOSIS — C7951 Secondary malignant neoplasm of bone: Secondary | ICD-10-CM

## 2020-11-04 DIAGNOSIS — D638 Anemia in other chronic diseases classified elsewhere: Secondary | ICD-10-CM

## 2020-11-04 DIAGNOSIS — Z79899 Other long term (current) drug therapy: Secondary | ICD-10-CM | POA: Diagnosis not present

## 2020-11-04 DIAGNOSIS — C61 Malignant neoplasm of prostate: Secondary | ICD-10-CM

## 2020-11-04 DIAGNOSIS — Z95828 Presence of other vascular implants and grafts: Secondary | ICD-10-CM

## 2020-11-04 LAB — CBC WITH DIFFERENTIAL (CANCER CENTER ONLY)
Abs Immature Granulocytes: 0.22 10*3/uL — ABNORMAL HIGH (ref 0.00–0.07)
Basophils Absolute: 0 10*3/uL (ref 0.0–0.1)
Basophils Relative: 0 %
Eosinophils Absolute: 0.1 10*3/uL (ref 0.0–0.5)
Eosinophils Relative: 3 %
HCT: 20.6 % — ABNORMAL LOW (ref 39.0–52.0)
Hemoglobin: 6.9 g/dL — CL (ref 13.0–17.0)
Immature Granulocytes: 10 %
Lymphocytes Relative: 13 %
Lymphs Abs: 0.3 10*3/uL — ABNORMAL LOW (ref 0.7–4.0)
MCH: 30.1 pg (ref 26.0–34.0)
MCHC: 33.5 g/dL (ref 30.0–36.0)
MCV: 90 fL (ref 80.0–100.0)
Monocytes Absolute: 0.1 10*3/uL (ref 0.1–1.0)
Monocytes Relative: 5 %
Neutro Abs: 1.6 10*3/uL — ABNORMAL LOW (ref 1.7–7.7)
Neutrophils Relative %: 69 %
Platelet Count: 28 10*3/uL — ABNORMAL LOW (ref 150–400)
RBC: 2.29 MIL/uL — ABNORMAL LOW (ref 4.22–5.81)
RDW: 16.7 % — ABNORMAL HIGH (ref 11.5–15.5)
WBC Count: 2.3 10*3/uL — ABNORMAL LOW (ref 4.0–10.5)
nRBC: 2.2 % — ABNORMAL HIGH (ref 0.0–0.2)

## 2020-11-04 LAB — CMP (CANCER CENTER ONLY)
ALT: 25 U/L (ref 0–44)
AST: 24 U/L (ref 15–41)
Albumin: 3.4 g/dL — ABNORMAL LOW (ref 3.5–5.0)
Alkaline Phosphatase: 270 U/L — ABNORMAL HIGH (ref 38–126)
Anion gap: 10 (ref 5–15)
BUN: 15 mg/dL (ref 8–23)
CO2: 26 mmol/L (ref 22–32)
Calcium: 8.7 mg/dL — ABNORMAL LOW (ref 8.9–10.3)
Chloride: 103 mmol/L (ref 98–111)
Creatinine: 0.69 mg/dL (ref 0.61–1.24)
GFR, Estimated: >60 mL/min (ref >60)
Glucose, Bld: 133 mg/dL — ABNORMAL HIGH (ref 70–99)
Potassium: 3.9 mmol/L (ref 3.5–5.1)
Sodium: 139 mmol/L (ref 135–145)
Total Bilirubin: 0.4 mg/dL (ref 0.3–1.2)
Total Protein: 6.5 g/dL (ref 6.5–8.1)

## 2020-11-04 LAB — PREPARE RBC (CROSSMATCH)

## 2020-11-04 LAB — SAMPLE TO BLOOD BANK

## 2020-11-04 MED ORDER — SODIUM CHLORIDE 0.9% FLUSH
10.0000 mL | INTRAVENOUS | Status: AC | PRN
  Administered 2020-11-04: 10 mL
  Filled 2020-11-04: qty 10

## 2020-11-04 MED ORDER — ACETAMINOPHEN 325 MG PO TABS
ORAL_TABLET | ORAL | Status: AC
  Filled 2020-11-04: qty 2

## 2020-11-04 MED ORDER — HEPARIN SOD (PORK) LOCK FLUSH 100 UNIT/ML IV SOLN
500.0000 [IU] | Freq: Every day | INTRAVENOUS | Status: AC | PRN
  Administered 2020-11-04: 500 [IU]
  Filled 2020-11-04: qty 5

## 2020-11-04 MED ORDER — DIPHENHYDRAMINE HCL 25 MG PO CAPS
25.0000 mg | ORAL_CAPSULE | Freq: Once | ORAL | Status: AC
  Administered 2020-11-04: 25 mg via ORAL

## 2020-11-04 MED ORDER — SODIUM CHLORIDE 0.9% FLUSH
10.0000 mL | INTRAVENOUS | Status: DC | PRN
  Administered 2020-11-04: 10 mL
  Filled 2020-11-04: qty 10

## 2020-11-04 MED ORDER — DIPHENHYDRAMINE HCL 25 MG PO CAPS
ORAL_CAPSULE | ORAL | Status: AC
  Filled 2020-11-04: qty 1

## 2020-11-04 MED ORDER — ACETAMINOPHEN 325 MG PO TABS
650.0000 mg | ORAL_TABLET | Freq: Once | ORAL | Status: AC
  Administered 2020-11-04: 650 mg via ORAL

## 2020-11-04 MED ORDER — SODIUM CHLORIDE 0.9% IV SOLUTION
250.0000 mL | Freq: Once | INTRAVENOUS | Status: AC
  Administered 2020-11-04: 250 mL via INTRAVENOUS
  Filled 2020-11-04: qty 250

## 2020-11-04 NOTE — Telephone Encounter (Signed)
CRITICAL VALUE STICKER  CRITICAL VALUE: HGB 6.9  RECEIVER (on-site recipient of call): Lenox Ponds LPN  [place] NOTIFIED: 11/04/20 1156  MESSENGER (representative from lab): Jhonnie Garner lab  MD NOTIFIED: Dr. Alen Blew   TIME OF NOTIFICATION:11:58  RESPONSE: Pt. To receive RBC transfusion today 2 units.

## 2020-11-04 NOTE — Patient Instructions (Signed)

## 2020-11-05 LAB — TYPE AND SCREEN
ABO/RH(D): B NEG
Antibody Screen: NEGATIVE
Unit division: 0
Unit division: 0

## 2020-11-05 LAB — BPAM RBC
Blood Product Expiration Date: 202201202359
Blood Product Expiration Date: 202201222359
ISSUE DATE / TIME: 202112211321
ISSUE DATE / TIME: 202112211321
Unit Type and Rh: 1700
Unit Type and Rh: 1700

## 2020-11-14 ENCOUNTER — Other Ambulatory Visit: Payer: Self-pay

## 2020-11-14 ENCOUNTER — Emergency Department (HOSPITAL_BASED_OUTPATIENT_CLINIC_OR_DEPARTMENT_OTHER)
Admission: EM | Admit: 2020-11-14 | Discharge: 2020-11-14 | Disposition: A | Discharge status: Home / Self Care | Payer: Medicare HMO | Attending: Emergency Medicine | Admitting: Emergency Medicine

## 2020-11-14 ENCOUNTER — Emergency Department (HOSPITAL_BASED_OUTPATIENT_CLINIC_OR_DEPARTMENT_OTHER): Payer: Medicare HMO

## 2020-11-14 ENCOUNTER — Encounter (HOSPITAL_BASED_OUTPATIENT_CLINIC_OR_DEPARTMENT_OTHER): Payer: Self-pay | Admitting: *Deleted

## 2020-11-14 DIAGNOSIS — F17210 Nicotine dependence, cigarettes, uncomplicated: Secondary | ICD-10-CM | POA: Insufficient documentation

## 2020-11-14 DIAGNOSIS — R69 Illness, unspecified: Secondary | ICD-10-CM | POA: Diagnosis not present

## 2020-11-14 DIAGNOSIS — S2231XA Fracture of one rib, right side, initial encounter for closed fracture: Secondary | ICD-10-CM | POA: Diagnosis not present

## 2020-11-14 DIAGNOSIS — R0602 Shortness of breath: Secondary | ICD-10-CM | POA: Diagnosis not present

## 2020-11-14 DIAGNOSIS — Z85830 Personal history of malignant neoplasm of bone: Secondary | ICD-10-CM | POA: Diagnosis not present

## 2020-11-14 DIAGNOSIS — I10 Essential (primary) hypertension: Secondary | ICD-10-CM | POA: Diagnosis not present

## 2020-11-14 DIAGNOSIS — Z8546 Personal history of malignant neoplasm of prostate: Secondary | ICD-10-CM | POA: Diagnosis not present

## 2020-11-14 DIAGNOSIS — D649 Anemia, unspecified: Secondary | ICD-10-CM | POA: Diagnosis not present

## 2020-11-14 LAB — CBC
HCT: 24.9 % — ABNORMAL LOW (ref 39.0–52.0)
Hemoglobin: 8.3 g/dL — ABNORMAL LOW (ref 13.0–17.0)
MCH: 30.1 pg (ref 26.0–34.0)
MCHC: 33.3 g/dL (ref 30.0–36.0)
MCV: 90.2 fL (ref 80.0–100.0)
Platelets: 32 10*3/uL — ABNORMAL LOW (ref 150–400)
RBC: 2.76 MIL/uL — ABNORMAL LOW (ref 4.22–5.81)
RDW: 16.8 % — ABNORMAL HIGH (ref 11.5–15.5)
WBC: 2.6 10*3/uL — ABNORMAL LOW (ref 4.0–10.5)
nRBC: 1.9 % — ABNORMAL HIGH (ref 0.0–0.2)

## 2020-11-14 LAB — BASIC METABOLIC PANEL
Anion gap: 10 (ref 5–15)
BUN: 16 mg/dL (ref 8–23)
CO2: 27 mmol/L (ref 22–32)
Calcium: 8.6 mg/dL — ABNORMAL LOW (ref 8.9–10.3)
Chloride: 100 mmol/L (ref 98–111)
Creatinine, Ser: 0.59 mg/dL — ABNORMAL LOW (ref 0.61–1.24)
GFR, Estimated: >60 mL/min (ref >60)
Glucose, Bld: 114 mg/dL — ABNORMAL HIGH (ref 70–99)
Potassium: 4.1 mmol/L (ref 3.5–5.1)
Sodium: 137 mmol/L (ref 135–145)

## 2020-11-14 NOTE — Discharge Instructions (Addendum)
Call your primary care doctor or specialist as discussed in the next 2-3 days.   Return immediately back to the ER if:  Your symptoms worsen within the next 12-24 hours. You develop new symptoms such as new fevers, persistent vomiting, new pain, shortness of breath, or new weakness or numbness, or if you have any other concerns.  

## 2020-11-14 NOTE — ED Notes (Signed)
Pt on monitor 

## 2020-11-14 NOTE — ED Provider Notes (Signed)
[facility] EMERGENCY DEPARTMENT Provider Note   CSN: [id number] Arrival date & time: 11/14/20  1711     History Chief Complaint  Patient presents with  . Shortness of Breath    Gregory Schneider is a 64 y.o. male.  Patient presents chief complaint shortness of breath.  He has a history of chronic intermittent shortness of breath and chronic need for blood transfusions due to low hemoglobin.  Has history of metastatic prostate cancer infiltrating his bone marrow requiring frequent transfusions.  Patient is due for transfusion of 4 days however felt his symptoms were a little bit more significant than usual and presented to the ER for evaluation due to concern that his hemoglobin may be too low.  Denies any bleeding that is noticed no bloody stools no bloody vomitus.  No fever no cough no vomiting or diarrhea no chest pain abdominal pains.        Past Medical History:  Diagnosis Date  . Arthritis   . BPH (benign prostatic hyperplasia)   . Cancer (HCC)   . Depression   . Hypertension   . Pneumonia    hx of  . Prostate cancer (HCC) 03/03/2012    Patient Active Problem List   Diagnosis Date Noted  . Port-A-Cath in place 02/06/2020  . Goals of care, counseling/discussion 08/31/2019  . Primary malignant neoplasm of prostate metastatic to bone (HCC) 05/30/2014  . Fatigue 05/30/2014  . Hypertension 05/30/2014  . Thrombocytopenia, unspecified (HCC) 05/30/2014  . Hypokalemia 05/28/2014  . Prostate cancer (HCC) 03/03/2012  . BPH (benign prostatic hyperplasia)     Past Surgical History:  Procedure Laterality Date  . HYDROCELE EXCISION Left 12/25/2012   Procedure: HYDROCELECTOMY ADULT;  Surgeon: Crecencio Mc, MD;  Location: [facility];  Service: Urology;  Laterality: Left;  LEFT HYDROCELE REPAIR, BILATERAL SIMPLE ORCHIECTOMY   . IR IMAGING GUIDED PORT INSERTION  09/10/2019  . ORCHIECTOMY Bilateral 12/25/2012   Procedure: ORCHIECTOMY;  Surgeon: Crecencio Mc, MD;  Location: [facility];   Service: Urology;  Laterality: Bilateral;       Family History  Problem Relation Age of Onset  . Prostate cancer Paternal Grandfather 24  . Stomach cancer Paternal Grandfather   . Cancer Paternal Aunt 55       breast    Social History   Tobacco Use  . Smoking status: Current Some Day Smoker    Packs/day: 1.50    Years: 20.00    Pack years: 30.00    Types: Cigarettes    Last attempt to quit: 03/03/1992    Years since quitting: 28.7  . Smokeless tobacco: Current User    Types: Snuff  Vaping Use  . Vaping Use: Never used  Substance Use Topics  . Alcohol use: Yes    Comment: 2 a day  . Drug use: Yes    Types: Marijuana    Comment: last night    Home Medications Prior to Admission medications   Medication Sig Start Date End Date Taking? Authorizing Provider  amLODipine (NORVASC) 2.5 MG tablet Take 2.5 mg by mouth daily.    [provider]  buPROPion (ZYBAN) 150 MG 12 hr tablet Take 150 mg by mouth.    [provider]  calcium carbonate (OS-CAL) 600 MG TABS Take 600 mg by mouth daily.    [provider]  cholecalciferol (VITAMIN D) 1000 UNITS tablet Take 1,000 Units by mouth daily.    [provider]  clonazePAM (KLONOPIN) 1 MG tablet Take 1 mg by  mouth as needed for anxiety (1-2 tablets daily as needed).     [provider]  Cyanocobalamin (RA VITAMIN B-12 TR) 1000 MCG TBCR Take by mouth.    [provider]  dabrafenib mesylate (TAFINLAR) 75 MG capsule Take 2 capsules (150 mg total) by mouth 2 (two) times daily. Take on an empty stomach 1 hour before or 2 hours after meals. 06/05/20   Benjiman Core, MD  fentaNYL (DURAGESIC) 25 MCG/HR Place 2 patches onto the skin every other day. 11/03/20   Benjiman Core, MD  gabapentin (NEURONTIN) 300 MG capsule TAKE 1 CAPSULE AT BEDTIME 11/03/20   Benjiman Core, MD  HYDROcodone-acetaminophen (NORCO/VICODIN) 5-325 MG tablet Take 1-2 tablets by mouth every 6 (six) hours as needed.  10/17/20   Benjiman Core, MD  lidocaine-prilocaine (EMLA) cream Apply 1 application topically as needed. 08/31/19   Benjiman Core, MD  losartan (COZAAR) 100 MG tablet Take by mouth. 07/24/20   [provider]  morphine (MSIR) 15 MG tablet Take 1 tablet (15 mg total) by mouth every 4 (four) hours as needed for severe pain. 09/23/20   Benjiman Core, MD  Multiple Vitamin (MULTIVITAMIN) tablet Take 1 tablet by mouth daily.    [provider]  naproxen sodium (ALEVE) 220 MG tablet Take by mouth.    [provider]  OXYGEN Inhale 4-5 L into the lungs.    [provider]  prochlorperazine (COMPAZINE) 10 MG tablet Take 1 tablet (10 mg total) by mouth every 6 (six) hours as needed for nausea or vomiting. 08/31/19   Benjiman Core, MD  sertraline (ZOLOFT) 100 MG tablet Take 200 mg by mouth every morning.     [provider]  trametinib dimethyl sulfoxide (MEKINIST) 2 MG tablet Take 1 tablet (2 mg total) by mouth daily. Take 1 hour before or 2 hours after a meal. Store refrigerated in original container. 06/05/20   Benjiman Core, MD  traZODone (DESYREL) 50 MG tablet TAKE 1 TABLET BY MOUTH EVERY DAY AT NIGHT 03/10/20   [provider]    Allergies    Penicillins  Review of Systems   Review of Systems  Constitutional: Negative for fever.  HENT: Negative for ear pain and sore throat.   Eyes: Negative for pain.  Respiratory: Positive for shortness of breath. Negative for cough.   Cardiovascular: Negative for chest pain.  Gastrointestinal: Negative for abdominal pain.  Genitourinary: Negative for flank pain.  Musculoskeletal: Negative for back pain.  Skin: Negative for color change and rash.  Neurological: Negative for syncope.  All other systems reviewed and are negative.   Physical Exam Updated Vital Signs BP (!) 120/52   Pulse 78   Temp 98.9 F (37.2 C) (Oral)   Resp 17   Ht 6\' 3"  (1.905 m)   Wt 136.1 kg   SpO2 95%   BMI 37.50 kg/m    Physical Exam Constitutional:      General: He is not in acute distress.    Appearance: He is well-developed.  HENT:     Head: Normocephalic.     Nose: Nose normal.  Eyes:     Extraocular Movements: Extraocular movements intact.  Cardiovascular:     Rate and Rhythm: Normal rate.  Pulmonary:     Effort: Pulmonary effort is normal.  Skin:    Coloration: Skin is not jaundiced.  Neurological:     Mental Status: He is alert. Mental status is at baseline.  ED Results / Procedures / Treatments   Labs (all labs ordered are listed, but only abnormal results are displayed) Labs Reviewed  BASIC METABOLIC PANEL - Abnormal; Notable for the following components:      Result Value   Glucose, Bld 114 (*)    Creatinine, Ser 0.59 (*)    Calcium 8.6 (*)    All other components within normal limits  CBC - Abnormal; Notable for the following components:   WBC 2.6 (*)    RBC 2.76 (*)    Hemoglobin 8.3 (*)    HCT 24.9 (*)    RDW 16.8 (*)    Platelets 32 (*)    nRBC 1.9 (*)    All other components within normal limits    EKG None  Radiology DG Chest Port 1 View  Result Date: 11/14/2020 CLINICAL DATA:  Shortness of breath. EXAM: PORTABLE CHEST 1 VIEW COMPARISON:  Chest CT 08/11/2020. FINDINGS: Right chest port in place. Upper normal heart size. Normal mediastinal contours. Right midlung opacity is at site of nodule on prior CT. There is peribronchial thickening. No confluent consolidation. No pleural fluid or pneumothorax. Remote right rib fractures. Patient's known sclerotic osseous metastatic disease is better appreciated on CT. IMPRESSION: 1. Upper normal heart size peribronchial thickening, may be related to smoking or bronchitis. 2. Right midlung opacity at site of nodule on prior CT, nonspecific radiographic appearance. Electronically Signed   By: Keith Rake M.D.   On: 11/14/2020 21:48    Procedures Procedures (including critical care time)  Medications Ordered in  ED Medications - No data to display  ED Course  I have reviewed the triage vital signs and the nursing notes.  Pertinent labs & imaging results that were available during my care of the patient were reviewed by me and considered in my medical decision making (see chart for details).    MDM Rules/Calculators/A&P                          Labs unremarkable white count to hemoglobin 8.3 platelet count of 32.  These are similar to prior values he had recently.  Chemistry remarkable as well.  Patient was transfused just within the last 2 weeks when he had a hemoglobin of 6.2 he states he was given 2 units.  His blood hemoglobin level is now 8.3 consistent with history of having been given 2 units.  He does have a appointment for blood transfusion in 4 days which advised him to keep.  Advised immediate return if he has worsening symptoms fevers cough or any additional concerns.  Chest x-ray today is otherwise unremarkable per radiologist.   Final Clinical Impression(s) / ED Diagnoses Final diagnoses:  Anemia, unspecified type    Rx / DC Orders ED Discharge Orders    None       Luna Fuse, MD 11/14/20 2203

## 2020-11-14 NOTE — ED Triage Notes (Signed)
Pt reports he is due for a blood transfusion on Tuesday. Today he states he has has muscle tremors and is SOB (which happens when his Hgb is low.

## 2020-11-17 ENCOUNTER — Telehealth: Payer: Self-pay | Admitting: *Deleted

## 2020-11-17 ENCOUNTER — Other Ambulatory Visit: Payer: Self-pay | Admitting: *Deleted

## 2020-11-17 DIAGNOSIS — C7951 Secondary malignant neoplasm of bone: Secondary | ICD-10-CM

## 2020-11-17 DIAGNOSIS — C61 Malignant neoplasm of prostate: Secondary | ICD-10-CM

## 2020-11-17 MED ORDER — HYDROCODONE-ACETAMINOPHEN 5-325 MG PO TABS: 1.0000 | ORAL_TABLET | Freq: Four times a day (QID) | ORAL | 0 refills | Status: DC | PRN

## 2020-11-17 NOTE — Telephone Encounter (Signed)
Patient called - requested refill of hydrocodone.

## 2020-11-17 NOTE — Telephone Encounter (Signed)
Patient thinks he may need to pay for and get only 10 tablets of new pain med RX (new RX for 90 tablets sent in by Dr. Clelia Croft 1/3). He said he thinks insurance won't cover a new prescription until 1/6. If that's the case, he will need a prescription for 80 tablets on 1/6.  He will call office to update after he talks with pharmacy.

## 2020-11-18 ENCOUNTER — Other Ambulatory Visit: Payer: Self-pay

## 2020-11-18 ENCOUNTER — Inpatient Hospital Stay: Payer: Medicare HMO

## 2020-11-18 ENCOUNTER — Inpatient Hospital Stay (HOSPITAL_BASED_OUTPATIENT_CLINIC_OR_DEPARTMENT_OTHER): Payer: Medicare HMO | Admitting: Oncology

## 2020-11-18 ENCOUNTER — Inpatient Hospital Stay: Payer: Medicare HMO | Attending: Oncology

## 2020-11-18 VITALS — BP 99/59 | HR 88 | Temp 98.9°F | Resp 17 | Ht 75.0 in | Wt 298.9 lb

## 2020-11-18 VITALS — BP 128/77 | HR 75 | Temp 97.9°F | Resp 17

## 2020-11-18 DIAGNOSIS — D649 Anemia, unspecified: Secondary | ICD-10-CM | POA: Insufficient documentation

## 2020-11-18 DIAGNOSIS — C61 Malignant neoplasm of prostate: Secondary | ICD-10-CM | POA: Diagnosis not present

## 2020-11-18 DIAGNOSIS — C7951 Secondary malignant neoplasm of bone: Secondary | ICD-10-CM

## 2020-11-18 DIAGNOSIS — Z95828 Presence of other vascular implants and grafts: Secondary | ICD-10-CM

## 2020-11-18 DIAGNOSIS — G893 Neoplasm related pain (acute) (chronic): Secondary | ICD-10-CM | POA: Insufficient documentation

## 2020-11-18 DIAGNOSIS — D638 Anemia in other chronic diseases classified elsewhere: Secondary | ICD-10-CM

## 2020-11-18 DIAGNOSIS — Z79899 Other long term (current) drug therapy: Secondary | ICD-10-CM | POA: Insufficient documentation

## 2020-11-18 DIAGNOSIS — Z9221 Personal history of antineoplastic chemotherapy: Secondary | ICD-10-CM | POA: Diagnosis not present

## 2020-11-18 DIAGNOSIS — Z923 Personal history of irradiation: Secondary | ICD-10-CM | POA: Diagnosis not present

## 2020-11-18 LAB — PREPARE RBC (CROSSMATCH)

## 2020-11-18 LAB — SAMPLE TO BLOOD BANK

## 2020-11-18 LAB — CBC WITH DIFFERENTIAL (CANCER CENTER ONLY)
Abs Immature Granulocytes: 0.18 10*3/uL — ABNORMAL HIGH (ref 0.00–0.07)
Basophils Absolute: 0 10*3/uL (ref 0.0–0.1)
Basophils Relative: 0 %
Eosinophils Absolute: 0.1 10*3/uL (ref 0.0–0.5)
Eosinophils Relative: 3 %
HCT: 21.6 % — ABNORMAL LOW (ref 39.0–52.0)
Hemoglobin: 7.3 g/dL — ABNORMAL LOW (ref 13.0–17.0)
Immature Granulocytes: 7 %
Lymphocytes Relative: 23 %
Lymphs Abs: 0.6 10*3/uL — ABNORMAL LOW (ref 0.7–4.0)
MCH: 30.2 pg (ref 26.0–34.0)
MCHC: 33.8 g/dL (ref 30.0–36.0)
MCV: 89.3 fL (ref 80.0–100.0)
Monocytes Absolute: 0.2 10*3/uL (ref 0.1–1.0)
Monocytes Relative: 9 %
Neutro Abs: 1.4 10*3/uL — ABNORMAL LOW (ref 1.7–7.7)
Neutrophils Relative %: 58 %
Platelet Count: 30 10*3/uL — ABNORMAL LOW (ref 150–400)
RBC: 2.42 MIL/uL — ABNORMAL LOW (ref 4.22–5.81)
RDW: 16.7 % — ABNORMAL HIGH (ref 11.5–15.5)
WBC Count: 2.4 10*3/uL — ABNORMAL LOW (ref 4.0–10.5)
nRBC: 2.5 % — ABNORMAL HIGH (ref 0.0–0.2)

## 2020-11-18 LAB — CMP (CANCER CENTER ONLY)
ALT: 23 U/L (ref 0–44)
AST: 19 U/L (ref 15–41)
Albumin: 3.3 g/dL — ABNORMAL LOW (ref 3.5–5.0)
Alkaline Phosphatase: 228 U/L — ABNORMAL HIGH (ref 38–126)
Anion gap: 9 (ref 5–15)
BUN: 12 mg/dL (ref 8–23)
CO2: 27 mmol/L (ref 22–32)
Calcium: 8.4 mg/dL — ABNORMAL LOW (ref 8.9–10.3)
Chloride: 103 mmol/L (ref 98–111)
Creatinine: 0.67 mg/dL (ref 0.61–1.24)
GFR, Estimated: >60 mL/min (ref >60)
Glucose, Bld: 121 mg/dL — ABNORMAL HIGH (ref 70–99)
Potassium: 3.5 mmol/L (ref 3.5–5.1)
Sodium: 139 mmol/L (ref 135–145)
Total Bilirubin: 0.5 mg/dL (ref 0.3–1.2)
Total Protein: 6.4 g/dL — ABNORMAL LOW (ref 6.5–8.1)

## 2020-11-18 MED ORDER — DIPHENHYDRAMINE HCL 25 MG PO CAPS
ORAL_CAPSULE | ORAL | Status: AC
  Filled 2020-11-18: qty 1

## 2020-11-18 MED ORDER — SODIUM CHLORIDE 0.9% FLUSH
3.0000 mL | INTRAVENOUS | Status: DC | PRN
  Filled 2020-11-18: qty 10

## 2020-11-18 MED ORDER — ACETAMINOPHEN 325 MG PO TABS
650.0000 mg | ORAL_TABLET | Freq: Once | ORAL | Status: AC
  Administered 2020-11-18: 650 mg via ORAL

## 2020-11-18 MED ORDER — SODIUM CHLORIDE 0.9% FLUSH
10.0000 mL | INTRAVENOUS | Status: DC | PRN
  Administered 2020-11-18: 10 mL
  Filled 2020-11-18: qty 10

## 2020-11-18 MED ORDER — ACETAMINOPHEN 325 MG PO TABS
ORAL_TABLET | ORAL | Status: AC
  Filled 2020-11-18: qty 2

## 2020-11-18 MED ORDER — HEPARIN SOD (PORK) LOCK FLUSH 100 UNIT/ML IV SOLN
250.0000 [IU] | INTRAVENOUS | Status: DC | PRN
  Filled 2020-11-18: qty 5

## 2020-11-18 MED ORDER — SODIUM CHLORIDE 0.9% IV SOLUTION
250.0000 mL | Freq: Once | INTRAVENOUS | Status: AC
  Administered 2020-11-18: 250 mL via INTRAVENOUS
  Filled 2020-11-18: qty 250

## 2020-11-18 MED ORDER — HEPARIN SOD (PORK) LOCK FLUSH 100 UNIT/ML IV SOLN
500.0000 [IU] | Freq: Once | INTRAVENOUS | Status: AC | PRN
  Administered 2020-11-18: 500 [IU]
  Filled 2020-11-18: qty 5

## 2020-11-18 MED ORDER — DIPHENHYDRAMINE HCL 25 MG PO CAPS
25.0000 mg | ORAL_CAPSULE | Freq: Once | ORAL | Status: AC
  Administered 2020-11-18: 25 mg via ORAL

## 2020-11-18 NOTE — Patient Instructions (Signed)

## 2020-11-18 NOTE — Progress Notes (Signed)
Hematology and Oncology Follow Up Visit  Gregory Schneider [id number] 27-Dec-1955 65 y.o. 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  He has castration-resistant since 2013.  Prior Therapy:  Combined androgen deprivation with Lupron and Casodex. He had a good response and then developed a rise in the PSA with castrate level testosterone.  He is S/P Bilateral simple orchiectomy done on 12/25/2012.  Provenge started on 03/03/12. Therapy Ended in 03/31/2012. Case Zytiga started in 10/2013. Therapy discontinued in January 2018 because of progression of disease. Xofigo infusion to start on Apr 13, 2018.  He completed 6 months of therapy in November 2019. Status post prostate radiation between December 19, 2018 and January 01, 2019.  He received total of 30 Gy in 10 fractions to the prostate and the pubic rami. Xtandi 160 mg daily started in February 2018.  Therapy discontinued in October 2020 because of progression of disease.  Taxotere chemotherapy started on September 13, 2019. He completed 9 cycles of therapy in May 2021.   Current therapy: Supportive care only.      Interim History:  Gregory Schneider is here for repeat evaluation.  Since the last visit, he was seen in the emergency department on December 31 with complaints of shortness of breath.  At that time his hemoglobin was 8.3 no other acute symptoms.  Since that visit, he reports his breathing has been stable and uses oxygen on a consistent basis.  He denies any nausea vomiting or bleeding complications.  He does report some fatigue and tiredness.     Medications: Updated on review. Current Outpatient Medications  Medication Sig Dispense Refill  . amLODipine (NORVASC) 2.5 MG tablet Take 2.5 mg by mouth daily.    Marland Kitchen buPROPion (ZYBAN) 150 MG 12 hr tablet Take 150 mg by mouth.    . calcium carbonate (OS-CAL) 600 MG TABS Take 600 mg by  mouth daily.    . cholecalciferol (VITAMIN D) 1000 UNITS tablet Take 1,000 Units by mouth daily.    . clonazePAM (KLONOPIN) 1 MG tablet Take 1 mg by mouth as needed for anxiety (1-2 tablets daily as needed).     . Cyanocobalamin (RA VITAMIN B-12 TR) 1000 MCG TBCR Take by mouth.    . dabrafenib mesylate (TAFINLAR) 75 MG capsule Take 2 capsules (150 mg total) by mouth 2 (two) times daily. Take on an empty stomach 1 hour before or 2 hours after meals. 120 capsule 0  . fentaNYL (DURAGESIC) 25 MCG/HR Place 2 patches onto the skin every other day. 30 patch 0  . gabapentin (NEURONTIN) 300 MG capsule TAKE 1 CAPSULE AT BEDTIME 90 capsule 3  . HYDROcodone-acetaminophen (NORCO/VICODIN) 5-325 MG tablet Take 1-2 tablets by mouth every 6 (six) hours as needed. 90 tablet 0  . lidocaine-prilocaine (EMLA) cream Apply 1 application topically as needed. 30 g 0  . losartan (COZAAR) 100 MG tablet Take by mouth.    . morphine (MSIR) 15 MG tablet Take 1 tablet (15 mg total) by mouth every 4 (four) hours as needed for severe pain. 60 tablet 0  . Multiple Vitamin (MULTIVITAMIN) tablet Take 1 tablet by mouth daily.    . naproxen sodium (ALEVE) 220 MG tablet Take by mouth.    . OXYGEN Inhale 4-5 L into the lungs.    . prochlorperazine (COMPAZINE) 10 MG tablet Take 1 tablet (10 mg total) by mouth every 6 (six) hours as needed for nausea  or vomiting. 30 tablet 3  . sertraline (ZOLOFT) 100 MG tablet Take 200 mg by mouth every morning.     . trametinib dimethyl sulfoxide (MEKINIST) 2 MG tablet Take 1 tablet (2 mg total) by mouth daily. Take 1 hour before or 2 hours after a meal. Store refrigerated in original container. 30 tablet 0  . traZODone (DESYREL) 50 MG tablet TAKE 1 TABLET BY MOUTH EVERY DAY AT NIGHT     No current facility-administered medications for this visit.   Facility-Administered Medications Ordered in Other Visits  Medication Dose Route Frequency Provider Last Rate Last Admin  . sodium chloride flush (NS)  0.9 % injection 10 mL  10 mL Intracatheter PRN Wyatt Portela, MD   10 mL at 07/25/20 1554    Allergies:  Allergies  Allergen Reactions  . Penicillins Other (See Comments)    Patient stated,"I don't remember what kind of reaction because I was a kid."      Physical Exam:        Blood pressure (!) 99/59, pulse 88, temperature 98.9 F (37.2 C), temperature source Tympanic, resp. rate 17, height 6\' 3"  (1.905 m), weight 298 lb 14.4 oz (135.6 kg), SpO2 95 %.      ECOG: 2    General appearance: Alert, awake without any distress. Head: Atraumatic without abnormalities Oropharynx: Without any thrush or ulcers. Eyes: No scleral icterus. Lymph nodes: No lymphadenopathy noted in the cervical, supraclavicular, or axillary nodes Heart:regular rate and rhythm, without any murmurs or gallops.   Lung: Clear to auscultation without any rhonchi, wheezes or dullness to percussion. Abdomin: Soft, nontender without any shifting dullness or ascites. Musculoskeletal: No clubbing or cyanosis. Neurological: No motor or sensory deficits. Skin: No rashes or lesions.        .                 Lab Results: Lab Results  Component Value Date   WBC 2.6 (L) 11/14/2020   HGB 8.3 (L) 11/14/2020   HCT 24.9 (L) 11/14/2020   MCV 90.2 11/14/2020   PLT 32 (L) 11/14/2020     Chemistry      Component Value Date/Time   NA 137 11/14/2020 1954   NA 136 11/17/2017 0956   K 4.1 11/14/2020 1954   K 3.8 11/17/2017 0956   CL 100 11/14/2020 1954   CL 104 03/09/2013 0921   CO2 27 11/14/2020 1954   CO2 28 11/17/2017 0956   BUN 16 11/14/2020 1954   BUN 19.7 11/17/2017 0956   CREATININE 0.59 (L) 11/14/2020 1954   CREATININE 0.69 11/04/2020 1115   CREATININE 0.9 11/17/2017 0956      Component Value Date/Time   CALCIUM 8.6 (L) 11/14/2020 1954   CALCIUM 9.5 11/17/2017 0956   ALKPHOS 270 (H) 11/04/2020 1115   ALKPHOS 89 11/17/2017 0956   AST 24 11/04/2020 1115   AST 21  11/17/2017 0956   ALT 25 11/04/2020 1115   ALT 47 11/17/2017 0956   BILITOT 0.4 11/04/2020 1115   BILITOT 0.39 11/17/2017 0956           Results for Gregory Schneider (MRN [id number]) as of 11/18/2020 08:16  Ref. Range 09/23/2020 08:16 10/13/2020 08:24 10/21/2020 10:17  Prostate Specific Ag, Serum Latest Ref Range: 0.0 - 4.0 ng/mL 685.0 (H) 758.0 (H) 769.0 (H)          Impression and Plan:    65 year old man with  1.  Advanced prostate cancer approaching end-stage status with bone marrow  involvement.  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  The natural course of this disease was reviewed again today and treatment options were discussed.  Continues to experience further decline and not a candidate for any additional therapy and he has exhausted all other treatments.  A trial of dexamethasone could also be a possibility to alleviate some of his symptoms and palliate his disease.  I recommended supportive care and hospice enrollment at this time.  He has opted against steroids for the time being and will continue to monitor his PSA and repeat imaging studies next month to update his staging.    2.  Anemia: Related to bone marrow infiltration of his cancer.  He is currently on supportive transfusion.  His hemoglobin today 7.3 and will receive 2 units of packed red cells.    3.  Prognosis and goals of care: Treatment is palliative and his prognosis is poor with limited life expectancy.   4  Bone pain: Currently on fentanyl and hydrocodone.    5. Follow up: We will continue to follow-up every 2 weeks for packed red cell transfusion and MD follow-up in 8 weeks.  30 minutes were dedicated to this encounter.  Time was spent on reviewing his disease status, reviewing laboratory data, treatment options and overall prognosis discussion.  Zola Button MD 1/4/20228:13 AM

## 2020-11-19 LAB — TYPE AND SCREEN
ABO/RH(D): B NEG
Antibody Screen: NEGATIVE
Unit division: 0
Unit division: 0

## 2020-11-19 LAB — BPAM RBC
Blood Product Expiration Date: 202201262359
Blood Product Expiration Date: 202201272359
ISSUE DATE / TIME: 202201041059
ISSUE DATE / TIME: 202201041059
Unit Type and Rh: 1700
Unit Type and Rh: 1700

## 2020-11-20 ENCOUNTER — Telehealth: Payer: Self-pay | Admitting: Oncology

## 2020-11-20 NOTE — Telephone Encounter (Signed)
Scheduled 01/04 los, patient has been called and notified.

## 2020-11-24 ENCOUNTER — Other Ambulatory Visit: Payer: Self-pay | Admitting: Oncology

## 2020-11-25 ENCOUNTER — Other Ambulatory Visit: Payer: Self-pay | Admitting: Oncology

## 2020-11-25 DIAGNOSIS — C61 Malignant neoplasm of prostate: Secondary | ICD-10-CM

## 2020-11-25 DIAGNOSIS — C7951 Secondary malignant neoplasm of bone: Secondary | ICD-10-CM

## 2020-11-25 MED ORDER — HYDROCODONE-ACETAMINOPHEN 5-325 MG PO TABS: 1.0000 | ORAL_TABLET | Freq: Four times a day (QID) | ORAL | 0 refills | Status: AC | PRN

## 2020-12-03 ENCOUNTER — Inpatient Hospital Stay: Payer: Medicare HMO

## 2020-12-03 ENCOUNTER — Other Ambulatory Visit: Payer: Self-pay

## 2020-12-03 ENCOUNTER — Other Ambulatory Visit: Payer: Self-pay | Admitting: Oncology

## 2020-12-03 VITALS — BP 127/75 | HR 72 | Temp 97.9°F | Resp 18

## 2020-12-03 DIAGNOSIS — C61 Malignant neoplasm of prostate: Secondary | ICD-10-CM

## 2020-12-03 DIAGNOSIS — C7951 Secondary malignant neoplasm of bone: Secondary | ICD-10-CM | POA: Diagnosis not present

## 2020-12-03 DIAGNOSIS — Z95828 Presence of other vascular implants and grafts: Secondary | ICD-10-CM

## 2020-12-03 DIAGNOSIS — Z9221 Personal history of antineoplastic chemotherapy: Secondary | ICD-10-CM | POA: Diagnosis not present

## 2020-12-03 DIAGNOSIS — Z79899 Other long term (current) drug therapy: Secondary | ICD-10-CM | POA: Diagnosis not present

## 2020-12-03 DIAGNOSIS — Z923 Personal history of irradiation: Secondary | ICD-10-CM | POA: Diagnosis not present

## 2020-12-03 DIAGNOSIS — D649 Anemia, unspecified: Secondary | ICD-10-CM | POA: Diagnosis not present

## 2020-12-03 DIAGNOSIS — G893 Neoplasm related pain (acute) (chronic): Secondary | ICD-10-CM | POA: Diagnosis not present

## 2020-12-03 LAB — CMP (CANCER CENTER ONLY)
ALT: 26 U/L (ref 0–44)
AST: 23 U/L (ref 15–41)
Albumin: 3.4 g/dL — ABNORMAL LOW (ref 3.5–5.0)
Alkaline Phosphatase: 202 U/L — ABNORMAL HIGH (ref 38–126)
Anion gap: 13 (ref 5–15)
BUN: 13 mg/dL (ref 8–23)
CO2: 26 mmol/L (ref 22–32)
Calcium: 8.6 mg/dL — ABNORMAL LOW (ref 8.9–10.3)
Chloride: 102 mmol/L (ref 98–111)
Creatinine: 0.7 mg/dL (ref 0.61–1.24)
GFR, Estimated: >60 mL/min (ref >60)
Glucose, Bld: 157 mg/dL — ABNORMAL HIGH (ref 70–99)
Potassium: 3.3 mmol/L — ABNORMAL LOW (ref 3.5–5.1)
Sodium: 141 mmol/L (ref 135–145)
Total Bilirubin: 0.5 mg/dL (ref 0.3–1.2)
Total Protein: 6.6 g/dL (ref 6.5–8.1)

## 2020-12-03 LAB — CBC WITH DIFFERENTIAL (CANCER CENTER ONLY)
Abs Immature Granulocytes: 0.23 10*3/uL — ABNORMAL HIGH (ref 0.00–0.07)
Basophils Absolute: 0 10*3/uL (ref 0.0–0.1)
Basophils Relative: 0 %
Eosinophils Absolute: 0.1 10*3/uL (ref 0.0–0.5)
Eosinophils Relative: 4 %
HCT: 22 % — ABNORMAL LOW (ref 39.0–52.0)
Hemoglobin: 7.4 g/dL — ABNORMAL LOW (ref 13.0–17.0)
Immature Granulocytes: 9 %
Lymphocytes Relative: 18 %
Lymphs Abs: 0.5 10*3/uL — ABNORMAL LOW (ref 0.7–4.0)
MCH: 30 pg (ref 26.0–34.0)
MCHC: 33.6 g/dL (ref 30.0–36.0)
MCV: 89.1 fL (ref 80.0–100.0)
Monocytes Absolute: 0.2 10*3/uL (ref 0.1–1.0)
Monocytes Relative: 6 %
Neutro Abs: 1.6 10*3/uL — ABNORMAL LOW (ref 1.7–7.7)
Neutrophils Relative %: 63 %
Platelet Count: 34 10*3/uL — ABNORMAL LOW (ref 150–400)
RBC: 2.47 MIL/uL — ABNORMAL LOW (ref 4.22–5.81)
RDW: 17.2 % — ABNORMAL HIGH (ref 11.5–15.5)
WBC Count: 2.5 10*3/uL — ABNORMAL LOW (ref 4.0–10.5)
nRBC: 1.2 % — ABNORMAL HIGH (ref 0.0–0.2)

## 2020-12-03 LAB — SAMPLE TO BLOOD BANK

## 2020-12-03 LAB — PREPARE RBC (CROSSMATCH)

## 2020-12-03 MED ORDER — FENTANYL 25 MCG/HR TD PT72: 2.0000 | MEDICATED_PATCH | TRANSDERMAL | 0 refills | Status: DC

## 2020-12-03 MED ORDER — SODIUM CHLORIDE 0.9% FLUSH
10.0000 mL | INTRAVENOUS | Status: DC | PRN
  Administered 2020-12-03: 10 mL
  Filled 2020-12-03: qty 10

## 2020-12-03 MED ORDER — DIPHENHYDRAMINE HCL 25 MG PO CAPS
25.0000 mg | ORAL_CAPSULE | Freq: Once | ORAL | Status: AC
  Administered 2020-12-03: 25 mg via ORAL

## 2020-12-03 MED ORDER — ACETAMINOPHEN 325 MG PO TABS
650.0000 mg | ORAL_TABLET | Freq: Once | ORAL | Status: AC
  Administered 2020-12-03: 650 mg via ORAL

## 2020-12-03 MED ORDER — DIPHENHYDRAMINE HCL 25 MG PO CAPS
ORAL_CAPSULE | ORAL | Status: AC
  Filled 2020-12-03: qty 1

## 2020-12-03 MED ORDER — ACETAMINOPHEN 325 MG PO TABS
ORAL_TABLET | ORAL | Status: AC
  Filled 2020-12-03: qty 2

## 2020-12-03 MED ORDER — SODIUM CHLORIDE 0.9% IV SOLUTION
250.0000 mL | Freq: Once | INTRAVENOUS | Status: AC
  Administered 2020-12-03: 250 mL via INTRAVENOUS
  Filled 2020-12-03: qty 250

## 2020-12-03 MED ORDER — HEPARIN SOD (PORK) LOCK FLUSH 100 UNIT/ML IV SOLN
500.0000 [IU] | Freq: Once | INTRAVENOUS | Status: AC | PRN
  Administered 2020-12-03: 500 [IU]
  Filled 2020-12-03: qty 5

## 2020-12-03 NOTE — Patient Instructions (Signed)

## 2020-12-04 LAB — TYPE AND SCREEN
ABO/RH(D): B NEG
Antibody Screen: NEGATIVE
Unit division: 0
Unit division: 0

## 2020-12-04 LAB — BPAM RBC
Blood Product Expiration Date: 202202112359
Blood Product Expiration Date: 202202132359
ISSUE DATE / TIME: 202201191216
ISSUE DATE / TIME: 202201191216
Unit Type and Rh: 1700
Unit Type and Rh: 1700

## 2020-12-04 LAB — PROSTATE-SPECIFIC AG, SERUM (LABCORP): Prostate Specific Ag, Serum: 747 ng/mL — ABNORMAL HIGH (ref 0.0–4.0)

## 2020-12-15 ENCOUNTER — Telehealth: Payer: Self-pay

## 2020-12-15 ENCOUNTER — Emergency Department (HOSPITAL_COMMUNITY): Payer: Medicare HMO

## 2020-12-15 ENCOUNTER — Other Ambulatory Visit: Payer: Self-pay

## 2020-12-15 ENCOUNTER — Emergency Department (HOSPITAL_COMMUNITY)
Admission: EM | Admit: 2020-12-15 | Discharge: 2020-12-15 | Disposition: A | Discharge status: Home / Self Care | Payer: Medicare HMO | Attending: Emergency Medicine | Admitting: Emergency Medicine

## 2020-12-15 ENCOUNTER — Encounter (HOSPITAL_COMMUNITY): Payer: Self-pay

## 2020-12-15 DIAGNOSIS — R61 Generalized hyperhidrosis: Secondary | ICD-10-CM | POA: Diagnosis not present

## 2020-12-15 DIAGNOSIS — F419 Anxiety disorder, unspecified: Secondary | ICD-10-CM | POA: Diagnosis not present

## 2020-12-15 DIAGNOSIS — R0602 Shortness of breath: Secondary | ICD-10-CM | POA: Insufficient documentation

## 2020-12-15 DIAGNOSIS — I10 Essential (primary) hypertension: Secondary | ICD-10-CM | POA: Insufficient documentation

## 2020-12-15 DIAGNOSIS — F17210 Nicotine dependence, cigarettes, uncomplicated: Secondary | ICD-10-CM | POA: Diagnosis not present

## 2020-12-15 DIAGNOSIS — Z8546 Personal history of malignant neoplasm of prostate: Secondary | ICD-10-CM | POA: Insufficient documentation

## 2020-12-15 DIAGNOSIS — R69 Illness, unspecified: Secondary | ICD-10-CM | POA: Diagnosis not present

## 2020-12-15 DIAGNOSIS — Z79899 Other long term (current) drug therapy: Secondary | ICD-10-CM | POA: Insufficient documentation

## 2020-12-15 DIAGNOSIS — G8929 Other chronic pain: Secondary | ICD-10-CM | POA: Diagnosis not present

## 2020-12-15 DIAGNOSIS — J9811 Atelectasis: Secondary | ICD-10-CM | POA: Diagnosis not present

## 2020-12-15 DIAGNOSIS — M549 Dorsalgia, unspecified: Secondary | ICD-10-CM

## 2020-12-15 DIAGNOSIS — M546 Pain in thoracic spine: Secondary | ICD-10-CM | POA: Diagnosis not present

## 2020-12-15 DIAGNOSIS — J90 Pleural effusion, not elsewhere classified: Secondary | ICD-10-CM | POA: Diagnosis not present

## 2020-12-15 DIAGNOSIS — R0789 Other chest pain: Secondary | ICD-10-CM | POA: Diagnosis not present

## 2020-12-15 DIAGNOSIS — R079 Chest pain, unspecified: Secondary | ICD-10-CM | POA: Diagnosis not present

## 2020-12-15 LAB — COMPREHENSIVE METABOLIC PANEL
ALT: 22 U/L (ref 0–44)
AST: 26 U/L (ref 15–41)
Albumin: 3.8 g/dL (ref 3.5–5.0)
Alkaline Phosphatase: 189 U/L — ABNORMAL HIGH (ref 38–126)
Anion gap: 12 (ref 5–15)
BUN: 14 mg/dL (ref 8–23)
CO2: 26 mmol/L (ref 22–32)
Calcium: 8.6 mg/dL — ABNORMAL LOW (ref 8.9–10.3)
Chloride: 101 mmol/L (ref 98–111)
Creatinine, Ser: 0.57 mg/dL — ABNORMAL LOW (ref 0.61–1.24)
GFR, Estimated: >60 mL/min (ref >60)
Glucose, Bld: 123 mg/dL — ABNORMAL HIGH (ref 70–99)
Potassium: 3.7 mmol/L (ref 3.5–5.1)
Sodium: 139 mmol/L (ref 135–145)
Total Bilirubin: 0.9 mg/dL (ref 0.3–1.2)
Total Protein: 7.1 g/dL (ref 6.5–8.1)

## 2020-12-15 LAB — CBC WITH DIFFERENTIAL/PLATELET
Abs Immature Granulocytes: 0.51 10*3/uL — ABNORMAL HIGH (ref 0.00–0.07)
Basophils Absolute: 0 10*3/uL (ref 0.0–0.1)
Basophils Relative: 0 %
Eosinophils Absolute: 0.1 10*3/uL (ref 0.0–0.5)
Eosinophils Relative: 2 %
HCT: 26.3 % — ABNORMAL LOW (ref 39.0–52.0)
Hemoglobin: 8.6 g/dL — ABNORMAL LOW (ref 13.0–17.0)
Immature Granulocytes: 11 %
Lymphocytes Relative: 15 %
Lymphs Abs: 0.7 10*3/uL (ref 0.7–4.0)
MCH: 29.2 pg (ref 26.0–34.0)
MCHC: 32.7 g/dL (ref 30.0–36.0)
MCV: 89.2 fL (ref 80.0–100.0)
Monocytes Absolute: 0.4 10*3/uL (ref 0.1–1.0)
Monocytes Relative: 9 %
Neutro Abs: 2.8 10*3/uL (ref 1.7–7.7)
Neutrophils Relative %: 63 %
Platelets: 34 10*3/uL — ABNORMAL LOW (ref 150–400)
RBC: 2.95 MIL/uL — ABNORMAL LOW (ref 4.22–5.81)
RDW: 16.6 % — ABNORMAL HIGH (ref 11.5–15.5)
WBC: 4.5 10*3/uL (ref 4.0–10.5)
nRBC: 0.9 % — ABNORMAL HIGH (ref 0.0–0.2)

## 2020-12-15 LAB — TROPONIN I (HIGH SENSITIVITY)
Troponin I (High Sensitivity): 4 ng/L (ref <18)
Troponin I (High Sensitivity): 4 ng/L (ref <18)

## 2020-12-15 LAB — BRAIN NATRIURETIC PEPTIDE: B Natriuretic Peptide: 16.3 pg/mL (ref 0.0–100.0)

## 2020-12-15 MED ORDER — KETOROLAC TROMETHAMINE 30 MG/ML IJ SOLN
15.0000 mg | Freq: Once | INTRAMUSCULAR | Status: AC
  Administered 2020-12-15: 15 mg via INTRAVENOUS
  Filled 2020-12-15: qty 1

## 2020-12-15 MED ORDER — MORPHINE SULFATE (PF) 4 MG/ML IV SOLN
4.0000 mg | Freq: Once | INTRAVENOUS | Status: AC
Start: 2020-12-15 — End: 2020-12-15
  Administered 2020-12-15: 4 mg via INTRAVENOUS
  Filled 2020-12-15: qty 1

## 2020-12-15 MED ORDER — HEPARIN SOD (PORK) LOCK FLUSH 100 UNIT/ML IV SOLN
500.0000 [IU] | Freq: Once | INTRAVENOUS | Status: AC
  Administered 2020-12-15: 500 [IU]
  Filled 2020-12-15: qty 5

## 2020-12-15 MED ORDER — LORAZEPAM 2 MG/ML IJ SOLN
1.0000 mg | Freq: Once | INTRAMUSCULAR | Status: AC
  Administered 2020-12-15: 1 mg via INTRAVENOUS
  Filled 2020-12-15: qty 1

## 2020-12-15 MED ORDER — IOHEXOL 350 MG/ML SOLN
100.0000 mL | Freq: Once | INTRAVENOUS | Status: AC | PRN
  Administered 2020-12-15: 100 mL via INTRAVENOUS

## 2020-12-15 MED ORDER — OXYCODONE-ACETAMINOPHEN 5-325 MG PO TABS
2.0000 | ORAL_TABLET | Freq: Once | ORAL | Status: AC
Start: 2020-12-15 — End: 2020-12-15
  Administered 2020-12-15: 2 via ORAL
  Filled 2020-12-15: qty 2

## 2020-12-15 NOTE — ED Triage Notes (Signed)
Pt arrived via walk in, c/o increasing SOB and generalized body pain x3 days. States he gets blood transfusion q 2 wks at cancer center , next transfusion due tomorrow but pt states he was in too much pain to wait at home.

## 2020-12-15 NOTE — ED Provider Notes (Signed)
Patient signed out to me is pending CT imaging.  CT shows no evidence of home embolism.  Consistent with history of metastatic prostate cancer he has multiple metastatic lesions especially in his spine causing his chronic back pain.  Given pain medications here in the ER, recommending outpatient follow-up with his oncologist within the next 2 to 3 days.  Recommend immediate return for worsening symptoms, fevers, worsening pain or any additional concerns.Luna Fuse, MD 12/15/20 Karl Bales

## 2020-12-15 NOTE — Telephone Encounter (Signed)
Pt left message requesting a refill of medication but did not indicate which rx. I have left a message requesting this information.

## 2020-12-15 NOTE — ED Provider Notes (Signed)
[facility] DEPT Provider Note   CSN: [id number] Arrival date & time: 12/15/20  1102     History Chief Complaint  Patient presents with  . Shortness of Breath  . Back Pain    Gregory Schneider is a 65 y.o. male.  65yo M w/ PMH including metastatic prostate CA, HTN, depression/anxiety who p/w shortness of breath and back pain. Pt reports 3 days of progressively worsening shortness of breath, central chest heaviness that is worse w/ coughing, and upper back pain. He is not sure whether back pain is related to cancer metastases. He denies cough or fever. He feels very anxious and feels like he needs something to calm him down. He states he gets blood transfusions every 2 weeks and is due for one tomorrow. He states when he gets close to needing a transfusion, he usually has to use oxygen at home. He takes ibuprofen, hydrocodone for pain. He denies fevers, vomiting, diarrhea, or sick contacts.  The history is provided by the patient.  Shortness of Breath Back Pain      Past Medical History:  Diagnosis Date  . Arthritis   . BPH (benign prostatic hyperplasia)   . Cancer ([place])   . Depression   . Hypertension   . Pneumonia    hx of  . Prostate cancer ([place]) 03/03/2012    Patient Active Problem List   Diagnosis Date Noted  . Port-A-Cath in place 02/06/2020  . Goals of care, counseling/discussion 08/31/2019  . Primary malignant neoplasm of prostate metastatic to bone ([place]) 05/30/2014  . Fatigue 05/30/2014  . Hypertension 05/30/2014  . Thrombocytopenia, unspecified ([place]) 05/30/2014  . Hypokalemia 05/28/2014  . Prostate cancer ([place]) 03/03/2012  . BPH (benign prostatic hyperplasia)     Past Surgical History:  Procedure Laterality Date  . HYDROCELE EXCISION Left 12/25/2012   Procedure: HYDROCELECTOMY ADULT;  Surgeon: Dutch Gray, MD;  Location: [facility];  Service: Urology;  Laterality: Left;  LEFT HYDROCELE REPAIR, BILATERAL SIMPLE ORCHIECTOMY   . IR  IMAGING GUIDED PORT INSERTION  09/10/2019  . ORCHIECTOMY Bilateral 12/25/2012   Procedure: ORCHIECTOMY;  Surgeon: Dutch Gray, MD;  Location: [facility];  Service: Urology;  Laterality: Bilateral;       Family History  Problem Relation Age of Onset  . Prostate cancer Paternal Grandfather 54  . Stomach cancer Paternal Grandfather   . Cancer Paternal Aunt 24       breast    Social History   Tobacco Use  . Smoking status: Current Some Day Smoker    Packs/day: 1.50    Years: 20.00    Pack years: 30.00    Types: Cigarettes    Last attempt to quit: 03/03/1992    Years since quitting: 28.8  . Smokeless tobacco: Current User    Types: Snuff  Vaping Use  . Vaping Use: Never used  Substance Use Topics  . Alcohol use: Yes    Comment: 2 a day  . Drug use: Yes    Types: Marijuana    Comment: last night    Home Medications Prior to Admission medications   Medication Sig Start Date End Date Taking? Authorizing Provider  amLODipine (NORVASC) 2.5 MG tablet Take 2.5 mg by mouth daily.   Yes [provider]  calcium carbonate (OS-CAL) 600 MG TABS Take 600 mg by mouth daily.   Yes [provider]  cholecalciferol (VITAMIN D) 1000 UNITS tablet Take 1,000 Units by mouth daily.   Yes [provider]  clonazePAM (  KLONOPIN) 1 MG tablet Take 1 mg by mouth as needed for anxiety (1-2 tablets daily as needed).    Yes [provider]  Cyanocobalamin 1000 MCG TBCR Take 1,000 mcg by mouth daily.   Yes [provider]  fentaNYL (DURAGESIC) 25 MCG/HR Place 2 patches onto the skin every other day. 12/03/20  Yes Wyatt Portela, MD  gabapentin (NEURONTIN) 300 MG capsule TAKE 1 CAPSULE AT BEDTIME Patient taking differently: Take 900 mg by mouth 2 (two) times daily. 11/03/20  Yes Wyatt Portela, MD  HYDROcodone-acetaminophen (NORCO/VICODIN) 5-325 MG tablet Take 1-2 tablets by mouth every 6 (six) hours as needed. 11/25/20  Yes Wyatt Portela, MD  losartan (COZAAR) 100  MG tablet Take 100 mg by mouth daily. 07/24/20  Yes [provider]  Multiple Vitamin (MULTIVITAMIN) tablet Take 1 tablet by mouth daily.   Yes [provider]  naproxen sodium (ALEVE) 220 MG tablet Take 440 mg by mouth 2 (two) times daily as needed (pain).   Yes [provider]  OXYGEN Inhale 4-5 L into the lungs continuous.   Yes [provider]  sertraline (ZOLOFT) 100 MG tablet Take 200 mg by mouth every morning.    Yes [provider]  traZODone (DESYREL) 50 MG tablet Take 50 mg by mouth at bedtime as needed for sleep. 03/10/20  Yes [provider]  dabrafenib mesylate (TAFINLAR) 75 MG capsule Take 2 capsules (150 mg total) by mouth 2 (two) times daily. Take on an empty stomach 1 hour before or 2 hours after meals. Patient not taking: Reported on 12/15/2020 06/05/20   Wyatt Portela, MD  lidocaine-prilocaine (EMLA) cream Apply 1 application topically as needed. Patient not taking: Reported on 12/15/2020 08/31/19   Wyatt Portela, MD  morphine (MSIR) 15 MG tablet Take 1 tablet (15 mg total) by mouth every 4 (four) hours as needed for severe pain. Patient not taking: Reported on 12/15/2020 09/23/20   Wyatt Portela, MD  prochlorperazine (COMPAZINE) 10 MG tablet Take 1 tablet (10 mg total) by mouth every 6 (six) hours as needed for nausea or vomiting. Patient not taking: Reported on 12/15/2020 08/31/19   Wyatt Portela, MD  trametinib dimethyl sulfoxide (MEKINIST) 2 MG tablet Take 1 tablet (2 mg total) by mouth daily. Take 1 hour before or 2 hours after a meal. Store refrigerated in original container. Patient not taking: Reported on 12/15/2020 06/05/20   Wyatt Portela, MD    Allergies    Penicillins  Review of Systems   Review of Systems  Respiratory: Positive for shortness of breath.   Musculoskeletal: Positive for back pain.  All other systems reviewed and are negative except that which was mentioned in HPI   Physical Exam Updated  Vital Signs BP (!) 157/111   Pulse 98   Temp 98.5 F (36.9 C) (Oral)   Resp 15   SpO2 93%   Physical Exam Vitals and nursing note reviewed.  Constitutional:      Appearance: He is well-developed. He is ill-appearing and diaphoretic. He is not toxic-appearing.     Comments: Awake, alert  HENT:     Head: Normocephalic and atraumatic.  Eyes:     Conjunctiva/sclera: Conjunctivae normal.  Cardiovascular:     Rate and Rhythm: Normal rate and regular rhythm.     Heart sounds: Normal heart sounds. No murmur heard.   Pulmonary:     Effort: Pulmonary effort is normal. No respiratory distress.     Breath  sounds: Normal breath sounds. No wheezing.     Comments: Mildly dyspneic Abdominal:     General: Bowel sounds are normal. There is no distension.     Palpations: Abdomen is soft.     Tenderness: There is no abdominal tenderness.  Musculoskeletal:     Cervical back: Neck supple.     Right lower leg: No edema.     Left lower leg: No edema.  Skin:    General: Skin is warm.     Coloration: Skin is pale.  Neurological:     Mental Status: He is alert and oriented to person, place, and time.     Cranial Nerves: No cranial nerve deficit.     Motor: No abnormal muscle tone.     Deep Tendon Reflexes: Reflexes are normal and symmetric.     Comments: Fluent speech, normal gait  Psychiatric:        Mood and Affect: Mood is anxious.        Thought Content: Thought content normal.        Judgment: Judgment normal.     Comments: Jittery, occasionally jumping around in bed, restless     ED Results / Procedures / Treatments   Labs (all labs ordered are listed, but only abnormal results are displayed) Labs Reviewed  CBC WITH DIFFERENTIAL/PLATELET - Abnormal; Notable for the following components:      Result Value   RBC 2.95 (*)    Hemoglobin 8.6 (*)    HCT 26.3 (*)    RDW 16.6 (*)    Platelets 34 (*)    nRBC 0.9 (*)    Abs Immature Granulocytes 0.51 (*)    All other components  within normal limits  COMPREHENSIVE METABOLIC PANEL - Abnormal; Notable for the following components:   Glucose, Bld 123 (*)    Creatinine, Ser 0.57 (*)    Calcium 8.6 (*)    Alkaline Phosphatase 189 (*)    All other components within normal limits  BRAIN NATRIURETIC PEPTIDE  TROPONIN I (HIGH SENSITIVITY)  TROPONIN I (HIGH SENSITIVITY)    EKG EKG Interpretation  Date/Time:  Monday December 15 2020 13:16:43 EST Ventricular Rate:  93 PR Interval:    QRS Duration: 88 QT Interval:  357 QTC Calculation: 444 R Axis:   67 Text Interpretation: Sinus rhythm Prolonged PR interval No previous ECGs available Confirmed by Theotis Burrow [phone]) on 12/15/2020 1:24:55 PM   Radiology DG Chest 2 View  Result Date: 12/15/2020 CLINICAL DATA:  Worsening shortness of breath for 3 days. Metastatic prostate carcinoma. EXAM: CHEST - 2 VIEW COMPARISON:  11/14/2020 FINDINGS: Heart size remains within normal limits. Right-sided power port is again seen. Ill-defined masslike opacity in the right midlung shows no significant change. Increased opacity seen in the medial left lung base. No evidence of pleural effusion. Diffuse sclerotic bone metastases again noted. IMPRESSION: Increased opacity in medial left lung base, which could be due to pneumonia or atelectasis. Stable mass-like opacity in right midlung. Diffuse sclerotic bone metastases. Electronically Signed   By: Marlaine Hind M.D.   On: 12/15/2020 12:43    Procedures Procedures   Medications Ordered in ED Medications  oxyCODONE-acetaminophen (PERCOCET/ROXICET) 5-325 MG per tablet 2 tablet (2 tablets Oral Given 12/15/20 1406)  LORazepam (ATIVAN) injection 1 mg (1 mg Intravenous Given 12/15/20 1416)    ED Course  I have reviewed the triage vital signs and the nursing notes.  Pertinent labs & imaging results that were available during my care of the patient  were reviewed by me and considered in my medical decision making (see chart for details).     MDM Rules/Calculators/A&P                          PT very anxious and jittery on exam, dyspneic but clear breath sounds and reassuring VS. Gave ativan for his anxiety and percocet for pain. Labs show Hgb 8.6 which is actually better than recent values. Reassuring CMP, normal BNP. CXR w/ LLL opacity, unclear whether infiltrate. I am concerned about the possibility of PE given his cancer history. I have ordered a CTA chest for better evaluation. PT signed out to oncoming provider pending imaging results. Final Clinical Impression(s) / ED Diagnoses Final diagnoses:  None    Rx / DC Orders ED Discharge Orders    None       Kais Monje, Wenda Overland, MD 12/15/20 1528

## 2020-12-15 NOTE — Discharge Instructions (Addendum)
Call your primary care doctor or specialist as discussed in the next 2-3 days.   Return immediately back to the ER if:  Your symptoms worsen within the next 12-24 hours. You develop new symptoms such as new fevers, persistent vomiting, new pain, shortness of breath, or new weakness or numbness, or if you have any other concerns.  

## 2020-12-15 NOTE — ED Notes (Signed)
Pt transported to CT ?

## 2020-12-16 ENCOUNTER — Other Ambulatory Visit: Payer: Self-pay | Admitting: Oncology

## 2020-12-16 ENCOUNTER — Other Ambulatory Visit: Payer: Self-pay | Admitting: *Deleted

## 2020-12-16 ENCOUNTER — Inpatient Hospital Stay: Payer: Medicare HMO

## 2020-12-16 ENCOUNTER — Inpatient Hospital Stay: Payer: Medicare HMO | Attending: Oncology

## 2020-12-16 ENCOUNTER — Other Ambulatory Visit: Payer: Self-pay

## 2020-12-16 VITALS — BP 140/89 | HR 89 | Temp 98.6°F | Resp 17

## 2020-12-16 DIAGNOSIS — C61 Malignant neoplasm of prostate: Secondary | ICD-10-CM | POA: Diagnosis present

## 2020-12-16 DIAGNOSIS — D638 Anemia in other chronic diseases classified elsewhere: Secondary | ICD-10-CM

## 2020-12-16 DIAGNOSIS — Z95828 Presence of other vascular implants and grafts: Secondary | ICD-10-CM

## 2020-12-16 DIAGNOSIS — Z79899 Other long term (current) drug therapy: Secondary | ICD-10-CM | POA: Insufficient documentation

## 2020-12-16 DIAGNOSIS — C7951 Secondary malignant neoplasm of bone: Secondary | ICD-10-CM

## 2020-12-16 LAB — CBC WITH DIFFERENTIAL (CANCER CENTER ONLY)
Abs Immature Granulocytes: 0.39 10*3/uL — ABNORMAL HIGH (ref 0.00–0.07)
Basophils Absolute: 0.1 10*3/uL (ref 0.0–0.1)
Basophils Relative: 1 %
Eosinophils Absolute: 0.1 10*3/uL (ref 0.0–0.5)
Eosinophils Relative: 2 %
HCT: 24.1 % — ABNORMAL LOW (ref 39.0–52.0)
Hemoglobin: 7.9 g/dL — ABNORMAL LOW (ref 13.0–17.0)
Immature Granulocytes: 11 %
Lymphocytes Relative: 15 %
Lymphs Abs: 0.5 10*3/uL — ABNORMAL LOW (ref 0.7–4.0)
MCH: 29.3 pg (ref 26.0–34.0)
MCHC: 32.8 g/dL (ref 30.0–36.0)
MCV: 89.3 fL (ref 80.0–100.0)
Monocytes Absolute: 0.3 10*3/uL (ref 0.1–1.0)
Monocytes Relative: 8 %
Neutro Abs: 2.1 10*3/uL (ref 1.7–7.7)
Neutrophils Relative %: 63 %
Platelet Count: 28 10*3/uL — ABNORMAL LOW (ref 150–400)
RBC: 2.7 MIL/uL — ABNORMAL LOW (ref 4.22–5.81)
RDW: 17.1 % — ABNORMAL HIGH (ref 11.5–15.5)
WBC Count: 3.5 10*3/uL — ABNORMAL LOW (ref 4.0–10.5)
nRBC: 1.7 % — ABNORMAL HIGH (ref 0.0–0.2)

## 2020-12-16 LAB — CMP (CANCER CENTER ONLY)
ALT: 17 U/L (ref 0–44)
AST: 23 U/L (ref 15–41)
Albumin: 3.3 g/dL — ABNORMAL LOW (ref 3.5–5.0)
Alkaline Phosphatase: 189 U/L — ABNORMAL HIGH (ref 38–126)
Anion gap: 9 (ref 5–15)
BUN: 13 mg/dL (ref 8–23)
CO2: 28 mmol/L (ref 22–32)
Calcium: 8.3 mg/dL — ABNORMAL LOW (ref 8.9–10.3)
Chloride: 104 mmol/L (ref 98–111)
Creatinine: 0.63 mg/dL (ref 0.61–1.24)
GFR, Estimated: >60 mL/min (ref >60)
Glucose, Bld: 108 mg/dL — ABNORMAL HIGH (ref 70–99)
Potassium: 3.7 mmol/L (ref 3.5–5.1)
Sodium: 141 mmol/L (ref 135–145)
Total Bilirubin: 0.6 mg/dL (ref 0.3–1.2)
Total Protein: 6.3 g/dL — ABNORMAL LOW (ref 6.5–8.1)

## 2020-12-16 LAB — SAMPLE TO BLOOD BANK

## 2020-12-16 LAB — PREPARE RBC (CROSSMATCH)

## 2020-12-16 MED ORDER — DIPHENHYDRAMINE HCL 25 MG PO CAPS
ORAL_CAPSULE | ORAL | Status: AC
  Filled 2020-12-16: qty 1

## 2020-12-16 MED ORDER — ACETAMINOPHEN 325 MG PO TABS
ORAL_TABLET | ORAL | Status: AC
  Filled 2020-12-16: qty 2

## 2020-12-16 MED ORDER — SODIUM CHLORIDE 0.9% FLUSH
10.0000 mL | Freq: Once | INTRAVENOUS | Status: AC
  Administered 2020-12-16: 3 mL via INTRAVENOUS
  Filled 2020-12-16: qty 10

## 2020-12-16 MED ORDER — DIPHENHYDRAMINE HCL 25 MG PO CAPS
25.0000 mg | ORAL_CAPSULE | Freq: Once | ORAL | Status: AC
  Administered 2020-12-16: 25 mg via ORAL

## 2020-12-16 MED ORDER — HYDROMORPHONE HCL 4 MG PO TABS: 4.0000 mg | ORAL_TABLET | ORAL | 0 refills | Status: AC | PRN

## 2020-12-16 MED ORDER — SODIUM CHLORIDE 0.9% IV SOLUTION
250.0000 mL | Freq: Once | INTRAVENOUS | Status: AC
  Administered 2020-12-16: 250 mL via INTRAVENOUS
  Filled 2020-12-16: qty 250

## 2020-12-16 MED ORDER — FENTANYL 75 MCG/HR TD PT72
1.0000 | MEDICATED_PATCH | TRANSDERMAL | 0 refills | Status: AC
Start: 2020-12-16 — End: ?

## 2020-12-16 MED ORDER — ACETAMINOPHEN 325 MG PO TABS
650.0000 mg | ORAL_TABLET | Freq: Once | ORAL | Status: AC
  Administered 2020-12-16: 650 mg via ORAL

## 2020-12-16 MED ORDER — HEPARIN SOD (PORK) LOCK FLUSH 100 UNIT/ML IV SOLN
500.0000 [IU] | Freq: Once | INTRAVENOUS | Status: AC
  Administered 2020-12-16: 500 [IU] via INTRAVENOUS
  Filled 2020-12-16: qty 5

## 2020-12-16 NOTE — Patient Instructions (Signed)

## 2020-12-16 NOTE — Telephone Encounter (Signed)
Called pt's wife with changes in meds.  She expressed understanding & appreciation.

## 2020-12-16 NOTE — Telephone Encounter (Signed)
Pts wife has called this morning advising the pt was is so much pain yesterday he went to the ER and she states his pain medication "was not touching the pain at all to the point of delirium". She is requesting he be prescribed something stronger.  She indicates pt has appt today for blood transfusion if Dr. Alen Blew needs to see him.

## 2020-12-16 NOTE — Telephone Encounter (Signed)
I changed his pain medication: increase the fentanyl to 75 mcg/hr patch. Added dilaudid as needed.

## 2020-12-17 LAB — TYPE AND SCREEN
ABO/RH(D): B NEG
Antibody Screen: NEGATIVE
Unit division: 0

## 2020-12-17 LAB — BPAM RBC
Blood Product Expiration Date: 202203022359
ISSUE DATE / TIME: 202202011336
Unit Type and Rh: 1700

## 2020-12-26 ENCOUNTER — Telehealth: Payer: Self-pay

## 2020-12-26 DIAGNOSIS — C61 Malignant neoplasm of prostate: Secondary | ICD-10-CM | POA: Diagnosis not present

## 2020-12-26 DIAGNOSIS — C799 Secondary malignant neoplasm of unspecified site: Secondary | ICD-10-CM | POA: Diagnosis not present

## 2020-12-26 DIAGNOSIS — J9610 Chronic respiratory failure, unspecified whether with hypoxia or hypercapnia: Secondary | ICD-10-CM | POA: Diagnosis not present

## 2020-12-26 NOTE — Telephone Encounter (Signed)
Patient called requesting home O2. Patient states he has shortness of breath with exertion and sometimes when needing a blood transfusion. Patient states "I have been using a friend's oxygen tank for awhile". Called and spoke to [place] with [place]. Per Thedore Mins unless patient's O2 sats are less than 88% on room air then patient will need to have documented O2 sat on room air, then O2 sat with exertion, then O2 sat after exertion with O2. Patient's O2 saturation at the last few visit has ranged from 93% to 98% on room air. Patient is scheduled to come in on 2/15 for lab and a blood transfusion. Patient to have O2 sats documented when he comes for his appointment on 2/15. Per Thedore Mins then an order for DME oxygen can be placed in Epic and a call placed to [place] at [phone] to make him aware.

## 2020-12-30 ENCOUNTER — Inpatient Hospital Stay: Payer: Medicare HMO

## 2020-12-30 ENCOUNTER — Other Ambulatory Visit: Payer: Self-pay

## 2020-12-30 ENCOUNTER — Other Ambulatory Visit: Payer: Self-pay | Admitting: Oncology

## 2020-12-30 ENCOUNTER — Telehealth: Payer: Self-pay

## 2020-12-30 VITALS — BP 115/65 | HR 75 | Temp 98.2°F | Resp 20

## 2020-12-30 DIAGNOSIS — R0609 Other forms of dyspnea: Secondary | ICD-10-CM

## 2020-12-30 DIAGNOSIS — Z79899 Other long term (current) drug therapy: Secondary | ICD-10-CM | POA: Diagnosis not present

## 2020-12-30 DIAGNOSIS — D638 Anemia in other chronic diseases classified elsewhere: Secondary | ICD-10-CM

## 2020-12-30 DIAGNOSIS — C7951 Secondary malignant neoplasm of bone: Secondary | ICD-10-CM

## 2020-12-30 DIAGNOSIS — R0600 Dyspnea, unspecified: Secondary | ICD-10-CM

## 2020-12-30 DIAGNOSIS — C61 Malignant neoplasm of prostate: Secondary | ICD-10-CM | POA: Diagnosis not present

## 2020-12-30 LAB — CBC WITH DIFFERENTIAL (CANCER CENTER ONLY)
Abs Immature Granulocytes: 0.35 10*3/uL — ABNORMAL HIGH (ref 0.00–0.07)
Basophils Absolute: 0.1 10*3/uL (ref 0.0–0.1)
Basophils Relative: 2 %
Eosinophils Absolute: 0.1 10*3/uL (ref 0.0–0.5)
Eosinophils Relative: 2 %
HCT: 21.7 % — ABNORMAL LOW (ref 39.0–52.0)
Hemoglobin: 7.3 g/dL — ABNORMAL LOW (ref 13.0–17.0)
Immature Granulocytes: 10 %
Lymphocytes Relative: 14 %
Lymphs Abs: 0.5 10*3/uL — ABNORMAL LOW (ref 0.7–4.0)
MCH: 29.7 pg (ref 26.0–34.0)
MCHC: 33.6 g/dL (ref 30.0–36.0)
MCV: 88.2 fL (ref 80.0–100.0)
Monocytes Absolute: 0.3 10*3/uL (ref 0.1–1.0)
Monocytes Relative: 8 %
Neutro Abs: 2.2 10*3/uL (ref 1.7–7.7)
Neutrophils Relative %: 64 %
Platelet Count: 25 10*3/uL — ABNORMAL LOW (ref 150–400)
RBC: 2.46 MIL/uL — ABNORMAL LOW (ref 4.22–5.81)
RDW: 17.1 % — ABNORMAL HIGH (ref 11.5–15.5)
WBC Count: 3.4 10*3/uL — ABNORMAL LOW (ref 4.0–10.5)
nRBC: 2.9 % — ABNORMAL HIGH (ref 0.0–0.2)

## 2020-12-30 LAB — CMP (CANCER CENTER ONLY)
ALT: 16 U/L (ref 0–44)
AST: 25 U/L (ref 15–41)
Albumin: 3.2 g/dL — ABNORMAL LOW (ref 3.5–5.0)
Alkaline Phosphatase: 166 U/L — ABNORMAL HIGH (ref 38–126)
Anion gap: 10 (ref 5–15)
BUN: 11 mg/dL (ref 8–23)
CO2: 29 mmol/L (ref 22–32)
Calcium: 8.5 mg/dL — ABNORMAL LOW (ref 8.9–10.3)
Chloride: 100 mmol/L (ref 98–111)
Creatinine: 0.67 mg/dL (ref 0.61–1.24)
GFR, Estimated: >60 mL/min (ref >60)
Glucose, Bld: 107 mg/dL — ABNORMAL HIGH (ref 70–99)
Potassium: 3.4 mmol/L — ABNORMAL LOW (ref 3.5–5.1)
Sodium: 139 mmol/L (ref 135–145)
Total Bilirubin: 0.7 mg/dL (ref 0.3–1.2)
Total Protein: 6.4 g/dL — ABNORMAL LOW (ref 6.5–8.1)

## 2020-12-30 LAB — PREPARE RBC (CROSSMATCH)

## 2020-12-30 LAB — SAMPLE TO BLOOD BANK

## 2020-12-30 MED ORDER — HEPARIN SOD (PORK) LOCK FLUSH 100 UNIT/ML IV SOLN
500.0000 [IU] | Freq: Every day | INTRAVENOUS | Status: AC | PRN
  Administered 2020-12-30: 500 [IU]
  Filled 2020-12-30: qty 5

## 2020-12-30 MED ORDER — SODIUM CHLORIDE 0.9% FLUSH
10.0000 mL | INTRAVENOUS | Status: AC | PRN
  Administered 2020-12-30: 10 mL
  Filled 2020-12-30: qty 10

## 2020-12-30 MED ORDER — DIPHENHYDRAMINE HCL 25 MG PO CAPS
ORAL_CAPSULE | ORAL | Status: AC
  Filled 2020-12-30: qty 1

## 2020-12-30 MED ORDER — SODIUM CHLORIDE 0.9% IV SOLUTION
250.0000 mL | Freq: Once | INTRAVENOUS | Status: AC
  Administered 2020-12-30: 250 mL via INTRAVENOUS
  Filled 2020-12-30: qty 250

## 2020-12-30 MED ORDER — ACETAMINOPHEN 325 MG PO TABS
ORAL_TABLET | ORAL | Status: AC
  Filled 2020-12-30: qty 2

## 2020-12-30 MED ORDER — ACETAMINOPHEN 325 MG PO TABS
650.0000 mg | ORAL_TABLET | Freq: Once | ORAL | Status: AC
  Administered 2020-12-30: 650 mg via ORAL

## 2020-12-30 MED ORDER — DIPHENHYDRAMINE HCL 25 MG PO CAPS
25.0000 mg | ORAL_CAPSULE | Freq: Once | ORAL | Status: AC
  Administered 2020-12-30: 25 mg via ORAL

## 2020-12-30 NOTE — Patient Instructions (Signed)

## 2020-12-30 NOTE — Telephone Encounter (Signed)
Spoke with patient informing him that he may have to pay out of pocket for DME oxygen because his assessed and recorded O2 saturations do not meet the insurance requirements of 88% or less. Patient was okay with this and agreed for the order to be placed.  MD notes recommending for supplemental oxygen provided. Per Dr. Alen Blew: orders for home DME oxygen 2 L via nasal canula initiated. Gregory Schneider with [facility] contacted and updated.

## 2020-12-30 NOTE — Progress Notes (Signed)
Sat

## 2020-12-30 NOTE — Addendum Note (Signed)
Addended by: Ardeen Garland on: 12/30/2020 04:31 PM   Modules accepted: Orders

## 2020-12-30 NOTE — Progress Notes (Signed)
During pt's appt before receiving blood transfusion oxygen saturation evaluated:   Room air and sitting: HR 86, oxygen saturation 93%.  Pt was only able to walk 3 minutes, while walking oxygen dropped to 91% on room air, HR max: 112, RR: 24  With 2L oxygen applied via [place], pt was able to walk 3 minutes: oxygen saturation was maintained in the 97%-99% range. HR max: 112, RR: 22

## 2020-12-30 NOTE — Progress Notes (Signed)
Patient continues to report dyspnea at rest with desaturation of his oxygen level on exertion.  Oxygen saturation measurements noted on December 30, 2020 and compared to previous examinations.  His oxygen saturation although at 91% he is still quite dyspneic on exertion.  Given his advanced malignancy as well as chronic transfusion dependence, he would benefit from supplemental oxygen for palliative relief.

## 2020-12-31 LAB — TYPE AND SCREEN
ABO/RH(D): B NEG
Antibody Screen: NEGATIVE
Unit division: 0

## 2020-12-31 LAB — BPAM RBC
Blood Product Expiration Date: 202203222359
ISSUE DATE / TIME: 202202151213
Unit Type and Rh: 1700

## 2020-12-31 LAB — PROSTATE-SPECIFIC AG, SERUM (LABCORP): Prostate Specific Ag, Serum: 1021 ng/mL — ABNORMAL HIGH (ref 0.0–4.0)

## 2021-01-06 ENCOUNTER — Encounter (HOSPITAL_COMMUNITY): Payer: Self-pay

## 2021-01-06 ENCOUNTER — Other Ambulatory Visit: Payer: Self-pay

## 2021-01-06 ENCOUNTER — Ambulatory Visit (HOSPITAL_COMMUNITY)
Admission: RE | Admit: 2021-01-06 | Discharge: 2021-01-06 | Disposition: A | Discharge status: Home / Self Care | Payer: Medicare HMO | Source: Ambulatory Visit | Attending: Oncology | Admitting: Oncology

## 2021-01-06 DIAGNOSIS — I700 Atherosclerosis of aorta: Secondary | ICD-10-CM | POA: Diagnosis not present

## 2021-01-06 DIAGNOSIS — I2510 Atherosclerotic heart disease of native coronary artery without angina pectoris: Secondary | ICD-10-CM | POA: Diagnosis not present

## 2021-01-06 DIAGNOSIS — K7689 Other specified diseases of liver: Secondary | ICD-10-CM | POA: Diagnosis not present

## 2021-01-06 DIAGNOSIS — N3289 Other specified disorders of bladder: Secondary | ICD-10-CM | POA: Diagnosis not present

## 2021-01-06 DIAGNOSIS — K5730 Diverticulosis of large intestine without perforation or abscess without bleeding: Secondary | ICD-10-CM | POA: Diagnosis not present

## 2021-01-06 DIAGNOSIS — N62 Hypertrophy of breast: Secondary | ICD-10-CM | POA: Diagnosis not present

## 2021-01-06 DIAGNOSIS — C61 Malignant neoplasm of prostate: Secondary | ICD-10-CM | POA: Diagnosis not present

## 2021-01-06 MED ORDER — IOHEXOL 300 MG/ML  SOLN
100.0000 mL | Freq: Once | INTRAMUSCULAR | Status: AC | PRN
  Administered 2021-01-06: 100 mL via INTRAVENOUS

## 2021-01-06 MED ORDER — HEPARIN SOD (PORK) LOCK FLUSH 100 UNIT/ML IV SOLN
INTRAVENOUS | Status: AC
  Filled 2021-01-06: qty 5

## 2021-01-06 MED ORDER — HEPARIN SOD (PORK) LOCK FLUSH 100 UNIT/ML IV SOLN
500.0000 [IU] | Freq: Once | INTRAVENOUS | Status: AC
  Administered 2021-01-06: 500 [IU] via INTRAVENOUS

## 2021-01-07 ENCOUNTER — Other Ambulatory Visit: Payer: Self-pay | Admitting: Oncology

## 2021-01-07 ENCOUNTER — Telehealth: Payer: Self-pay

## 2021-01-07 DIAGNOSIS — J90 Pleural effusion, not elsewhere classified: Secondary | ICD-10-CM

## 2021-01-07 DIAGNOSIS — R0609 Other forms of dyspnea: Secondary | ICD-10-CM

## 2021-01-07 DIAGNOSIS — C61 Malignant neoplasm of prostate: Secondary | ICD-10-CM

## 2021-01-07 DIAGNOSIS — R0600 Dyspnea, unspecified: Secondary | ICD-10-CM

## 2021-01-07 NOTE — Progress Notes (Signed)
Results of his CT scan were reviewed personally and discussed with the patient today over the phone.  Is not surprising he has disease progression and he is not currently receiving or eligible for any anticancer treatment.  He does have bilateral pleural effusion which is impacting his quality of life and causing shortness of breath.  We will arrange for thoracentesis by interventional radiology to be done in the near future for symptomatic relief.  He is agreeable to proceed with this procedure and all his questions were answered.

## 2021-01-07 NOTE — Telephone Encounter (Signed)
-----   Message from Wyatt Portela, MD sent at 01/07/2021 10:49 AM EST ----- I don't think it's a transfusion issue. I agree with ED evaluation.  ----- Message ----- From: Tami Lin, RN Sent: 01/07/2021  10:48 AM EST To: Wyatt Portela, MD  Patient called to report that his shortness of breath seems to be getting worse. Patient said he is not lightheaded or dizzy but is sitting in the chair with O2 @ 5 LPM via nasal cannula and  is still huffing and puffing. He feels like he is not getting enough oxygen. He said he does not want to go the ED and wants to see if he can move his lab, MD, blood transfusion appointment from next week to this week. Informed the patient that if he is struggling to breathe then he needs to call EMS or proceed to the nearest ED. Patient said "I'll see if it gets worse".  Lanelle Bal

## 2021-01-07 NOTE — Telephone Encounter (Signed)
Called patient and made him aware that he has been scheduled for a COVID test on Friday 2/25 at 11:25 at [address]. Patient has been scheduled for a Thoracentesis on Monday 2/28 at 2:00 with arrival at 1:45 at [facility].  Patient is aware of all appointment dates, times, and locations and verbalized understanding. Patient is aware to seek emergency care if symptoms worsen.

## 2021-01-07 NOTE — Telephone Encounter (Signed)
Called patient and made him aware of Dr. Hazeline Junker response below. Patient verbalized understanding and stated "If it gets worse, I'll go the the ED."

## 2021-01-08 ENCOUNTER — Other Ambulatory Visit (HOSPITAL_COMMUNITY): Payer: Medicare HMO

## 2021-01-08 NOTE — Telephone Encounter (Signed)
Therapy dc'd

## 2021-01-09 ENCOUNTER — Other Ambulatory Visit (HOSPITAL_COMMUNITY)
Admission: RE | Admit: 2021-01-09 | Discharge: 2021-01-09 | Disposition: A | Discharge status: Home / Self Care | Payer: Medicare HMO | Source: Ambulatory Visit | Attending: Oncology | Admitting: Oncology

## 2021-01-09 DIAGNOSIS — Z01812 Encounter for preprocedural laboratory examination: Secondary | ICD-10-CM | POA: Diagnosis not present

## 2021-01-09 DIAGNOSIS — Z20822 Contact with and (suspected) exposure to covid-19: Secondary | ICD-10-CM | POA: Insufficient documentation

## 2021-01-09 LAB — SARS CORONAVIRUS 2 (TAT 6-24 HRS): SARS Coronavirus 2: NEGATIVE

## 2021-01-10 DIAGNOSIS — J810 Acute pulmonary edema: Secondary | ICD-10-CM | POA: Diagnosis not present

## 2021-01-10 DIAGNOSIS — Z9889 Other specified postprocedural states: Secondary | ICD-10-CM | POA: Diagnosis not present

## 2021-01-10 DIAGNOSIS — Z20822 Contact with and (suspected) exposure to covid-19: Secondary | ICD-10-CM | POA: Diagnosis not present

## 2021-01-10 DIAGNOSIS — E8770 Fluid overload, unspecified: Secondary | ICD-10-CM | POA: Diagnosis not present

## 2021-01-10 DIAGNOSIS — D649 Anemia, unspecified: Secondary | ICD-10-CM | POA: Diagnosis not present

## 2021-01-10 DIAGNOSIS — Z515 Encounter for palliative care: Secondary | ICD-10-CM | POA: Diagnosis not present

## 2021-01-10 DIAGNOSIS — F1290 Cannabis use, unspecified, uncomplicated: Secondary | ICD-10-CM | POA: Diagnosis not present

## 2021-01-10 DIAGNOSIS — J9621 Acute and chronic respiratory failure with hypoxia: Secondary | ICD-10-CM | POA: Diagnosis not present

## 2021-01-10 DIAGNOSIS — M542 Cervicalgia: Secondary | ICD-10-CM | POA: Diagnosis not present

## 2021-01-10 DIAGNOSIS — Z880 Allergy status to penicillin: Secondary | ICD-10-CM | POA: Diagnosis not present

## 2021-01-10 DIAGNOSIS — R000 Tachycardia, unspecified: Secondary | ICD-10-CM | POA: Diagnosis not present

## 2021-01-10 DIAGNOSIS — J189 Pneumonia, unspecified organism: Secondary | ICD-10-CM | POA: Diagnosis not present

## 2021-01-10 DIAGNOSIS — M47812 Spondylosis without myelopathy or radiculopathy, cervical region: Secondary | ICD-10-CM | POA: Diagnosis not present

## 2021-01-10 DIAGNOSIS — E559 Vitamin D deficiency, unspecified: Secondary | ICD-10-CM | POA: Diagnosis not present

## 2021-01-10 DIAGNOSIS — Z66 Do not resuscitate: Secondary | ICD-10-CM | POA: Diagnosis not present

## 2021-01-10 DIAGNOSIS — E538 Deficiency of other specified B group vitamins: Secondary | ICD-10-CM | POA: Diagnosis not present

## 2021-01-10 DIAGNOSIS — R911 Solitary pulmonary nodule: Secondary | ICD-10-CM | POA: Diagnosis not present

## 2021-01-10 DIAGNOSIS — Z923 Personal history of irradiation: Secondary | ICD-10-CM | POA: Diagnosis not present

## 2021-01-10 DIAGNOSIS — J918 Pleural effusion in other conditions classified elsewhere: Secondary | ICD-10-CM | POA: Diagnosis not present

## 2021-01-10 DIAGNOSIS — C7951 Secondary malignant neoplasm of bone: Secondary | ICD-10-CM | POA: Diagnosis not present

## 2021-01-10 DIAGNOSIS — I440 Atrioventricular block, first degree: Secondary | ICD-10-CM | POA: Diagnosis not present

## 2021-01-10 DIAGNOSIS — G893 Neoplasm related pain (acute) (chronic): Secondary | ICD-10-CM | POA: Diagnosis not present

## 2021-01-10 DIAGNOSIS — R918 Other nonspecific abnormal finding of lung field: Secondary | ICD-10-CM | POA: Diagnosis not present

## 2021-01-10 DIAGNOSIS — R0602 Shortness of breath: Secondary | ICD-10-CM | POA: Diagnosis not present

## 2021-01-10 DIAGNOSIS — J90 Pleural effusion, not elsewhere classified: Secondary | ICD-10-CM | POA: Diagnosis not present

## 2021-01-10 DIAGNOSIS — I10 Essential (primary) hypertension: Secondary | ICD-10-CM | POA: Diagnosis not present

## 2021-01-10 DIAGNOSIS — F17210 Nicotine dependence, cigarettes, uncomplicated: Secondary | ICD-10-CM | POA: Diagnosis not present

## 2021-01-10 DIAGNOSIS — R69 Illness, unspecified: Secondary | ICD-10-CM | POA: Diagnosis not present

## 2021-01-10 DIAGNOSIS — C787 Secondary malignant neoplasm of liver and intrahepatic bile duct: Secondary | ICD-10-CM | POA: Diagnosis not present

## 2021-01-10 DIAGNOSIS — F419 Anxiety disorder, unspecified: Secondary | ICD-10-CM | POA: Diagnosis not present

## 2021-01-10 DIAGNOSIS — Z85830 Personal history of malignant neoplasm of bone: Secondary | ICD-10-CM | POA: Diagnosis not present

## 2021-01-10 DIAGNOSIS — R531 Weakness: Secondary | ICD-10-CM | POA: Diagnosis not present

## 2021-01-10 DIAGNOSIS — C7800 Secondary malignant neoplasm of unspecified lung: Secondary | ICD-10-CM | POA: Diagnosis not present

## 2021-01-10 DIAGNOSIS — Z7189 Other specified counseling: Secondary | ICD-10-CM | POA: Diagnosis not present

## 2021-01-10 DIAGNOSIS — C61 Malignant neoplasm of prostate: Secondary | ICD-10-CM | POA: Diagnosis not present

## 2021-01-10 DIAGNOSIS — R069 Unspecified abnormalities of breathing: Secondary | ICD-10-CM | POA: Diagnosis not present

## 2021-01-10 DIAGNOSIS — F17220 Nicotine dependence, chewing tobacco, uncomplicated: Secondary | ICD-10-CM | POA: Diagnosis not present

## 2021-01-10 DIAGNOSIS — D61818 Other pancytopenia: Secondary | ICD-10-CM | POA: Diagnosis not present

## 2021-01-10 DIAGNOSIS — Z8546 Personal history of malignant neoplasm of prostate: Secondary | ICD-10-CM | POA: Diagnosis not present

## 2021-01-10 DIAGNOSIS — Z192 Hormone resistant malignancy status: Secondary | ICD-10-CM | POA: Diagnosis not present

## 2021-01-10 DIAGNOSIS — Z9221 Personal history of antineoplastic chemotherapy: Secondary | ICD-10-CM | POA: Diagnosis not present

## 2021-01-11 DIAGNOSIS — R531 Weakness: Secondary | ICD-10-CM | POA: Diagnosis not present

## 2021-01-11 DIAGNOSIS — R69 Illness, unspecified: Secondary | ICD-10-CM | POA: Diagnosis not present

## 2021-01-11 DIAGNOSIS — C61 Malignant neoplasm of prostate: Secondary | ICD-10-CM | POA: Diagnosis not present

## 2021-01-11 DIAGNOSIS — R0602 Shortness of breath: Secondary | ICD-10-CM | POA: Diagnosis not present

## 2021-01-11 DIAGNOSIS — C7951 Secondary malignant neoplasm of bone: Secondary | ICD-10-CM | POA: Diagnosis not present

## 2021-01-11 DIAGNOSIS — D649 Anemia, unspecified: Secondary | ICD-10-CM | POA: Diagnosis not present

## 2021-01-11 DIAGNOSIS — M542 Cervicalgia: Secondary | ICD-10-CM | POA: Diagnosis not present

## 2021-01-11 DIAGNOSIS — J90 Pleural effusion, not elsewhere classified: Secondary | ICD-10-CM | POA: Diagnosis not present

## 2021-01-11 DIAGNOSIS — J189 Pneumonia, unspecified organism: Secondary | ICD-10-CM | POA: Diagnosis not present

## 2021-01-12 ENCOUNTER — Ambulatory Visit (HOSPITAL_COMMUNITY): Payer: Medicare HMO

## 2021-01-12 ENCOUNTER — Telehealth: Payer: Self-pay | Admitting: *Deleted

## 2021-01-12 DIAGNOSIS — R911 Solitary pulmonary nodule: Secondary | ICD-10-CM | POA: Diagnosis not present

## 2021-01-12 DIAGNOSIS — M542 Cervicalgia: Secondary | ICD-10-CM | POA: Diagnosis not present

## 2021-01-12 DIAGNOSIS — Z192 Hormone resistant malignancy status: Secondary | ICD-10-CM | POA: Diagnosis not present

## 2021-01-12 DIAGNOSIS — R69 Illness, unspecified: Secondary | ICD-10-CM | POA: Diagnosis not present

## 2021-01-12 DIAGNOSIS — I10 Essential (primary) hypertension: Secondary | ICD-10-CM | POA: Diagnosis not present

## 2021-01-12 DIAGNOSIS — C61 Malignant neoplasm of prostate: Secondary | ICD-10-CM | POA: Diagnosis not present

## 2021-01-12 DIAGNOSIS — D61818 Other pancytopenia: Secondary | ICD-10-CM | POA: Diagnosis not present

## 2021-01-12 DIAGNOSIS — E559 Vitamin D deficiency, unspecified: Secondary | ICD-10-CM | POA: Diagnosis not present

## 2021-01-12 DIAGNOSIS — E538 Deficiency of other specified B group vitamins: Secondary | ICD-10-CM | POA: Diagnosis not present

## 2021-01-12 DIAGNOSIS — J9621 Acute and chronic respiratory failure with hypoxia: Secondary | ICD-10-CM | POA: Diagnosis not present

## 2021-01-12 DIAGNOSIS — M47812 Spondylosis without myelopathy or radiculopathy, cervical region: Secondary | ICD-10-CM | POA: Diagnosis not present

## 2021-01-12 DIAGNOSIS — J90 Pleural effusion, not elsewhere classified: Secondary | ICD-10-CM | POA: Diagnosis not present

## 2021-01-12 DIAGNOSIS — J189 Pneumonia, unspecified organism: Secondary | ICD-10-CM | POA: Diagnosis not present

## 2021-01-12 DIAGNOSIS — Z515 Encounter for palliative care: Secondary | ICD-10-CM | POA: Diagnosis not present

## 2021-01-12 DIAGNOSIS — J918 Pleural effusion in other conditions classified elsewhere: Secondary | ICD-10-CM | POA: Diagnosis not present

## 2021-01-12 DIAGNOSIS — C7951 Secondary malignant neoplasm of bone: Secondary | ICD-10-CM | POA: Diagnosis not present

## 2021-01-12 DIAGNOSIS — R918 Other nonspecific abnormal finding of lung field: Secondary | ICD-10-CM | POA: Diagnosis not present

## 2021-01-12 NOTE — Telephone Encounter (Signed)
Wife left message stating Gregory Schneider is in [facility] hospital since Saturday, thoracentesis is going to be done while he is admitted.   Next appt with Dr Alen Blew is 3/2

## 2021-01-13 DIAGNOSIS — G893 Neoplasm related pain (acute) (chronic): Secondary | ICD-10-CM | POA: Diagnosis not present

## 2021-01-13 DIAGNOSIS — R0602 Shortness of breath: Secondary | ICD-10-CM | POA: Diagnosis not present

## 2021-01-13 DIAGNOSIS — Z85830 Personal history of malignant neoplasm of bone: Secondary | ICD-10-CM | POA: Diagnosis not present

## 2021-01-13 DIAGNOSIS — Z8546 Personal history of malignant neoplasm of prostate: Secondary | ICD-10-CM | POA: Diagnosis not present

## 2021-01-13 DIAGNOSIS — Z7189 Other specified counseling: Secondary | ICD-10-CM | POA: Diagnosis not present

## 2021-01-13 DIAGNOSIS — D61818 Other pancytopenia: Secondary | ICD-10-CM | POA: Diagnosis not present

## 2021-01-13 DIAGNOSIS — C7951 Secondary malignant neoplasm of bone: Secondary | ICD-10-CM | POA: Diagnosis not present

## 2021-01-13 DIAGNOSIS — Z515 Encounter for palliative care: Secondary | ICD-10-CM | POA: Diagnosis not present

## 2021-01-13 DIAGNOSIS — R911 Solitary pulmonary nodule: Secondary | ICD-10-CM | POA: Diagnosis not present

## 2021-01-13 DIAGNOSIS — J918 Pleural effusion in other conditions classified elsewhere: Secondary | ICD-10-CM | POA: Diagnosis not present

## 2021-01-13 DIAGNOSIS — I10 Essential (primary) hypertension: Secondary | ICD-10-CM | POA: Diagnosis not present

## 2021-01-13 DIAGNOSIS — M542 Cervicalgia: Secondary | ICD-10-CM | POA: Diagnosis not present

## 2021-01-13 DIAGNOSIS — J9621 Acute and chronic respiratory failure with hypoxia: Secondary | ICD-10-CM | POA: Diagnosis not present

## 2021-01-13 DIAGNOSIS — Z192 Hormone resistant malignancy status: Secondary | ICD-10-CM | POA: Diagnosis not present

## 2021-01-13 DIAGNOSIS — J189 Pneumonia, unspecified organism: Secondary | ICD-10-CM | POA: Diagnosis not present

## 2021-01-13 DIAGNOSIS — J90 Pleural effusion, not elsewhere classified: Secondary | ICD-10-CM | POA: Diagnosis not present

## 2021-01-13 DIAGNOSIS — J810 Acute pulmonary edema: Secondary | ICD-10-CM | POA: Diagnosis not present

## 2021-01-13 DIAGNOSIS — C61 Malignant neoplasm of prostate: Secondary | ICD-10-CM | POA: Diagnosis not present

## 2021-01-13 DIAGNOSIS — Z9889 Other specified postprocedural states: Secondary | ICD-10-CM | POA: Diagnosis not present

## 2021-01-14 ENCOUNTER — Inpatient Hospital Stay

## 2021-01-14 ENCOUNTER — Inpatient Hospital Stay: Admitting: Oncology

## 2021-01-14 DIAGNOSIS — C7951 Secondary malignant neoplasm of bone: Secondary | ICD-10-CM | POA: Diagnosis not present

## 2021-01-14 DIAGNOSIS — J90 Pleural effusion, not elsewhere classified: Secondary | ICD-10-CM | POA: Diagnosis not present

## 2021-01-14 DIAGNOSIS — Z192 Hormone resistant malignancy status: Secondary | ICD-10-CM | POA: Diagnosis not present

## 2021-01-14 DIAGNOSIS — J189 Pneumonia, unspecified organism: Secondary | ICD-10-CM | POA: Diagnosis not present

## 2021-01-14 DIAGNOSIS — G893 Neoplasm related pain (acute) (chronic): Secondary | ICD-10-CM | POA: Diagnosis not present

## 2021-01-14 DIAGNOSIS — D61818 Other pancytopenia: Secondary | ICD-10-CM | POA: Diagnosis not present

## 2021-01-14 DIAGNOSIS — J9621 Acute and chronic respiratory failure with hypoxia: Secondary | ICD-10-CM | POA: Diagnosis not present

## 2021-01-14 DIAGNOSIS — M542 Cervicalgia: Secondary | ICD-10-CM | POA: Diagnosis not present

## 2021-01-14 DIAGNOSIS — C61 Malignant neoplasm of prostate: Secondary | ICD-10-CM | POA: Diagnosis not present

## 2021-01-14 DIAGNOSIS — Z515 Encounter for palliative care: Secondary | ICD-10-CM | POA: Diagnosis not present

## 2021-01-14 DIAGNOSIS — R911 Solitary pulmonary nodule: Secondary | ICD-10-CM | POA: Diagnosis not present

## 2021-01-14 DIAGNOSIS — R0602 Shortness of breath: Secondary | ICD-10-CM | POA: Diagnosis not present

## 2021-01-14 DIAGNOSIS — J918 Pleural effusion in other conditions classified elsewhere: Secondary | ICD-10-CM | POA: Diagnosis not present

## 2021-01-14 DIAGNOSIS — J810 Acute pulmonary edema: Secondary | ICD-10-CM | POA: Diagnosis not present

## 2021-01-16 ENCOUNTER — Telehealth: Payer: Self-pay

## 2021-01-16 NOTE — Telephone Encounter (Signed)
Patient's wife called to reschedule missed appointments from 01/14/21. Per Dr. Alen Blew patient to have lab, MD, and transfusion on 01/20/21 with first appointment at 8 am. Lab and MD visit scheduled but per the scheduler there are not any openings in infusion. Per the scheduler patient may be able to receive his transfusion at the [facility]. The scheduler will reach out to the scheduling department at [facility]. Patient's wife made aware and knows to expect a call from the scheduling department at [facility]. Patient's wife also made aware that if there are cancellations in the infusion room then patient will be able to receive his transfusion at the [facility].

## 2021-01-20 ENCOUNTER — Other Ambulatory Visit: Payer: Self-pay

## 2021-01-20 ENCOUNTER — Inpatient Hospital Stay

## 2021-01-20 ENCOUNTER — Inpatient Hospital Stay: Attending: Oncology | Admitting: Oncology

## 2021-01-20 VITALS — BP 112/63 | HR 105 | Temp 96.1°F | Resp 19 | Ht 74.0 in | Wt 289.2 lb

## 2021-01-20 DIAGNOSIS — C7951 Secondary malignant neoplasm of bone: Secondary | ICD-10-CM | POA: Diagnosis not present

## 2021-01-20 DIAGNOSIS — D630 Anemia in neoplastic disease: Secondary | ICD-10-CM | POA: Diagnosis not present

## 2021-01-20 DIAGNOSIS — C61 Malignant neoplasm of prostate: Secondary | ICD-10-CM | POA: Insufficient documentation

## 2021-01-20 DIAGNOSIS — R0600 Dyspnea, unspecified: Secondary | ICD-10-CM

## 2021-01-20 DIAGNOSIS — D638 Anemia in other chronic diseases classified elsewhere: Secondary | ICD-10-CM

## 2021-01-20 DIAGNOSIS — M898X9 Other specified disorders of bone, unspecified site: Secondary | ICD-10-CM | POA: Diagnosis not present

## 2021-01-20 DIAGNOSIS — R0609 Other forms of dyspnea: Secondary | ICD-10-CM

## 2021-01-20 LAB — SAMPLE TO BLOOD BANK

## 2021-01-20 LAB — CMP (CANCER CENTER ONLY)
ALT: 18 U/L (ref 0–44)
AST: 23 U/L (ref 15–41)
Albumin: 3.1 g/dL — ABNORMAL LOW (ref 3.5–5.0)
Alkaline Phosphatase: 144 U/L — ABNORMAL HIGH (ref 38–126)
Anion gap: 9 (ref 5–15)
BUN: 15 mg/dL (ref 8–23)
CO2: 33 mmol/L — ABNORMAL HIGH (ref 22–32)
Calcium: 8.9 mg/dL (ref 8.9–10.3)
Chloride: 94 mmol/L — ABNORMAL LOW (ref 98–111)
Creatinine: 0.64 mg/dL (ref 0.61–1.24)
GFR, Estimated: >60 mL/min (ref >60)
Glucose, Bld: 110 mg/dL — ABNORMAL HIGH (ref 70–99)
Potassium: 3.7 mmol/L (ref 3.5–5.1)
Sodium: 136 mmol/L (ref 135–145)
Total Bilirubin: 0.7 mg/dL (ref 0.3–1.2)
Total Protein: 6.5 g/dL (ref 6.5–8.1)

## 2021-01-20 LAB — CBC WITH DIFFERENTIAL (CANCER CENTER ONLY)
Abs Immature Granulocytes: 0.34 10*3/uL — ABNORMAL HIGH (ref 0.00–0.07)
Basophils Absolute: 0 10*3/uL (ref 0.0–0.1)
Basophils Relative: 1 %
Eosinophils Absolute: 0.1 10*3/uL (ref 0.0–0.5)
Eosinophils Relative: 2 %
HCT: 23.9 % — ABNORMAL LOW (ref 39.0–52.0)
Hemoglobin: 7.7 g/dL — ABNORMAL LOW (ref 13.0–17.0)
Immature Granulocytes: 9 %
Lymphocytes Relative: 15 %
Lymphs Abs: 0.6 10*3/uL — ABNORMAL LOW (ref 0.7–4.0)
MCH: 28.6 pg (ref 26.0–34.0)
MCHC: 32.2 g/dL (ref 30.0–36.0)
MCV: 88.8 fL (ref 80.0–100.0)
Monocytes Absolute: 0.3 10*3/uL (ref 0.1–1.0)
Monocytes Relative: 9 %
Neutro Abs: 2.5 10*3/uL (ref 1.7–7.7)
Neutrophils Relative %: 64 %
Platelet Count: 21 10*3/uL — ABNORMAL LOW (ref 150–400)
RBC: 2.69 MIL/uL — ABNORMAL LOW (ref 4.22–5.81)
RDW: 18 % — ABNORMAL HIGH (ref 11.5–15.5)
WBC Count: 3.9 10*3/uL — ABNORMAL LOW (ref 4.0–10.5)
nRBC: 4.3 % — ABNORMAL HIGH (ref 0.0–0.2)

## 2021-01-20 LAB — PREPARE RBC (CROSSMATCH)

## 2021-01-20 NOTE — Progress Notes (Signed)
Hematology and Oncology Follow Up Visit  Gregory Schneider [id number] June 28, 1956 65 y.o. 01/20/2021 8:21 AM   Principle Diagnosis: 65 year old man with prostate cancer diagnosed in 2013.  He has end-stage advanced disease at this time after exhausting multiple therapies.  Prior Therapy:  Combined androgen deprivation with Lupron and Casodex. He had a good response and then developed a rise in the PSA with castrate level testosterone.  He is S/P Bilateral simple orchiectomy done on 12/25/2012.  Provenge started on 03/03/12. Therapy Ended in 03/31/2012. Case Zytiga started in 10/2013. Therapy discontinued in January 2018 because of progression of disease. Xofigo infusion to start on Apr 13, 2018.  He completed 6 months of therapy in November 2019. Status post prostate radiation between December 19, 2018 and January 01, 2019.  He received total of 30 Gy in 10 fractions to the prostate and the pubic rami. Xtandi 160 mg daily started in February 2018.  Therapy discontinued in October 2020 because of progression of disease.  Taxotere chemotherapy started on September 13, 2019. He completed 9 cycles of therapy in May 2021.   Current therapy: Supportive care only.      Interim History:  Gregory Schneider returns today for a repeat follow-up.  Since the last visit, he was hospitalized at [facility] for symptoms of shortness of breath failure to thrive.  During his hospitalization he underwent CT scan of the chest which showed bilateral pleural effusion in addition to widespread metastatic disease including involvement of the cervical spine.  He underwent thoracentesis and was treated for presumed pneumonia and discharged on March 2 with a hemoglobin of 7.5 and platelet count of 16.  Since his discharge, he has been receiving hospice care with no major complaints at this time.  He is limited in his mobility and currently confined to a chair.  He denies any worsening shortness of breath or difficulty  breathing.  He denies any bleeding complications.     Medications: Unchanged on review. Current Outpatient Medications  Medication Sig Dispense Refill  . amLODipine (NORVASC) 2.5 MG tablet Take 2.5 mg by mouth daily.    . calcium carbonate (OS-CAL) 600 MG TABS Take 600 mg by mouth daily.    . cholecalciferol (VITAMIN D) 1000 UNITS tablet Take 1,000 Units by mouth daily.    . clonazePAM (KLONOPIN) 1 MG tablet Take 1 mg by mouth as needed for anxiety (1-2 tablets daily as needed).     . Cyanocobalamin 1000 MCG TBCR Take 1,000 mcg by mouth daily.    Marland Kitchen dabrafenib mesylate (TAFINLAR) 75 MG capsule Take 2 capsules (150 mg total) by mouth 2 (two) times daily. Take on an empty stomach 1 hour before or 2 hours after meals. (Patient not taking: Reported on 12/15/2020) 120 capsule 0  . fentaNYL (DURAGESIC) 75 MCG/HR Place 1 patch onto the skin every other day. 15 patch 0  . gabapentin (NEURONTIN) 300 MG capsule TAKE 1 CAPSULE AT BEDTIME (Patient taking differently: Take 900 mg by mouth 2 (two) times daily.) 90 capsule 3  . HYDROcodone-acetaminophen (NORCO/VICODIN) 5-325 MG tablet Take 1-2 tablets by mouth every 6 (six) hours as needed. 90 tablet 0  . HYDROmorphone (DILAUDID) 4 MG tablet Take 1 tablet (4 mg total) by mouth every 4 (four) hours as needed for severe pain. 60 tablet 0  . lidocaine-prilocaine (EMLA) cream Apply 1 application topically as needed. (Patient not taking: Reported on 12/15/2020) 30 g 0  . losartan (COZAAR) 100 MG tablet Take 100 mg by mouth  daily.    . morphine (MSIR) 15 MG tablet Take 1 tablet (15 mg total) by mouth every 4 (four) hours as needed for severe pain. (Patient not taking: Reported on 12/15/2020) 60 tablet 0  . Multiple Vitamin (MULTIVITAMIN) tablet Take 1 tablet by mouth daily.    . naproxen sodium (ALEVE) 220 MG tablet Take 440 mg by mouth 2 (two) times daily as needed (pain).    . OXYGEN Inhale 4-5 L into the lungs continuous.    . prochlorperazine (COMPAZINE) 10 MG  tablet Take 1 tablet (10 mg total) by mouth every 6 (six) hours as needed for nausea or vomiting. (Patient not taking: Reported on 12/15/2020) 30 tablet 3  . sertraline (ZOLOFT) 100 MG tablet Take 200 mg by mouth every morning.     . trametinib dimethyl sulfoxide (MEKINIST) 2 MG tablet Take 1 tablet (2 mg total) by mouth daily. Take 1 hour before or 2 hours after a meal. Store refrigerated in original container. (Patient not taking: Reported on 12/15/2020) 30 tablet 0  . traZODone (DESYREL) 50 MG tablet Take 50 mg by mouth at bedtime as needed for sleep.     No current facility-administered medications for this visit.   Facility-Administered Medications Ordered in Other Visits  Medication Dose Route Frequency Provider Last Rate Last Admin  . sodium chloride flush (NS) 0.9 % injection 10 mL  10 mL Intracatheter PRN Wyatt Portela, MD   10 mL at 07/25/20 1554    Allergies:  Allergies  Allergen Reactions  . Penicillins Other (See Comments)    Patient stated,"I don't remember what kind of reaction because I was a kid."      Physical Exam:       Blood pressure 112/63, pulse (!) 105, temperature (!) 96.1 F (35.6 C), temperature source Tympanic, resp. rate 19, height 6\' 2"  (1.88 m), weight 289 lb 3.2 oz (131.2 kg), SpO2 93 %.        ECOG: 2     General appearance: Comfortable appearing without any discomfort appeared chronically ill. Head: Normocephalic without any trauma Oropharynx: Mucous membranes are moist and pink without any thrush or ulcers. Eyes: Pupils are equal and round reactive to light. Lymph nodes: No cervical, supraclavicular, inguinal or axillary lymphadenopathy.   Heart:regular rate and rhythm.  S1 and S2 without leg edema. Lung: Clear without any rhonchi or wheezes.  No dullness to percussion. Abdomin: Soft, nontender, nondistended with good bowel sounds.  No hepatosplenomegaly. Musculoskeletal: No joint deformity or effusion.  Full range of motion  noted. Neurological: No deficits noted on motor, sensory and deep tendon reflex exam. Skin: No petechial rash or dryness.  Appeared moist.         .                 Lab Results: Lab Results  Component Value Date   WBC 3.4 (L) 12/30/2020   HGB 7.3 (L) 12/30/2020   HCT 21.7 (L) 12/30/2020   MCV 88.2 12/30/2020   PLT 25 (L) 12/30/2020     Chemistry      Component Value Date/Time   NA 139 12/30/2020 1033   NA 136 11/17/2017 0956   K 3.4 (L) 12/30/2020 1033   K 3.8 11/17/2017 0956   CL 100 12/30/2020 1033   CL 104 03/09/2013 0921   CO2 29 12/30/2020 1033   CO2 28 11/17/2017 0956   BUN 11 12/30/2020 1033   BUN 19.7 11/17/2017 0956   CREATININE 0.67 12/30/2020 1033  CREATININE 0.9 11/17/2017 0956      Component Value Date/Time   CALCIUM 8.5 (L) 12/30/2020 1033   CALCIUM 9.5 11/17/2017 0956   ALKPHOS 166 (H) 12/30/2020 1033   ALKPHOS 89 11/17/2017 0956   AST 25 12/30/2020 1033   AST 21 11/17/2017 0956   ALT 16 12/30/2020 1033   ALT 47 11/17/2017 0956   BILITOT 0.7 12/30/2020 1033   BILITOT 0.39 11/17/2017 0956                    Impression and Plan:    65 year old man with  1.  End-stage advanced prostate cancer with bone and bone marrow involvement after exhausting multiple therapies.    His disease status was updated at this time and he is currently receiving supportive care only.  No additional therapy is recommended other than supporting his symptoms.  He is currently under hospice care and no additional therapy is needed.    2.  Anemia: Due to malignancy and infiltrative prostate cancer in his bone marrow.  We will transfuse packed red cells for symptomatic relief.  His hemoglobin is 7.7 mildly symptomatic we will arrange for transfusion in the near future.    3.  Prognosis and goals of care: Prognosis is poor with limited life expectancy less than 3 months.  He is hospice eligible.  I recommended no invasive intervention  including cardiopulmonary resuscitation in case of any hospitalization.  4.  Pulmonary considerations in shortness of breath: He is status post thoracentesis with oxygen supplementation in place.   5  Bone pain: Currently on fentanyl and hydrocodone with pain is manageable    6. Follow up: He will follow every 2 weeks for back red cell transfusion.   30 minutes were spent on this visit.  The time was dedicated to reviewing disease status, reviewing laboratory data and discussing complications related to his malignancy and end-of-life discussion.    Zola Button MD 3/8/20228:21 AM

## 2021-01-20 NOTE — Progress Notes (Signed)
Patient scheduled for 2 units of PRBC on 01/21/2021 at 1000 in [facility]. Blood orders in and confirmed with blood bank [facility]. Blood pick up slip faxed to blood bank and [place].

## 2021-01-21 ENCOUNTER — Inpatient Hospital Stay

## 2021-01-21 DIAGNOSIS — C61 Malignant neoplasm of prostate: Secondary | ICD-10-CM | POA: Diagnosis not present

## 2021-01-21 DIAGNOSIS — D630 Anemia in neoplastic disease: Secondary | ICD-10-CM | POA: Diagnosis not present

## 2021-01-21 DIAGNOSIS — D638 Anemia in other chronic diseases classified elsewhere: Secondary | ICD-10-CM

## 2021-01-21 DIAGNOSIS — C7951 Secondary malignant neoplasm of bone: Secondary | ICD-10-CM | POA: Diagnosis not present

## 2021-01-21 DIAGNOSIS — M898X9 Other specified disorders of bone, unspecified site: Secondary | ICD-10-CM | POA: Diagnosis not present

## 2021-01-21 LAB — PROSTATE-SPECIFIC AG, SERUM (LABCORP): Prostate Specific Ag, Serum: 1033 ng/mL — ABNORMAL HIGH (ref 0.0–4.0)

## 2021-01-21 MED ORDER — ACETAMINOPHEN 325 MG PO TABS
650.0000 mg | ORAL_TABLET | Freq: Once | ORAL | Status: AC
  Administered 2021-01-21: 650 mg via ORAL

## 2021-01-21 MED ORDER — DIPHENHYDRAMINE HCL 25 MG PO CAPS
ORAL_CAPSULE | ORAL | Status: AC
  Filled 2021-01-21: qty 1

## 2021-01-21 MED ORDER — ACETAMINOPHEN 325 MG PO TABS
ORAL_TABLET | ORAL | Status: AC
  Filled 2021-01-21: qty 2

## 2021-01-21 MED ORDER — HEPARIN SOD (PORK) LOCK FLUSH 100 UNIT/ML IV SOLN
500.0000 [IU] | Freq: Every day | INTRAVENOUS | Status: AC | PRN
  Administered 2021-01-21: 500 [IU]
  Filled 2021-01-21: qty 5

## 2021-01-21 MED ORDER — DIPHENHYDRAMINE HCL 25 MG PO CAPS
25.0000 mg | ORAL_CAPSULE | Freq: Once | ORAL | Status: AC
  Administered 2021-01-21: 25 mg via ORAL

## 2021-01-21 MED ORDER — SODIUM CHLORIDE 0.9% IV SOLUTION
250.0000 mL | Freq: Once | INTRAVENOUS | Status: AC
  Administered 2021-01-21: 250 mL via INTRAVENOUS
  Filled 2021-01-21: qty 250

## 2021-01-21 MED ORDER — SODIUM CHLORIDE 0.9% FLUSH
10.0000 mL | INTRAVENOUS | Status: AC | PRN
  Administered 2021-01-21: 10 mL
  Filled 2021-01-21: qty 10

## 2021-01-21 NOTE — Patient Instructions (Signed)

## 2021-01-22 LAB — TYPE AND SCREEN
ABO/RH(D): B NEG
Antibody Screen: NEGATIVE
Unit division: 0
Unit division: 0

## 2021-01-22 LAB — BPAM RBC
Blood Product Expiration Date: 202204012359
Blood Product Expiration Date: 202204032359
ISSUE DATE / TIME: 202203090829
ISSUE DATE / TIME: 202203090829
Unit Type and Rh: 1700
Unit Type and Rh: 1700

## 2021-01-28 ENCOUNTER — Telehealth: Payer: Self-pay | Admitting: Oncology

## 2021-01-28 NOTE — Telephone Encounter (Signed)
Patient's transfusion appointments have been rescheduled, patient is notified of 03/21 and 03/22 appointments.

## 2021-02-02 ENCOUNTER — Inpatient Hospital Stay

## 2021-02-03 ENCOUNTER — Inpatient Hospital Stay

## 2021-02-05 ENCOUNTER — Other Ambulatory Visit: Payer: Medicare HMO

## 2021-02-13 DEATH — deceased

## 2021-02-19 ENCOUNTER — Other Ambulatory Visit: Payer: Medicare HMO

## 2021-03-05 ENCOUNTER — Other Ambulatory Visit: Payer: Medicare HMO

## 2021-03-19 ENCOUNTER — Other Ambulatory Visit: Payer: Medicare HMO

## 2021-03-19 ENCOUNTER — Ambulatory Visit: Payer: Medicare HMO | Admitting: Oncology

## 2021-04-11 IMAGING — CT CT CHEST W/O CM
2 of 3 series · 15 of 36 positions shown, 18 images · non-contrast
Comparison: 11/23/2012

CLINICAL DATA: Restaging prostate cancer.

EXAM:
CT CHEST WITHOUT CONTRAST
TECHNIQUE: Multidetector CT imaging of the chest was performed following the
standard protocol without IV contrast.

[Series 2: thorax · axial · 0.94mm/px · z∈[-435,-83]mm · 12 of 208 slices shown, 15 images]
[im 16/208  mediastinal]
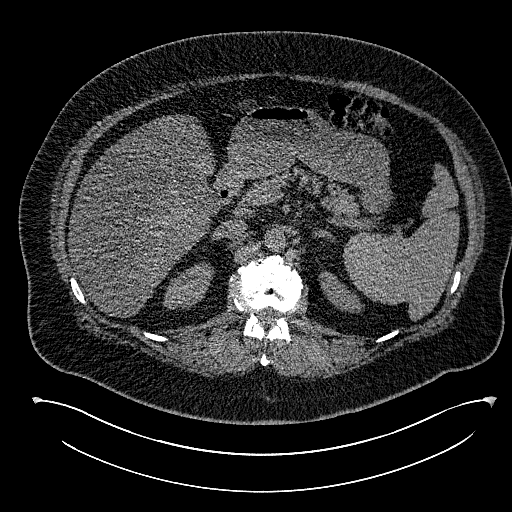
[im 16/208  lung]
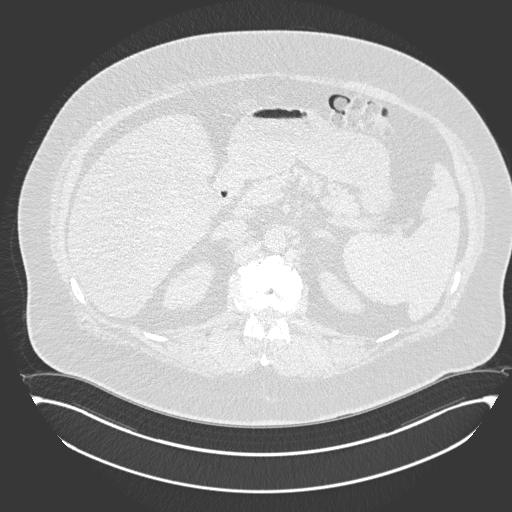
[im 31/208  lung]
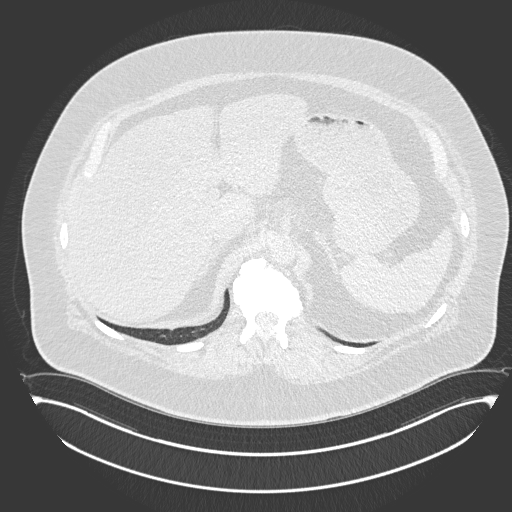
[im 47/208  lung]
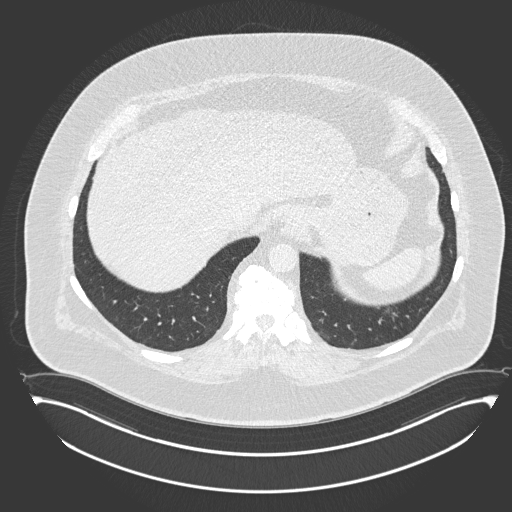
[im 62/208  lung]
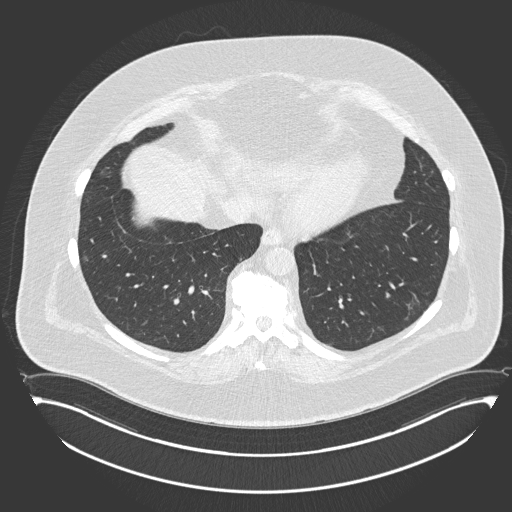
[im 77/208  mediastinal]
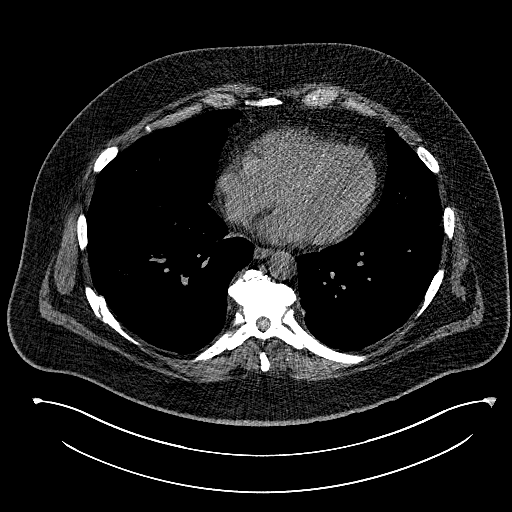
[im 77/208  lung]
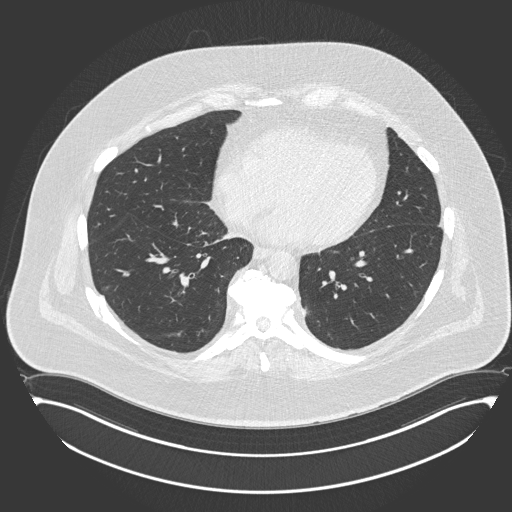
[im 93/208  lung]
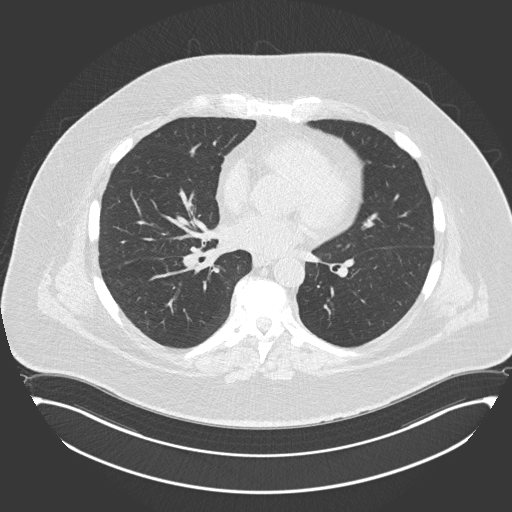
[im 116/208  lung]
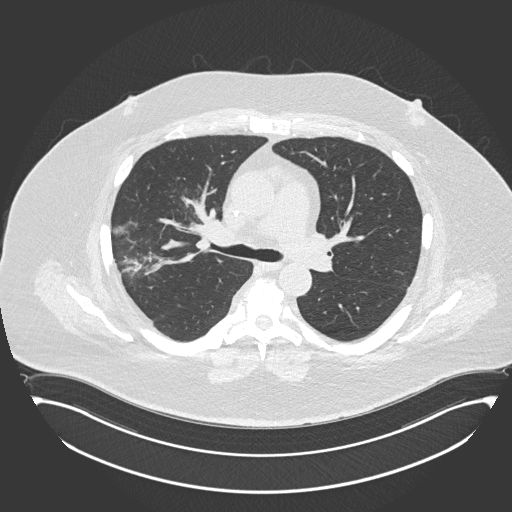
[im 131/208  lung]
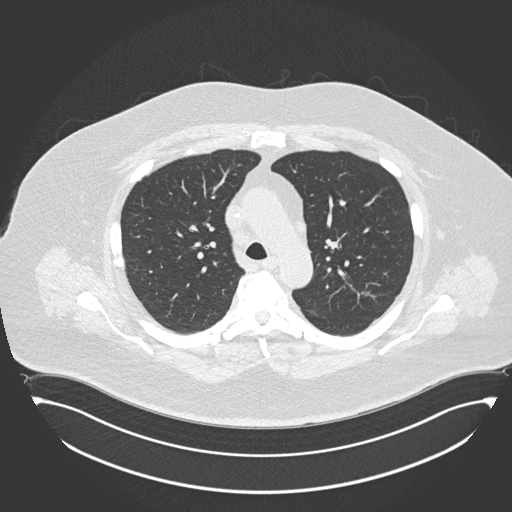
[im 146/208  mediastinal]
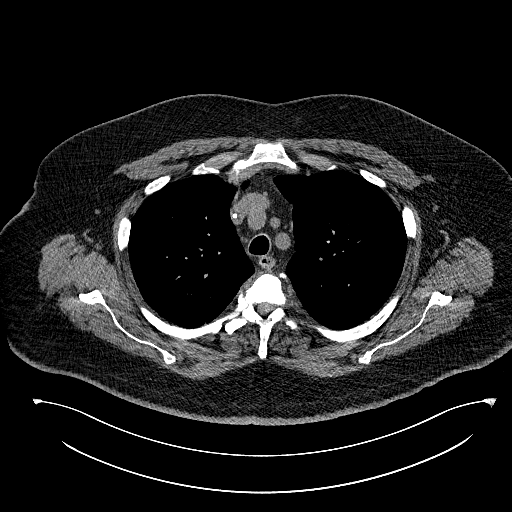
[im 146/208  lung]
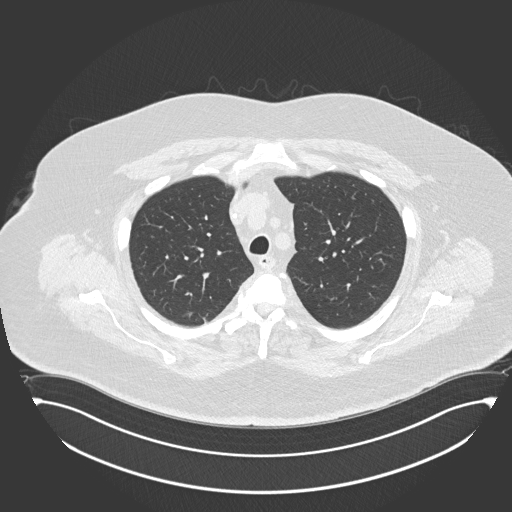
[im 162/208  lung]
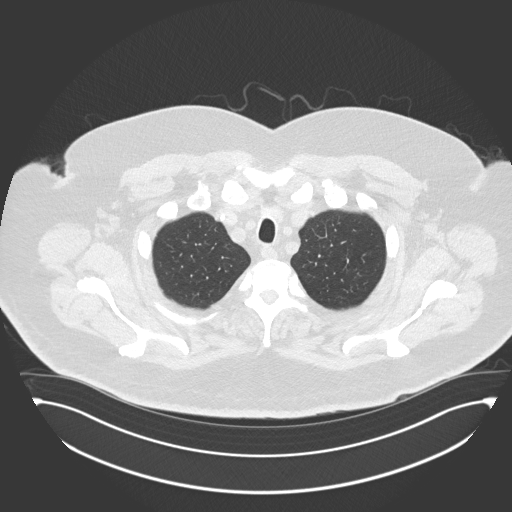
[im 177/208  lung]
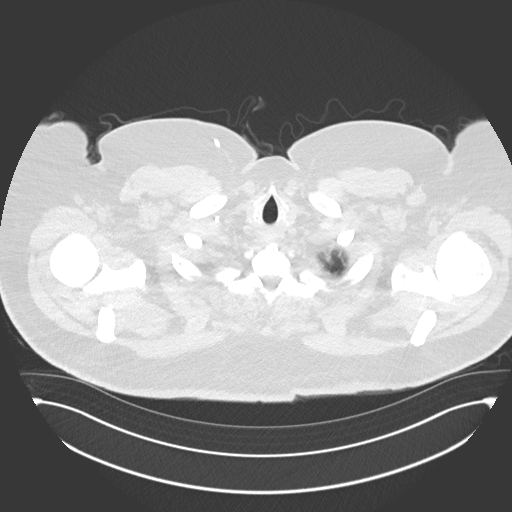
[im 192/208  lung]
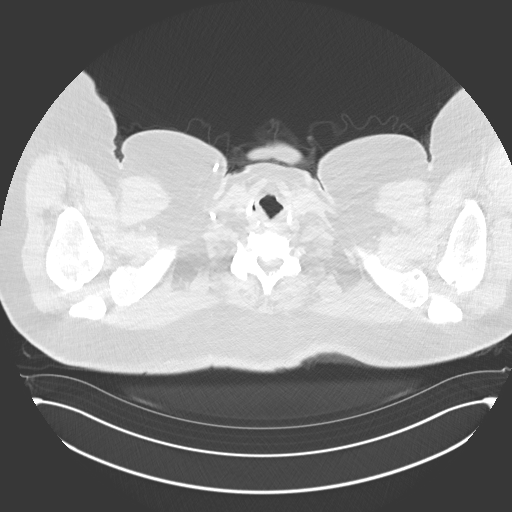

[Series 5: coronal · coronal · 0.81mm/px · 3 of 174 slices shown]
[im 35/174  lung]
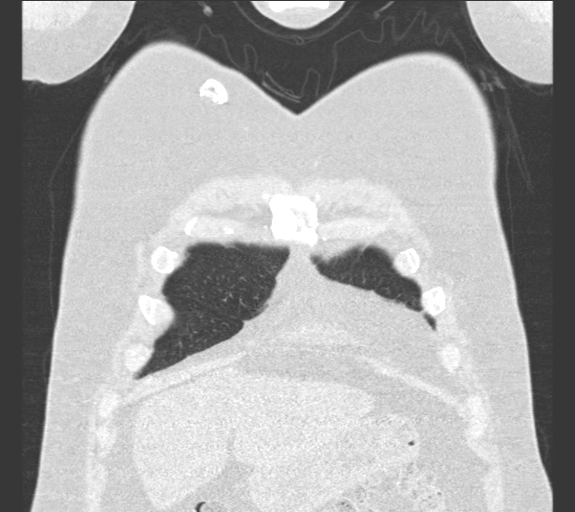
[im 70/174  lung]
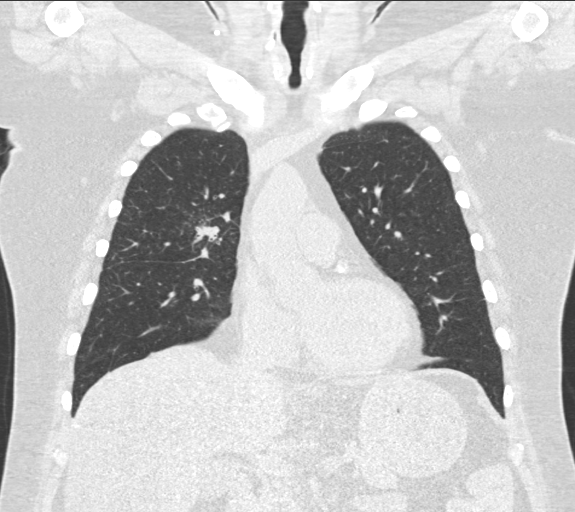
[im 104/174  lung]
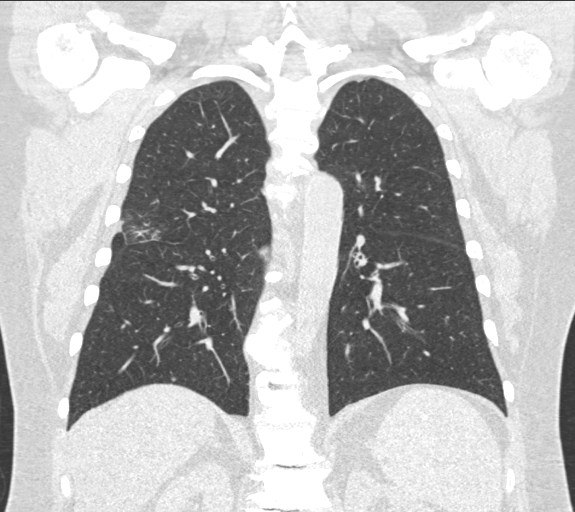

[15 of 36 positions shown; findings below may reference images not displayed]

FINDINGS: Cardiovascular: The heart size appears within normal limits. No
pericardial effusion identified. Aortic atherosclerosis. Coronary
artery atherosclerotic calcifications identified.

Mediastinum/Nodes: Normal appearance of the thyroid gland. The
trachea appears patent and is midline. Normal appearance of the
esophagus. No axillary or supraclavicular adenopathy. No enlarged
mediastinal lymph nodes. The hilar structures are suboptimally
evaluated due to lack of IV contrast material.

Lungs/Pleura: No pleural effusion. There is a discoid shaped nodule
within the posterior basal right upper lobe extending along the
fissure. This measures 3.1 x 1.7 by 1.2 cm, image 48/6. New from
11/23/2012. 6 mm nodule within the posterolateral right lower lobe
is identified, image 152/3. Not significantly changed from previous
exam.

Upper Abdomen: Hepatic steatosis. Left periaortic lymph node
measures 1 cm, image 202/2. Previously this measured 1 cm. No acute
abnormality noted within the imaged portions of the upper abdomen.

Musculoskeletal: Diffuse sclerotic bone metastases are identified.
Lesions are too numerous to count. When compared with the only chest
radiograph from 11/23/2012 there is been interval progression of
disease within the visualized osseous structures of the chest.
IMPRESSION: 1. There is a discoid shaped nodule within the posterior basal right
upper lobe extending along the fissure. This is new from 11/23/2012
the appearance is nonspecific. This may be inflammatory or
infectious in etiology. Neoplasm is not excluded.
2. Diffuse sclerotic bone metastases. Lesions are too numerous to
count. When compared with the only prior chest CT from 11/23/2012
there has been interval progression of disease within the visualized
osseous structures of the chest.
3. Borderline enlarged left periaortic lymph node is slightly
increased in size from previous exam.
4. Coronary artery calcifications noted.
5. Hepatic steatosis.
6. Aortic atherosclerosis.

Aortic Atherosclerosis ([id number]).

## 2021-06-05 ENCOUNTER — Other Ambulatory Visit: Payer: Medicare HMO

## 2021-06-05 ENCOUNTER — Ambulatory Visit: Payer: Medicare HMO | Admitting: Oncology

## 2021-07-15 IMAGING — DX DG CHEST 1V PORT
2 series · 2 of 2 positions shown · non-contrast
Comparison: Chest CT 08/11/2020.

CLINICAL DATA: Shortness of breath.

EXAM:
PORTABLE CHEST 1 VIEW

[chest ap (1 of 2)]
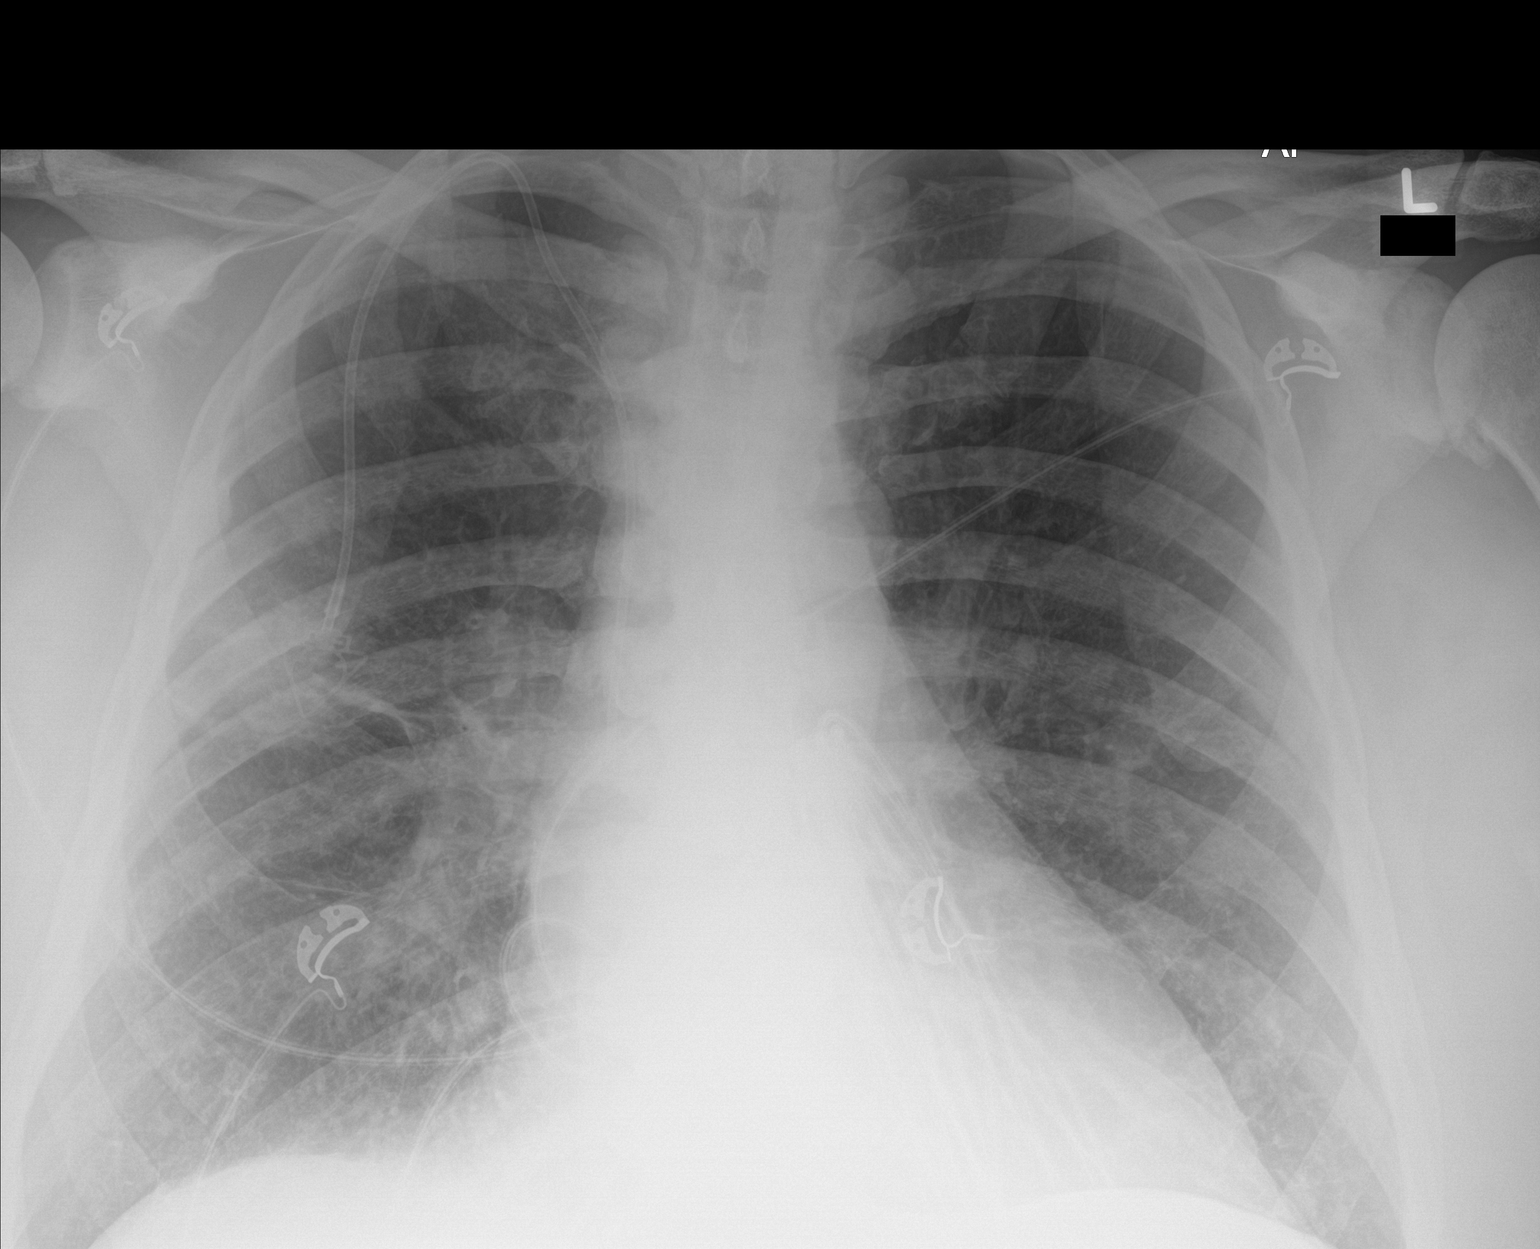

[chest ap (2 of 2)]
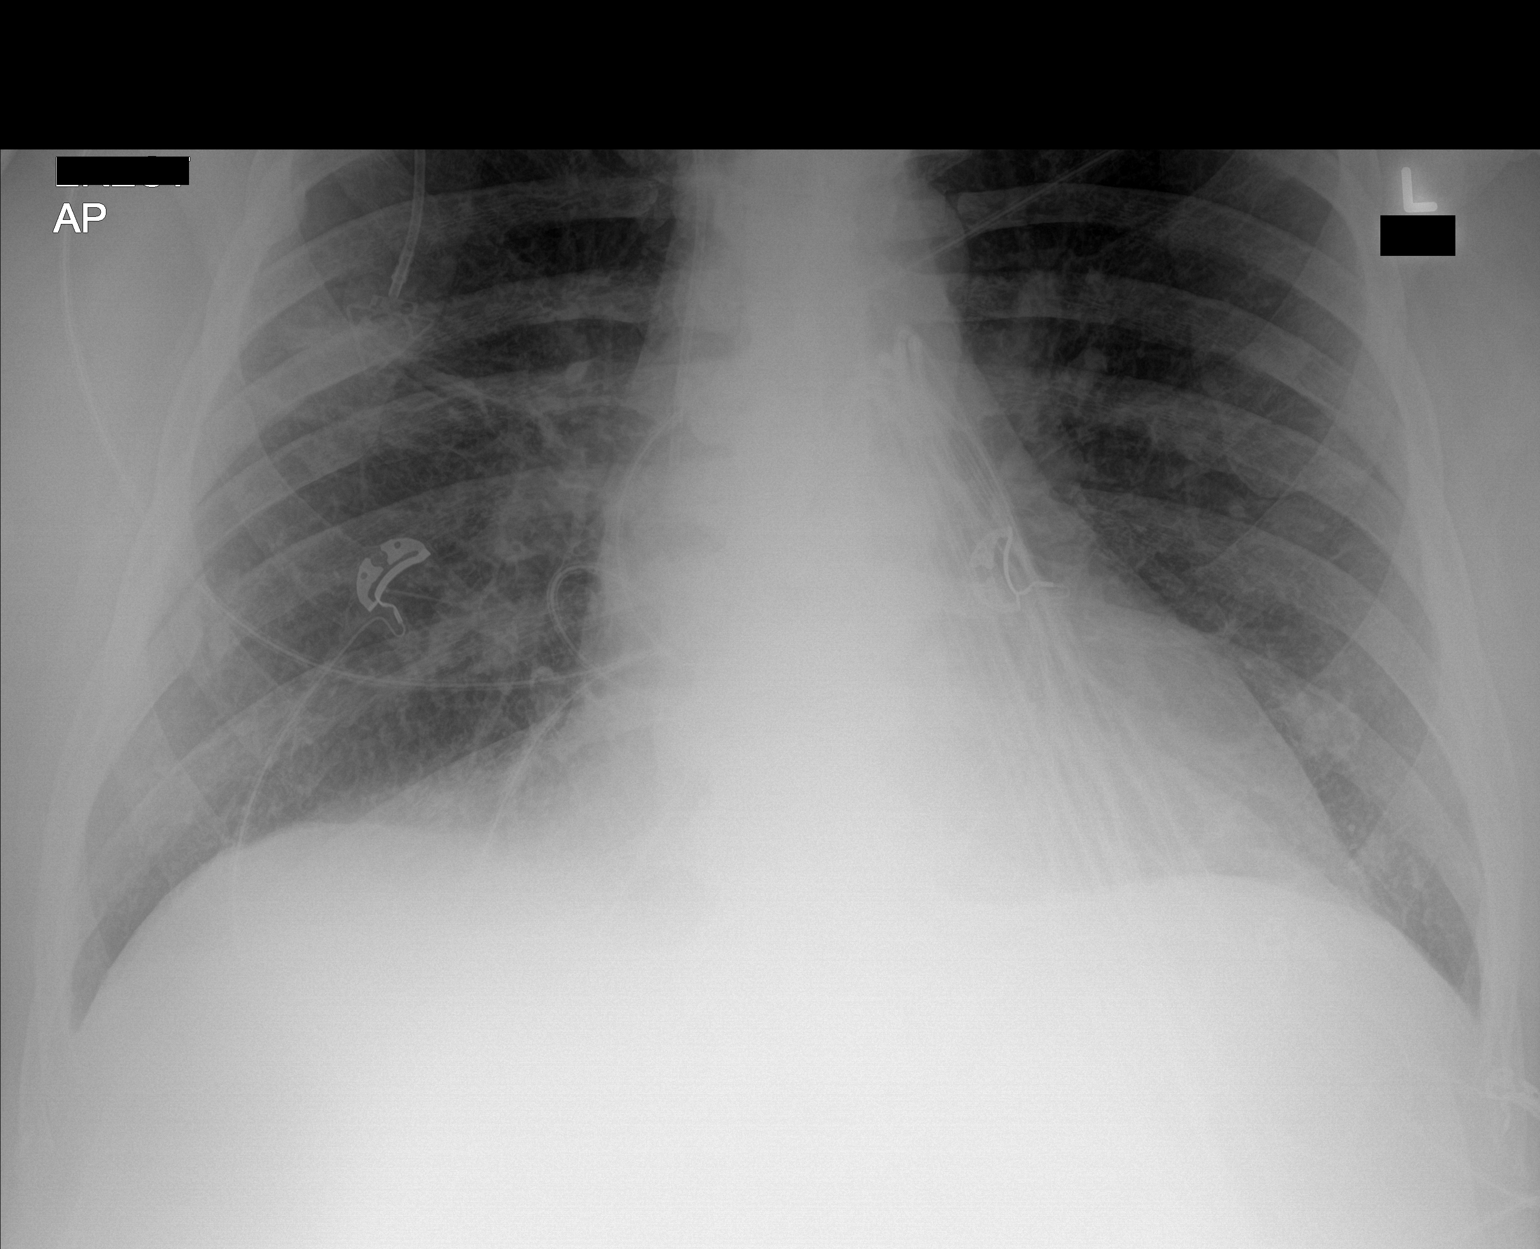

[2 of 2 positions shown; findings below may reference images not displayed]

FINDINGS: Right chest port in place. Upper normal heart size. Normal
mediastinal contours. Right midlung opacity is at site of nodule on
prior CT. There is peribronchial thickening. No confluent
consolidation. No pleural fluid or pneumothorax. Remote right rib
fractures. Patient's known sclerotic osseous metastatic disease is
better appreciated on CT.
IMPRESSION: 1. Upper normal heart size peribronchial thickening, may be related
to smoking or bronchitis.
2. Right midlung opacity at site of nodule on prior CT, nonspecific
radiographic appearance.
